# Patient Record
Sex: Male | Born: 1935 | Race: White | Hispanic: No | Smoking: Former smoker
Health system: Southern US, Community
[De-identification: ages and names within clinical notes are randomized; demographics above are authoritative.]

## PROBLEM LIST (undated history)

## (undated) DIAGNOSIS — J449 Chronic obstructive pulmonary disease, unspecified: Secondary | ICD-10-CM

## (undated) DIAGNOSIS — E785 Hyperlipidemia, unspecified: Secondary | ICD-10-CM

## (undated) DIAGNOSIS — E119 Type 2 diabetes mellitus without complications: Secondary | ICD-10-CM

## (undated) DIAGNOSIS — I10 Essential (primary) hypertension: Secondary | ICD-10-CM

## (undated) HISTORY — PX: HERNIA REPAIR: SHX51

---

## 2000-06-17 DIAGNOSIS — I2510 Atherosclerotic heart disease of native coronary artery without angina pectoris: Secondary | ICD-10-CM | POA: Insufficient documentation

## 2000-06-17 DIAGNOSIS — I10 Essential (primary) hypertension: Secondary | ICD-10-CM | POA: Insufficient documentation

## 2002-06-20 DIAGNOSIS — J309 Allergic rhinitis, unspecified: Secondary | ICD-10-CM | POA: Insufficient documentation

## 2002-06-20 DIAGNOSIS — J45909 Unspecified asthma, uncomplicated: Secondary | ICD-10-CM | POA: Insufficient documentation

## 2002-06-20 DIAGNOSIS — J4540 Moderate persistent asthma, uncomplicated: Secondary | ICD-10-CM | POA: Insufficient documentation

## 2004-02-25 ENCOUNTER — Ambulatory Visit: Payer: Medicare Other | Admitting: Family Medicine

## 2009-08-14 ENCOUNTER — Encounter: Payer: Medicare Other | Admitting: Orthopedic Surgery

## 2009-09-11 ENCOUNTER — Encounter: Payer: Medicare Other | Admitting: Orthopedic Surgery

## 2009-10-11 ENCOUNTER — Encounter: Payer: Medicare Other | Admitting: Orthopedic Surgery

## 2009-11-11 ENCOUNTER — Encounter: Payer: Medicare Other | Admitting: Orthopedic Surgery

## 2011-03-31 DIAGNOSIS — M545 Low back pain, unspecified: Secondary | ICD-10-CM | POA: Insufficient documentation

## 2011-04-10 DIAGNOSIS — E1165 Type 2 diabetes mellitus with hyperglycemia: Secondary | ICD-10-CM | POA: Insufficient documentation

## 2011-04-10 DIAGNOSIS — H52209 Unspecified astigmatism, unspecified eye: Secondary | ICD-10-CM | POA: Insufficient documentation

## 2011-04-10 DIAGNOSIS — IMO0002 Reserved for concepts with insufficient information to code with codable children: Secondary | ICD-10-CM | POA: Insufficient documentation

## 2011-04-10 DIAGNOSIS — H2510 Age-related nuclear cataract, unspecified eye: Secondary | ICD-10-CM | POA: Insufficient documentation

## 2011-05-25 DIAGNOSIS — K5730 Diverticulosis of large intestine without perforation or abscess without bleeding: Secondary | ICD-10-CM | POA: Insufficient documentation

## 2011-05-25 DIAGNOSIS — Z86010 Personal history of colonic polyps: Secondary | ICD-10-CM | POA: Insufficient documentation

## 2011-07-13 DIAGNOSIS — M5414 Radiculopathy, thoracic region: Secondary | ICD-10-CM | POA: Insufficient documentation

## 2011-07-13 DIAGNOSIS — R262 Difficulty in walking, not elsewhere classified: Secondary | ICD-10-CM | POA: Insufficient documentation

## 2011-07-17 DIAGNOSIS — M4712 Other spondylosis with myelopathy, cervical region: Secondary | ICD-10-CM | POA: Insufficient documentation

## 2011-07-27 DIAGNOSIS — M4802 Spinal stenosis, cervical region: Secondary | ICD-10-CM | POA: Insufficient documentation

## 2011-08-25 DIAGNOSIS — E7800 Pure hypercholesterolemia, unspecified: Secondary | ICD-10-CM | POA: Insufficient documentation

## 2011-10-27 DIAGNOSIS — M5412 Radiculopathy, cervical region: Secondary | ICD-10-CM | POA: Insufficient documentation

## 2011-12-10 DIAGNOSIS — M546 Pain in thoracic spine: Secondary | ICD-10-CM | POA: Insufficient documentation

## 2011-12-10 DIAGNOSIS — M47817 Spondylosis without myelopathy or radiculopathy, lumbosacral region: Secondary | ICD-10-CM | POA: Insufficient documentation

## 2012-01-12 DIAGNOSIS — M25559 Pain in unspecified hip: Secondary | ICD-10-CM | POA: Insufficient documentation

## 2012-03-17 DIAGNOSIS — Z981 Arthrodesis status: Secondary | ICD-10-CM | POA: Insufficient documentation

## 2012-03-17 DIAGNOSIS — E669 Obesity, unspecified: Secondary | ICD-10-CM | POA: Insufficient documentation

## 2012-03-17 DIAGNOSIS — M169 Osteoarthritis of hip, unspecified: Secondary | ICD-10-CM | POA: Insufficient documentation

## 2012-03-17 DIAGNOSIS — M48061 Spinal stenosis, lumbar region without neurogenic claudication: Secondary | ICD-10-CM | POA: Insufficient documentation

## 2012-05-10 DIAGNOSIS — Z789 Other specified health status: Secondary | ICD-10-CM | POA: Insufficient documentation

## 2012-05-10 DIAGNOSIS — F329 Major depressive disorder, single episode, unspecified: Secondary | ICD-10-CM | POA: Insufficient documentation

## 2012-05-10 DIAGNOSIS — F32A Depression, unspecified: Secondary | ICD-10-CM | POA: Insufficient documentation

## 2012-05-10 DIAGNOSIS — Z7289 Other problems related to lifestyle: Secondary | ICD-10-CM | POA: Insufficient documentation

## 2012-06-16 DIAGNOSIS — Z961 Presence of intraocular lens: Secondary | ICD-10-CM | POA: Insufficient documentation

## 2012-06-16 DIAGNOSIS — Z9849 Cataract extraction status, unspecified eye: Secondary | ICD-10-CM | POA: Insufficient documentation

## 2012-07-07 DIAGNOSIS — F1290 Cannabis use, unspecified, uncomplicated: Secondary | ICD-10-CM | POA: Insufficient documentation

## 2012-07-07 DIAGNOSIS — M5410 Radiculopathy, site unspecified: Secondary | ICD-10-CM | POA: Insufficient documentation

## 2012-07-07 DIAGNOSIS — F1210 Cannabis abuse, uncomplicated: Secondary | ICD-10-CM | POA: Insufficient documentation

## 2012-07-08 DIAGNOSIS — M47816 Spondylosis without myelopathy or radiculopathy, lumbar region: Secondary | ICD-10-CM | POA: Insufficient documentation

## 2012-08-15 DIAGNOSIS — B351 Tinea unguium: Secondary | ICD-10-CM | POA: Insufficient documentation

## 2012-08-15 DIAGNOSIS — E669 Obesity, unspecified: Secondary | ICD-10-CM | POA: Insufficient documentation

## 2012-08-15 DIAGNOSIS — L602 Onychogryphosis: Secondary | ICD-10-CM | POA: Insufficient documentation

## 2012-08-15 DIAGNOSIS — E1169 Type 2 diabetes mellitus with other specified complication: Secondary | ICD-10-CM | POA: Insufficient documentation

## 2012-09-20 ENCOUNTER — Ambulatory Visit: Payer: Self-pay | Admitting: Family Medicine

## 2012-09-25 DIAGNOSIS — L0390 Cellulitis, unspecified: Secondary | ICD-10-CM | POA: Insufficient documentation

## 2012-09-25 DIAGNOSIS — G4730 Sleep apnea, unspecified: Secondary | ICD-10-CM | POA: Insufficient documentation

## 2012-09-25 DIAGNOSIS — J449 Chronic obstructive pulmonary disease, unspecified: Secondary | ICD-10-CM | POA: Insufficient documentation

## 2012-11-23 DIAGNOSIS — R45851 Suicidal ideations: Secondary | ICD-10-CM | POA: Insufficient documentation

## 2012-11-23 DIAGNOSIS — M791 Myalgia, unspecified site: Secondary | ICD-10-CM | POA: Insufficient documentation

## 2013-01-03 DIAGNOSIS — Z79899 Other long term (current) drug therapy: Secondary | ICD-10-CM | POA: Insufficient documentation

## 2013-01-03 DIAGNOSIS — Z7901 Long term (current) use of anticoagulants: Secondary | ICD-10-CM | POA: Insufficient documentation

## 2013-03-03 ENCOUNTER — Ambulatory Visit: Payer: Self-pay | Admitting: Family Medicine

## 2013-04-02 ENCOUNTER — Ambulatory Visit: Payer: Self-pay | Admitting: Family Medicine

## 2013-05-24 DIAGNOSIS — M79641 Pain in right hand: Secondary | ICD-10-CM | POA: Insufficient documentation

## 2013-05-24 DIAGNOSIS — M79642 Pain in left hand: Secondary | ICD-10-CM

## 2013-07-21 DIAGNOSIS — M25519 Pain in unspecified shoulder: Secondary | ICD-10-CM | POA: Insufficient documentation

## 2013-09-18 DIAGNOSIS — F1190 Opioid use, unspecified, uncomplicated: Secondary | ICD-10-CM | POA: Insufficient documentation

## 2013-11-30 ENCOUNTER — Inpatient Hospital Stay (HOSPITAL_COMMUNITY)
Admission: EM | Admit: 2013-11-30 | Discharge: 2013-12-07 | DRG: 308 | Disposition: A | Discharge status: Home Health | Payer: Medicare Other | Attending: Internal Medicine | Admitting: Internal Medicine

## 2013-11-30 ENCOUNTER — Emergency Department (HOSPITAL_COMMUNITY): Payer: Medicare Other

## 2013-11-30 DIAGNOSIS — I10 Essential (primary) hypertension: Secondary | ICD-10-CM

## 2013-11-30 DIAGNOSIS — R4182 Altered mental status, unspecified: Secondary | ICD-10-CM

## 2013-11-30 DIAGNOSIS — N401 Enlarged prostate with lower urinary tract symptoms: Secondary | ICD-10-CM | POA: Diagnosis present

## 2013-11-30 DIAGNOSIS — G4733 Obstructive sleep apnea (adult) (pediatric): Secondary | ICD-10-CM | POA: Diagnosis present

## 2013-11-30 DIAGNOSIS — D649 Anemia, unspecified: Secondary | ICD-10-CM | POA: Diagnosis present

## 2013-11-30 DIAGNOSIS — G9341 Metabolic encephalopathy: Secondary | ICD-10-CM | POA: Diagnosis present

## 2013-11-30 DIAGNOSIS — J4489 Other specified chronic obstructive pulmonary disease: Secondary | ICD-10-CM | POA: Diagnosis present

## 2013-11-30 DIAGNOSIS — N138 Other obstructive and reflux uropathy: Secondary | ICD-10-CM | POA: Diagnosis present

## 2013-11-30 DIAGNOSIS — E119 Type 2 diabetes mellitus without complications: Secondary | ICD-10-CM | POA: Diagnosis present

## 2013-11-30 DIAGNOSIS — F0390 Unspecified dementia without behavioral disturbance: Secondary | ICD-10-CM | POA: Diagnosis present

## 2013-11-30 DIAGNOSIS — R0902 Hypoxemia: Secondary | ICD-10-CM

## 2013-11-30 DIAGNOSIS — R32 Unspecified urinary incontinence: Secondary | ICD-10-CM | POA: Diagnosis present

## 2013-11-30 DIAGNOSIS — IMO0001 Reserved for inherently not codable concepts without codable children: Secondary | ICD-10-CM | POA: Diagnosis present

## 2013-11-30 DIAGNOSIS — I509 Heart failure, unspecified: Secondary | ICD-10-CM | POA: Diagnosis not present

## 2013-11-30 DIAGNOSIS — E785 Hyperlipidemia, unspecified: Secondary | ICD-10-CM | POA: Diagnosis present

## 2013-11-30 DIAGNOSIS — R05 Cough: Secondary | ICD-10-CM

## 2013-11-30 DIAGNOSIS — I472 Ventricular tachycardia, unspecified: Principal | ICD-10-CM | POA: Diagnosis present

## 2013-11-30 DIAGNOSIS — E1139 Type 2 diabetes mellitus with other diabetic ophthalmic complication: Secondary | ICD-10-CM

## 2013-11-30 DIAGNOSIS — I5033 Acute on chronic diastolic (congestive) heart failure: Secondary | ICD-10-CM

## 2013-11-30 DIAGNOSIS — E1165 Type 2 diabetes mellitus with hyperglycemia: Secondary | ICD-10-CM

## 2013-11-30 DIAGNOSIS — I5031 Acute diastolic (congestive) heart failure: Secondary | ICD-10-CM

## 2013-11-30 DIAGNOSIS — R509 Fever, unspecified: Secondary | ICD-10-CM

## 2013-11-30 DIAGNOSIS — M6282 Rhabdomyolysis: Secondary | ICD-10-CM

## 2013-11-30 DIAGNOSIS — Z6836 Body mass index (BMI) 36.0-36.9, adult: Secondary | ICD-10-CM

## 2013-11-30 DIAGNOSIS — D696 Thrombocytopenia, unspecified: Secondary | ICD-10-CM | POA: Diagnosis present

## 2013-11-30 DIAGNOSIS — E662 Morbid (severe) obesity with alveolar hypoventilation: Secondary | ICD-10-CM | POA: Diagnosis present

## 2013-11-30 DIAGNOSIS — I4729 Other ventricular tachycardia: Secondary | ICD-10-CM | POA: Diagnosis not present

## 2013-11-30 DIAGNOSIS — J449 Chronic obstructive pulmonary disease, unspecified: Secondary | ICD-10-CM | POA: Diagnosis present

## 2013-11-30 DIAGNOSIS — E876 Hypokalemia: Secondary | ICD-10-CM

## 2013-11-30 DIAGNOSIS — R6510 Systemic inflammatory response syndrome (SIRS) of non-infectious origin without acute organ dysfunction: Secondary | ICD-10-CM

## 2013-11-30 DIAGNOSIS — Z791 Long term (current) use of non-steroidal anti-inflammatories (NSAID): Secondary | ICD-10-CM

## 2013-11-30 DIAGNOSIS — Z794 Long term (current) use of insulin: Secondary | ICD-10-CM

## 2013-11-30 DIAGNOSIS — I499 Cardiac arrhythmia, unspecified: Secondary | ICD-10-CM

## 2013-11-30 DIAGNOSIS — R059 Cough, unspecified: Secondary | ICD-10-CM

## 2013-11-30 HISTORY — DX: Hyperlipidemia, unspecified: E78.5

## 2013-11-30 HISTORY — DX: Chronic obstructive pulmonary disease, unspecified: J44.9

## 2013-11-30 HISTORY — DX: Essential (primary) hypertension: I10

## 2013-11-30 HISTORY — DX: Type 2 diabetes mellitus without complications: E11.9

## 2013-11-30 LAB — URINALYSIS, ROUTINE W REFLEX MICROSCOPIC
Bilirubin Urine: NEGATIVE
Glucose, UA: NEGATIVE mg/dL
Ketones, ur: NEGATIVE mg/dL
LEUKOCYTES UA: NEGATIVE
NITRITE: NEGATIVE
Protein, ur: 30 mg/dL — AB
SPECIFIC GRAVITY, URINE: 1.016 (ref 1.005–1.030)
UROBILINOGEN UA: 0.2 mg/dL (ref 0.0–1.0)
pH: 7 (ref 5.0–8.0)

## 2013-11-30 LAB — COMPREHENSIVE METABOLIC PANEL
ALBUMIN: 3.6 g/dL (ref 3.5–5.2)
ALK PHOS: 83 U/L (ref 39–117)
ALT: 18 U/L (ref 0–53)
AST: 42 U/L — AB (ref 0–37)
Anion gap: 12 (ref 5–15)
BUN: 14 mg/dL (ref 6–23)
CO2: 28 mEq/L (ref 19–32)
Calcium: 9 mg/dL (ref 8.4–10.5)
Chloride: 100 mEq/L (ref 96–112)
Creatinine, Ser: 1.04 mg/dL (ref 0.50–1.35)
GFR calc Af Amer: 79 mL/min — ABNORMAL LOW (ref >90)
GFR calc non Af Amer: 68 mL/min — ABNORMAL LOW (ref >90)
GLUCOSE: 149 mg/dL — AB (ref 70–99)
POTASSIUM: 2.8 meq/L — AB (ref 3.7–5.3)
Sodium: 140 mEq/L (ref 137–147)
TOTAL PROTEIN: 6.8 g/dL (ref 6.0–8.3)
Total Bilirubin: 0.6 mg/dL (ref 0.3–1.2)

## 2013-11-30 LAB — I-STAT ARTERIAL BLOOD GAS, ED
Acid-Base Excess: 3 mmol/L — ABNORMAL HIGH (ref 0.0–2.0)
Bicarbonate: 26.6 mEq/L — ABNORMAL HIGH (ref 20.0–24.0)
O2 Saturation: 89 %
PH ART: 7.468 — AB (ref 7.350–7.450)
PO2 ART: 54 mmHg — AB (ref 80.0–100.0)
TCO2: 28 mmol/L (ref 0–100)
pCO2 arterial: 36.7 mmHg (ref 35.0–45.0)

## 2013-11-30 LAB — URINE MICROSCOPIC-ADD ON

## 2013-11-30 LAB — CBC WITH DIFFERENTIAL/PLATELET
BASOS PCT: 0 % (ref 0–1)
Basophils Absolute: 0 10*3/uL (ref 0.0–0.1)
Eosinophils Absolute: 0 10*3/uL (ref 0.0–0.7)
Eosinophils Relative: 0 % (ref 0–5)
HCT: 40.1 % (ref 39.0–52.0)
Hemoglobin: 13.1 g/dL (ref 13.0–17.0)
LYMPHS ABS: 0.8 10*3/uL (ref 0.7–4.0)
Lymphocytes Relative: 6 % — ABNORMAL LOW (ref 12–46)
MCH: 30.4 pg (ref 26.0–34.0)
MCHC: 32.7 g/dL (ref 30.0–36.0)
MCV: 93 fL (ref 78.0–100.0)
Monocytes Absolute: 0.4 10*3/uL (ref 0.1–1.0)
Monocytes Relative: 3 % (ref 3–12)
NEUTROS PCT: 91 % — AB (ref 43–77)
Neutro Abs: 12.1 10*3/uL — ABNORMAL HIGH (ref 1.7–7.7)
PLATELETS: 123 10*3/uL — AB (ref 150–400)
RBC: 4.31 MIL/uL (ref 4.22–5.81)
RDW: 13.9 % (ref 11.5–15.5)
WBC: 13.3 10*3/uL — ABNORMAL HIGH (ref 4.0–10.5)

## 2013-11-30 LAB — RAPID URINE DRUG SCREEN, HOSP PERFORMED
Amphetamines: NOT DETECTED
BARBITURATES: NOT DETECTED
Benzodiazepines: NOT DETECTED
COCAINE: NOT DETECTED
Opiates: NOT DETECTED
Tetrahydrocannabinol: NOT DETECTED

## 2013-11-30 LAB — CK: Total CK: 1384 U/L — ABNORMAL HIGH (ref 7–232)

## 2013-11-30 LAB — ETHANOL: Alcohol, Ethyl (B): <11 mg/dL (ref 0–11)

## 2013-11-30 LAB — AMMONIA: Ammonia: 33 umol/L (ref 11–60)

## 2013-11-30 LAB — ACETAMINOPHEN LEVEL: Acetaminophen (Tylenol), Serum: <15 ug/mL (ref 10–30)

## 2013-11-30 LAB — I-STAT TROPONIN, ED: Troponin i, poc: 0.01 ng/mL (ref 0.00–0.08)

## 2013-11-30 LAB — I-STAT CG4 LACTIC ACID, ED: LACTIC ACID, VENOUS: 0.96 mmol/L (ref 0.5–2.2)

## 2013-11-30 MED ORDER — SODIUM CHLORIDE 0.9 % IV BOLUS (SEPSIS)
30.0000 mL/kg | Freq: Once | INTRAVENOUS | Status: AC
  Administered 2013-11-30: 500 mL via INTRAVENOUS

## 2013-11-30 MED ORDER — SODIUM CHLORIDE 0.9 % IV BOLUS (SEPSIS)
500.0000 mL | Freq: Once | INTRAVENOUS | Status: AC
  Administered 2013-11-30: 500 mL via INTRAVENOUS

## 2013-11-30 MED ORDER — ACETAMINOPHEN 650 MG RE SUPP
650.0000 mg | Freq: Once | RECTAL | Status: AC
  Administered 2013-11-30: 650 mg via RECTAL
  Filled 2013-11-30: qty 1

## 2013-11-30 MED ORDER — ALBUTEROL SULFATE (2.5 MG/3ML) 0.083% IN NEBU
5.0000 mg | INHALATION_SOLUTION | Freq: Once | RESPIRATORY_TRACT | Status: AC
  Administered 2013-12-01: 5 mg via RESPIRATORY_TRACT
  Filled 2013-11-30: qty 6

## 2013-11-30 MED ORDER — SODIUM CHLORIDE 0.9 % IV SOLN
1000.0000 mL | INTRAVENOUS | Status: DC
  Administered 2013-11-30: 1000 mL via INTRAVENOUS

## 2013-11-30 MED ORDER — POTASSIUM CHLORIDE 10 MEQ/100ML IV SOLN
10.0000 meq | INTRAVENOUS | Status: AC
  Administered 2013-11-30 – 2013-12-01 (×2): 10 meq via INTRAVENOUS
  Filled 2013-11-30 (×2): qty 100

## 2013-11-30 NOTE — ED Provider Notes (Signed)
CSN: [id number]     Arrival date & time 11/30/13  2047 History   First MD Initiated Contact with Patient 11/30/13 2057     Chief Complaint  Patient presents with  . Altered Mental Status     (Consider location/radiation/quality/duration/timing/severity/associated sxs/prior Treatment) HPI Samuel Thomas is a 78 y.o. male who presents emergency department by EMS complaining of confusion. Patient apparently lives in [place], he's not sure what he is doing in [place]. He was found on the side of the right knee or his car confused by bystanders. Patient apparently did urinate on himself. Patient was too weak to get up or walk. He was disoriented. He appeared to be warm to touch and not sweating. Patient appears to be very somnolent but is arousable to verbal stimuli. He does not have any complaints.  No past medical history on file. No past surgical history on file. No family history on file. History  Substance Use Topics  . Smoking status: Not on file  . Smokeless tobacco: Not on file  . Alcohol Use: Not on file    Review of Systems  Constitutional: Negative for fever and chills.  HENT: Negative for congestion and sore throat.   Respiratory: Positive for cough. Negative for chest tightness and shortness of breath.   Cardiovascular: Negative for chest pain, palpitations and leg swelling.  Gastrointestinal: Negative for nausea, vomiting, abdominal pain, diarrhea and abdominal distention.  Genitourinary: Negative for dysuria, urgency, frequency and hematuria.  Musculoskeletal: Negative for arthralgias, myalgias, neck pain and neck stiffness.  Skin: Negative for rash.  Allergic/Immunologic: Negative for immunocompromised state.  Neurological: Negative for dizziness, weakness, light-headedness, numbness and headaches.  All other systems reviewed and are negative.     Allergies  Review of patient's allergies indicates not on file.  Home Medications   Prior to Admission  medications   Not on File   BP 142/49  Pulse 101  Temp(Src) 101.5 F (38.6 C) (Oral)  Resp 27  SpO2 95% Physical Exam  Nursing note and vitals reviewed. Constitutional: He appears well-developed and well-nourished. No distress.  HENT:  Head: Normocephalic and atraumatic.  Right Ear: External ear normal.  Left Ear: External ear normal.  Nose: Nose normal.  Mouth/Throat: Oropharynx is clear and moist.  Eyes: Conjunctivae and EOM are normal. Pupils are equal, round, and reactive to light.  Bilateral purulent eye drainage  Neck: Normal range of motion. Neck supple.  No meningismus  Cardiovascular: Normal rate, regular rhythm and normal heart sounds.   Pulmonary/Chest: Effort normal. No respiratory distress. He has wheezes. He has no rales.  Mild expiratory wheezes noted  Abdominal: Soft. Bowel sounds are normal. He exhibits no distension. There is no tenderness. There is no rebound.  Musculoskeletal: He exhibits no edema.  Neurological: He is alert. No cranial nerve deficit. Coordination normal.  Oriented to self only. Somnolent. 5/5 and equal upper and lower extremity strength bilaterally. Equal grip strength bilaterally. Normal finger to nose and heel to shin. No pronator drift. Patellar reflexes 2+   Skin: Skin is warm and dry.    ED Course  Procedures (including critical care time) Labs Review Labs Reviewed  CBC WITH DIFFERENTIAL - Abnormal; Notable for the following:    WBC 13.3 (*)    Platelets 123 (*)    Neutrophils Relative % 91 (*)    Neutro Abs 12.1 (*)    Lymphocytes Relative 6 (*)    All other components within normal limits  COMPREHENSIVE METABOLIC PANEL - Abnormal; Notable  for the following:    Potassium 2.8 (*)    Glucose, Bld 149 (*)    AST 42 (*)    GFR calc non Af Amer 68 (*)    GFR calc Af Amer 79 (*)    All other components within normal limits  URINALYSIS, ROUTINE W REFLEX MICROSCOPIC - Abnormal; Notable for the following:    Hgb urine dipstick  SMALL (*)    Protein, ur 30 (*)    All other components within normal limits  CK - Abnormal; Notable for the following:    Total CK 1384 (*)    All other components within normal limits  I-STAT ARTERIAL BLOOD GAS, ED - Abnormal; Notable for the following:    pH, Arterial 7.468 (*)    pO2, Arterial 54.0 (*)    Bicarbonate 26.6 (*)    Acid-Base Excess 3.0 (*)    All other components within normal limits  CULTURE, BLOOD (ROUTINE X 2)  CULTURE, BLOOD (ROUTINE X 2)  AMMONIA  URINE RAPID DRUG SCREEN (HOSP PERFORMED)  ETHANOL  ACETAMINOPHEN LEVEL  URINE MICROSCOPIC-ADD ON  MAGNESIUM  I-STAT TROPOININ, ED  I-STAT CG4 LACTIC ACID, ED    Imaging Review Ct Head Wo Contrast  12/01/2013   CLINICAL DATA:  Confusion.  Altered mental status.  EXAM: CT HEAD WITHOUT CONTRAST  TECHNIQUE: Contiguous axial images were obtained from the base of the skull through the vertex without intravenous contrast.  COMPARISON:  None.  FINDINGS: Stable age related cerebral atrophy, ventriculomegaly and periventricular white matter disease. No extra-axial fluid collections are identified. No CT findings for acute hemispheric infarction or intracranial hemorrhage. No mass lesions. The brainstem and cerebellum are normal.  No acute bony findings. Scattered ethmoid sinus disease. The globes are intact.  IMPRESSION: Age related cerebral atrophy, ventriculomegaly and periventricular white matter disease.  No acute intracranial findings or mass lesions.   Electronically Signed   By: Loralie Champagne M.D.   On: 12/01/2013 00:30   Dg Chest Portable 1 View  11/30/2013   CLINICAL DATA:  Confusion and weakness.  EXAM: PORTABLE CHEST - 1 VIEW  COMPARISON:  None.  FINDINGS: Heart size is upper limits of normal. Lungs are clear but difficult to evaluate the left lung base. Atherosclerotic calcifications at the aortic arch. Surgical plate in the cervical spine.  IMPRESSION: No acute chest abnormality but limited evaluation of the left  lung base.   Electronically Signed   By: Richarda Overlie M.D.   On: 11/30/2013 22:20    Date: 12/01/2013  Rate: 100  Rhythm: sinus tachycardia  QRS Axis: normal  Intervals: normal  ST/T Wave abnormalities: nonspecific T wave changes  Conduction Disutrbances:none  Narrative Interpretation:   Old EKG Reviewed: none available    MDM   Final diagnoses:  Altered mental status, unspecified altered mental status type  Hypokalemia  Fever, unspecified fever cause  Cardiac arrhythmia, unspecified cardiac arrhythmia type    Pt presented to ED very solmnolent, hot to the touch, tachypnec. Evidence of urinary incontinence. ahs been sitting outside int he heat for unknown time. Will get labs, CXR, fluids started.   Temp rectally 103, tachycardic, sepsis work up started. BP normal. Oxygen sat normal. Tylenol ordered for fever. Pt has no meningismus on exam. No clear source of fever. Question hyperthermia from being outside? CK total ordered.    1:00 AM Pt with negative work up except for potassium of 2.8. Pt is more awake, oriented x3, continues to have some somnolence. His temperature  improved. Will need admission for further evaluation. Spoke with [facility] hospitalist will admit. Pt is wheezing, albuterol neb ordered.   Hospitalist noted frequent short runs of vtach? During stay. Will need further monitoring.       Lottie Musselatyana A Juwann Sherk, PA-C 12/02/13 0011

## 2013-11-30 NOTE — Progress Notes (Addendum)
RT obtained ABG from pt while on room air. Showed results to pt's nurse, with PO2 of 54 agreed with nurse to place on oxygen.

## 2013-11-30 NOTE — ED Notes (Signed)
Patient found outside of his car confused.. Urinated on himself.  Bystanders got the patient into his car.  EMS found patient confused and weak.  Patient from [place] but does not know why he is in [place]. Patient hot to touch and not sweating

## 2013-12-01 ENCOUNTER — Emergency Department (HOSPITAL_COMMUNITY): Payer: Medicare Other

## 2013-12-01 ENCOUNTER — Encounter (HOSPITAL_COMMUNITY): Payer: Self-pay | Admitting: Internal Medicine

## 2013-12-01 DIAGNOSIS — Z6836 Body mass index (BMI) 36.0-36.9, adult: Secondary | ICD-10-CM | POA: Diagnosis not present

## 2013-12-01 DIAGNOSIS — I10 Essential (primary) hypertension: Secondary | ICD-10-CM | POA: Diagnosis present

## 2013-12-01 DIAGNOSIS — I5033 Acute on chronic diastolic (congestive) heart failure: Secondary | ICD-10-CM | POA: Diagnosis not present

## 2013-12-01 DIAGNOSIS — I472 Ventricular tachycardia: Secondary | ICD-10-CM | POA: Diagnosis present

## 2013-12-01 DIAGNOSIS — R6510 Systemic inflammatory response syndrome (SIRS) of non-infectious origin without acute organ dysfunction: Secondary | ICD-10-CM | POA: Diagnosis present

## 2013-12-01 DIAGNOSIS — I4729 Other ventricular tachycardia: Secondary | ICD-10-CM | POA: Diagnosis present

## 2013-12-01 DIAGNOSIS — E119 Type 2 diabetes mellitus without complications: Secondary | ICD-10-CM | POA: Diagnosis present

## 2013-12-01 DIAGNOSIS — J449 Chronic obstructive pulmonary disease, unspecified: Secondary | ICD-10-CM | POA: Diagnosis present

## 2013-12-01 DIAGNOSIS — D696 Thrombocytopenia, unspecified: Secondary | ICD-10-CM | POA: Diagnosis present

## 2013-12-01 DIAGNOSIS — R0902 Hypoxemia: Secondary | ICD-10-CM

## 2013-12-01 DIAGNOSIS — N138 Other obstructive and reflux uropathy: Secondary | ICD-10-CM | POA: Diagnosis present

## 2013-12-01 DIAGNOSIS — R4182 Altered mental status, unspecified: Secondary | ICD-10-CM | POA: Diagnosis present

## 2013-12-01 DIAGNOSIS — R000 Tachycardia, unspecified: Secondary | ICD-10-CM

## 2013-12-01 DIAGNOSIS — G9341 Metabolic encephalopathy: Secondary | ICD-10-CM | POA: Diagnosis present

## 2013-12-01 DIAGNOSIS — F0390 Unspecified dementia without behavioral disturbance: Secondary | ICD-10-CM | POA: Diagnosis present

## 2013-12-01 DIAGNOSIS — E876 Hypokalemia: Secondary | ICD-10-CM | POA: Diagnosis present

## 2013-12-01 DIAGNOSIS — G4733 Obstructive sleep apnea (adult) (pediatric): Secondary | ICD-10-CM | POA: Diagnosis present

## 2013-12-01 DIAGNOSIS — D649 Anemia, unspecified: Secondary | ICD-10-CM | POA: Diagnosis present

## 2013-12-01 DIAGNOSIS — M6282 Rhabdomyolysis: Secondary | ICD-10-CM | POA: Diagnosis present

## 2013-12-01 DIAGNOSIS — Z791 Long term (current) use of non-steroidal anti-inflammatories (NSAID): Secondary | ICD-10-CM | POA: Diagnosis not present

## 2013-12-01 DIAGNOSIS — IMO0001 Reserved for inherently not codable concepts without codable children: Secondary | ICD-10-CM | POA: Diagnosis present

## 2013-12-01 DIAGNOSIS — E662 Morbid (severe) obesity with alveolar hypoventilation: Secondary | ICD-10-CM | POA: Diagnosis present

## 2013-12-01 DIAGNOSIS — Z794 Long term (current) use of insulin: Secondary | ICD-10-CM | POA: Diagnosis not present

## 2013-12-01 DIAGNOSIS — I509 Heart failure, unspecified: Secondary | ICD-10-CM | POA: Diagnosis not present

## 2013-12-01 DIAGNOSIS — E785 Hyperlipidemia, unspecified: Secondary | ICD-10-CM | POA: Diagnosis present

## 2013-12-01 DIAGNOSIS — N401 Enlarged prostate with lower urinary tract symptoms: Secondary | ICD-10-CM | POA: Diagnosis present

## 2013-12-01 DIAGNOSIS — R32 Unspecified urinary incontinence: Secondary | ICD-10-CM | POA: Diagnosis present

## 2013-12-01 LAB — URINE MICROSCOPIC-ADD ON

## 2013-12-01 LAB — TSH: TSH: 0.602 u[IU]/mL (ref 0.350–4.500)

## 2013-12-01 LAB — URINALYSIS, ROUTINE W REFLEX MICROSCOPIC
Glucose, UA: NEGATIVE mg/dL
HGB URINE DIPSTICK: NEGATIVE
Ketones, ur: 15 mg/dL — AB
Leukocytes, UA: NEGATIVE
NITRITE: NEGATIVE
Protein, ur: 30 mg/dL — AB
SPECIFIC GRAVITY, URINE: 1.029 (ref 1.005–1.030)
Urobilinogen, UA: 0.2 mg/dL (ref 0.0–1.0)
pH: 5.5 (ref 5.0–8.0)

## 2013-12-01 LAB — CBC
HEMATOCRIT: 36.6 % — AB (ref 39.0–52.0)
Hemoglobin: 12.2 g/dL — ABNORMAL LOW (ref 13.0–17.0)
MCH: 30.3 pg (ref 26.0–34.0)
MCHC: 33.3 g/dL (ref 30.0–36.0)
MCV: 91 fL (ref 78.0–100.0)
PLATELETS: 124 10*3/uL — AB (ref 150–400)
RBC: 4.02 MIL/uL — ABNORMAL LOW (ref 4.22–5.81)
RDW: 14 % (ref 11.5–15.5)
WBC: 12 10*3/uL — ABNORMAL HIGH (ref 4.0–10.5)

## 2013-12-01 LAB — BASIC METABOLIC PANEL
Anion gap: 9 (ref 5–15)
Anion gap: 14 (ref 5–15)
BUN: 12 mg/dL (ref 6–23)
BUN: 15 mg/dL (ref 6–23)
CALCIUM: 8.3 mg/dL — AB (ref 8.4–10.5)
CO2: 26 mEq/L (ref 19–32)
CO2: 24 meq/L (ref 19–32)
CREATININE: 0.96 mg/dL (ref 0.50–1.35)
CREATININE: 0.88 mg/dL (ref 0.50–1.35)
Calcium: 8.6 mg/dL (ref 8.4–10.5)
Chloride: 100 mEq/L (ref 96–112)
Chloride: 101 mEq/L (ref 96–112)
GFR calc Af Amer: >90 mL/min (ref >90)
GFR calc non Af Amer: 79 mL/min — ABNORMAL LOW (ref >90)
GFR calc non Af Amer: 82 mL/min — ABNORMAL LOW (ref >90)
GLUCOSE: 219 mg/dL — AB (ref 70–99)
Glucose, Bld: 136 mg/dL — ABNORMAL HIGH (ref 70–99)
Potassium: 5.5 mEq/L — ABNORMAL HIGH (ref 3.7–5.3)
Potassium: 3.3 mEq/L — ABNORMAL LOW (ref 3.7–5.3)
Sodium: 135 mEq/L — ABNORMAL LOW (ref 137–147)
Sodium: 139 mEq/L (ref 137–147)

## 2013-12-01 LAB — TROPONIN I
Troponin I: <0.3 ng/mL (ref <0.30)
Troponin I: <0.3 ng/mL (ref <0.30)
Troponin I: <0.3 ng/mL (ref <0.30)

## 2013-12-01 LAB — I-STAT TROPONIN, ED: Troponin i, poc: 0.01 ng/mL (ref 0.00–0.08)

## 2013-12-01 LAB — I-STAT CHEM 8, ED
BUN: 16 mg/dL (ref 6–23)
Calcium, Ion: 1.06 mmol/L — ABNORMAL LOW (ref 1.13–1.30)
Chloride: 101 mEq/L (ref 96–112)
Creatinine, Ser: 1 mg/dL (ref 0.50–1.35)
Glucose, Bld: 140 mg/dL — ABNORMAL HIGH (ref 70–99)
HCT: 38 % — ABNORMAL LOW (ref 39.0–52.0)
Hemoglobin: 12.9 g/dL — ABNORMAL LOW (ref 13.0–17.0)
Potassium: 4.1 mEq/L (ref 3.7–5.3)
Sodium: 137 mEq/L (ref 137–147)
TCO2: 27 mmol/L (ref 0–100)

## 2013-12-01 LAB — GLUCOSE, CAPILLARY
GLUCOSE-CAPILLARY: 198 mg/dL — AB (ref 70–99)
Glucose-Capillary: 144 mg/dL — ABNORMAL HIGH (ref 70–99)
Glucose-Capillary: 229 mg/dL — ABNORMAL HIGH (ref 70–99)

## 2013-12-01 LAB — CK TOTAL AND CKMB (NOT AT ARMC)
CK, MB: 3.2 ng/mL (ref 0.3–4.0)
RELATIVE INDEX: 0.3 (ref 0.0–2.5)
Total CK: 1163 U/L — ABNORMAL HIGH (ref 7–232)

## 2013-12-01 LAB — MAGNESIUM: Magnesium: 2.1 mg/dL (ref 1.5–2.5)

## 2013-12-01 LAB — MRSA PCR SCREENING: MRSA by PCR: NEGATIVE

## 2013-12-01 LAB — CBG MONITORING, ED: Glucose-Capillary: 139 mg/dL — ABNORMAL HIGH (ref 70–99)

## 2013-12-01 LAB — CK: CK TOTAL: 623 U/L — AB (ref 7–232)

## 2013-12-01 MED ORDER — FLUOXETINE HCL 20 MG PO CAPS
20.0000 mg | ORAL_CAPSULE | Freq: Every day | ORAL | Status: DC
  Administered 2013-12-01 – 2013-12-07 (×7): 20 mg via ORAL
  Filled 2013-12-01 (×7): qty 1

## 2013-12-01 MED ORDER — FLUTICASONE PROPIONATE HFA 44 MCG/ACT IN AERO
1.0000 | INHALATION_SPRAY | Freq: Two times a day (BID) | RESPIRATORY_TRACT | Status: DC
  Administered 2013-12-01 – 2013-12-07 (×12): 1 via RESPIRATORY_TRACT
  Filled 2013-12-01: qty 10.6

## 2013-12-01 MED ORDER — INSULIN ASPART 100 UNIT/ML ~~LOC~~ SOLN
0.0000 [IU] | Freq: Three times a day (TID) | SUBCUTANEOUS | Status: DC
  Administered 2013-12-01: 2 [IU] via SUBCUTANEOUS
  Administered 2013-12-01: 5 [IU] via SUBCUTANEOUS
  Administered 2013-12-02: 3 [IU] via SUBCUTANEOUS

## 2013-12-01 MED ORDER — ACETAMINOPHEN 325 MG PO TABS
650.0000 mg | ORAL_TABLET | Freq: Four times a day (QID) | ORAL | Status: DC | PRN
  Administered 2013-12-01 – 2013-12-06 (×5): 650 mg via ORAL
  Filled 2013-12-01 (×5): qty 2

## 2013-12-01 MED ORDER — HEPARIN SODIUM (PORCINE) 5000 UNIT/ML IJ SOLN
5000.0000 [IU] | Freq: Three times a day (TID) | INTRAMUSCULAR | Status: DC
  Administered 2013-12-01 – 2013-12-07 (×19): 5000 [IU] via SUBCUTANEOUS
  Filled 2013-12-01 (×23): qty 1

## 2013-12-01 MED ORDER — GABAPENTIN 600 MG PO TABS
600.0000 mg | ORAL_TABLET | Freq: Three times a day (TID) | ORAL | Status: DC
  Administered 2013-12-01 – 2013-12-07 (×19): 600 mg via ORAL
  Filled 2013-12-01 (×22): qty 1

## 2013-12-01 MED ORDER — NIFEDIPINE ER OSMOTIC RELEASE 90 MG PO TB24
90.0000 mg | ORAL_TABLET | Freq: Every day | ORAL | Status: DC
  Administered 2013-12-01 – 2013-12-07 (×7): 90 mg via ORAL
  Filled 2013-12-01 (×7): qty 1

## 2013-12-01 MED ORDER — LORATADINE 10 MG PO TABS
10.0000 mg | ORAL_TABLET | Freq: Every day | ORAL | Status: DC
  Administered 2013-12-01 – 2013-12-07 (×7): 10 mg via ORAL
  Filled 2013-12-01 (×7): qty 1

## 2013-12-01 MED ORDER — SODIUM CHLORIDE 0.9 % IV SOLN
1000.0000 mL | INTRAVENOUS | Status: DC
  Administered 2013-12-01: 1000 mL via INTRAVENOUS

## 2013-12-01 MED ORDER — FLUTICASONE PROPIONATE 50 MCG/ACT NA SUSP
1.0000 | Freq: Every day | NASAL | Status: DC
  Administered 2013-12-03: 1 via NASAL
  Administered 2013-12-04 – 2013-12-05 (×2): 2 via NASAL
  Administered 2013-12-06 – 2013-12-07 (×2): 1 via NASAL
  Filled 2013-12-01 (×2): qty 16

## 2013-12-01 MED ORDER — TAMSULOSIN HCL 0.4 MG PO CAPS
0.4000 mg | ORAL_CAPSULE | Freq: Every day | ORAL | Status: DC
  Administered 2013-12-02 – 2013-12-07 (×6): 0.4 mg via ORAL
  Filled 2013-12-01 (×7): qty 1

## 2013-12-01 MED ORDER — LOSARTAN POTASSIUM 50 MG PO TABS
100.0000 mg | ORAL_TABLET | Freq: Every day | ORAL | Status: DC
  Administered 2013-12-01 – 2013-12-07 (×7): 100 mg via ORAL
  Filled 2013-12-01 (×7): qty 2

## 2013-12-01 MED ORDER — SODIUM CHLORIDE 0.9 % IJ SOLN
3.0000 mL | Freq: Two times a day (BID) | INTRAMUSCULAR | Status: DC
  Administered 2013-12-01 – 2013-12-07 (×10): 3 mL via INTRAVENOUS

## 2013-12-01 MED ORDER — PIPERACILLIN-TAZOBACTAM 3.375 G IVPB
3.3750 g | Freq: Three times a day (TID) | INTRAVENOUS | Status: DC
  Administered 2013-12-01 – 2013-12-03 (×7): 3.375 g via INTRAVENOUS
  Filled 2013-12-01 (×8): qty 50

## 2013-12-01 MED ORDER — LEVALBUTEROL TARTRATE 45 MCG/ACT IN AERO: 1.0000 | INHALATION_SPRAY | RESPIRATORY_TRACT | Status: DC | PRN

## 2013-12-01 MED ORDER — BACLOFEN 10 MG PO TABS
10.0000 mg | ORAL_TABLET | Freq: Every day | ORAL | Status: DC
  Administered 2013-12-01 – 2013-12-07 (×7): 10 mg via ORAL
  Filled 2013-12-01 (×7): qty 1

## 2013-12-01 MED ORDER — INSULIN GLARGINE 100 UNIT/ML ~~LOC~~ SOLN
25.0000 [IU] | Freq: Two times a day (BID) | SUBCUTANEOUS | Status: DC
  Administered 2013-12-02 – 2013-12-07 (×11): 25 [IU] via SUBCUTANEOUS
  Filled 2013-12-01 (×12): qty 0.25

## 2013-12-01 MED ORDER — ALBUTEROL SULFATE (2.5 MG/3ML) 0.083% IN NEBU
5.0000 mg | INHALATION_SOLUTION | Freq: Once | RESPIRATORY_TRACT | Status: DC
  Filled 2013-12-01: qty 6

## 2013-12-01 MED ORDER — SODIUM CHLORIDE 0.9 % IV SOLN: INTRAVENOUS | Status: DC

## 2013-12-01 MED ORDER — VANCOMYCIN HCL 10 G IV SOLR
1500.0000 mg | Freq: Once | INTRAVENOUS | Status: AC
  Administered 2013-12-01: 1500 mg via INTRAVENOUS
  Filled 2013-12-01: qty 1500

## 2013-12-01 MED ORDER — VANCOMYCIN HCL IN DEXTROSE 750-5 MG/150ML-% IV SOLN
750.0000 mg | Freq: Two times a day (BID) | INTRAVENOUS | Status: DC
  Administered 2013-12-01 – 2013-12-03 (×4): 750 mg via INTRAVENOUS
  Filled 2013-12-01 (×6): qty 150

## 2013-12-01 MED ORDER — INSULIN ASPART 100 UNIT/ML ~~LOC~~ SOLN: 0.0000 [IU] | SUBCUTANEOUS | Status: DC

## 2013-12-01 MED ORDER — ATORVASTATIN CALCIUM 20 MG PO TABS
20.0000 mg | ORAL_TABLET | Freq: Every day | ORAL | Status: DC
  Filled 2013-12-01: qty 1

## 2013-12-01 MED ORDER — INSULIN DETEMIR 100 UNIT/ML ~~LOC~~ SOLN: 25.0000 [IU] | Freq: Two times a day (BID) | SUBCUTANEOUS | Status: DC

## 2013-12-01 MED ORDER — HEPARIN SODIUM (PORCINE) 5000 UNIT/ML IJ SOLN
INTRAMUSCULAR | Status: AC
  Filled 2013-12-01: qty 1

## 2013-12-01 MED ORDER — IPRATROPIUM BROMIDE 0.02 % IN SOLN
0.5000 mg | Freq: Once | RESPIRATORY_TRACT | Status: DC
  Filled 2013-12-01: qty 2.5

## 2013-12-01 MED ORDER — LEVALBUTEROL HCL 0.63 MG/3ML IN NEBU
0.6300 mg | INHALATION_SOLUTION | Freq: Four times a day (QID) | RESPIRATORY_TRACT | Status: DC | PRN
  Administered 2013-12-01 – 2013-12-05 (×8): 0.63 mg via RESPIRATORY_TRACT
  Filled 2013-12-01 (×10): qty 3

## 2013-12-01 MED ORDER — POTASSIUM CHLORIDE 10 MEQ/100ML IV SOLN
10.0000 meq | INTRAVENOUS | Status: AC
  Administered 2013-12-01 (×2): 10 meq via INTRAVENOUS
  Filled 2013-12-01 (×2): qty 100

## 2013-12-01 NOTE — Progress Notes (Signed)
Placed pt. On CPAP auto titrate (min: 4, max: 14) via nasal mask with 2L O2 bled in. Pt. Is tolerating CPAP well at this time without any complications.

## 2013-12-01 NOTE — Progress Notes (Signed)
  Echocardiogram 2D Echocardiogram has been performed.  Arvil ChacoFoster, Kammie Scioli 12/01/2013, 3:51 PM

## 2013-12-01 NOTE — Progress Notes (Signed)
Upon arrival pt was off BiPAP. Taken off by MD according to RN. Pt was on room air SAT was 94%. Vitals were stable. Pt was responding apparently, no distress noted. RT will continue to monitor.

## 2013-12-01 NOTE — ED Notes (Signed)
Pt bed on 2H still dirty at this time.

## 2013-12-01 NOTE — Progress Notes (Signed)
ANTIBIOTIC CONSULT NOTE - INITIAL  Pharmacy Consult for Vancomycin and Zosyn Indication: rule out sepsis  Not on File  Patient Measurements: Height: 5\' 7"  (170.2 cm) Weight: 240 lb (108.863 kg) IBW/kg (Calculated) : 66.1 Adjusted Body Weight: 85 kg  Vital Signs: Temp: 99.2 F (37.3 C) (08/20 2354) Temp src: Oral (08/20 2354) BP: 141/66 mmHg (08/21 0330) Pulse Rate: 94 (08/21 0330) Intake/Output from previous day: 08/20 0701 - 08/21 0700 In: -  Out: 400 [Urine:400] Intake/Output from this shift: Total I/O In: -  Out: 400 [Urine:400]  Labs:  Recent Labs  11/30/13 2107 12/01/13 0226  WBC 13.3*  --   HGB 13.1 12.9*  PLT 123*  --   CREATININE 1.04 1.00   Estimated Creatinine Clearance: 75.1 ml/min (by C-G formula based on Cr of 1). No results found for this basename: VANCOTROUGH, VANCOPEAK, VANCORANDOM, GENTTROUGH, GENTPEAK, GENTRANDOM, TOBRATROUGH, TOBRAPEAK, TOBRARND, AMIKACINPEAK, AMIKACINTROU, AMIKACIN,  in the last 72 hours   Microbiology: No results found for this or any previous visit (from the past 720 hour(s)).  Medical History: Past Medical History  Diagnosis Date  . HTN (hypertension)   . DM2 (diabetes mellitus, type 2)   . HLD (hyperlipidemia)   . COPD (chronic obstructive pulmonary disease)     Medications:  Lipitor  Baclofen  Qvar  Prozac  FLonase  Neurontin  HCTZ  Norco  Novolog  Lantus  Xopenex  Claritin  Cozaar  Metformin  Procardia  KCl  Flomax  Assessment: [age] yo male with AMS/fevers, possible sepsis, for empiric antibiotics  Goal of Therapy:  Vancomycin trough level 15-20 mcg/ml  Plan:  Vancomycin 1500 mg IV now, then 750 mg IV q12h Zosyn 3.375 g IV q8h   Eddie CandleAbbott, Juergen Hardenbrook [place] 12/01/2013,3:36 AM

## 2013-12-01 NOTE — Progress Notes (Signed)
Samuel Thomas,Samuel Thomas patients POA was notified of admission per patient request. POA is out of town at present and can be reached by cell phone if needed. POA states that AMS is a change for patient and that he functions at home and drives. She states that he does drink ETOH on occasions where as patient denies ETOH.

## 2013-12-01 NOTE — ED Notes (Signed)
RN from [facility] called and said pt's bed is not currently clean.

## 2013-12-01 NOTE — ED Notes (Signed)
Pt transported to CT ?

## 2013-12-01 NOTE — Progress Notes (Signed)
Utilization Review Completed.Samuel Thomas T8/21/2015  

## 2013-12-01 NOTE — ED Notes (Signed)
Cardiology and Internal Med at bedside

## 2013-12-01 NOTE — ED Notes (Addendum)
Pt having runs of VTach, longest run at this time has been 14 beats, pt symptomatic at this time, passing out during longer runs. Internal medicine made aware

## 2013-12-01 NOTE — H&P (Addendum)
[facility] History and Physical  VINCE AINSLEY ZOX:[id number] DOB: Sep 10, 1935 DOA: 11/30/2013  Referring physician: EDP PCP: No primary provider on file.   Chief Complaint: AMS   HPI: Samuel Thomas is a 78 y.o. male who is brought in to the ED with confusion.  He is from [place] but was found outside of his car in [place], confused, and had urinated on himself.  Bystanders summoned EMS.  Patient was confused and weak initially, and does not know why he is in [place].  It is unknown how long he was outside in the hot weather outside of his car today.  He was brought to the ED.  In the ED his mental status has slightly improved, but he continues to have some SOB.  Fever has improved without specific treatment, UA was negative, CXR shows pulmonary vascular congestion.  He was noted to be wheezing so was given albuterol, he was also started on potassium replacement for his hypokalemia.  While in the ER the patient has continually gone into progressively longer runs of NSVT, now up to 14 beats.  He continues to have V tach runs very frequently (at least a couple of times a min).  Cardiology has been consulted.  Review of Systems: Systems reviewed.  As above, otherwise negative  Past Medical History  Diagnosis Date  . HTN (hypertension)   . DM2 (diabetes mellitus, type 2)   . HLD (hyperlipidemia)   . COPD (chronic obstructive pulmonary disease)    No past surgical history on file. Social History:  has no tobacco, alcohol, and drug history on file.  Not on File  No family history on file.   Prior to Admission medications   Medication Sig Start Date End Date Taking? Authorizing Provider  atorvastatin (LIPITOR) 20 MG tablet Take 20 mg by mouth at bedtime.   Yes Historical Provider, MD  baclofen (LIORESAL) 10 MG tablet Take 10 mg by mouth as directed.   Yes Historical Provider, MD  beclomethasone (QVAR) 40 MCG/ACT inhaler Inhale 1-2 puffs into the lungs 2 (two) times daily.    Yes Historical Provider, MD  FLUoxetine (PROZAC) 20 MG capsule Take 20 mg by mouth daily.   Yes Historical Provider, MD  fluticasone (FLONASE) 50 MCG/ACT nasal spray Place 1-2 sprays into both nostrils daily.   Yes Historical Provider, MD  gabapentin (NEURONTIN) 600 MG tablet Take 600 mg by mouth 3 (three) times daily.   Yes Historical Provider, MD  hydrochlorothiazide (HYDRODIURIL) 25 MG tablet Take 25 mg by mouth daily.   Yes Historical Provider, MD  HYDROcodone-acetaminophen (NORCO/VICODIN) 5-325 MG per tablet Take 1 tablet by mouth every 6 (six) hours as needed for moderate pain.   Yes Historical Provider, MD  insulin aspart (NOVOLOG) 100 UNIT/ML injection Inject 4 Units into the skin as directed.   Yes Historical Provider, MD  insulin glargine (LANTUS) 100 UNIT/ML injection Inject 25-30 Units into the skin 2 (two) times daily.   Yes Historical Provider, MD  levalbuterol [facility] HFA) 45 MCG/ACT inhaler Inhale into the lungs every 4 (four) hours as needed for wheezing.   Yes Historical Provider, MD  loratadine (CLARITIN) 10 MG tablet Take 10 mg by mouth daily.   Yes Historical Provider, MD  losartan (COZAAR) 100 MG tablet Take 100 mg by mouth daily.   Yes Historical Provider, MD  metFORMIN (GLUCOPHAGE-XR) 500 MG 24 hr tablet Take 1,000 mg by mouth 2 (two) times daily.   Yes Historical Provider, MD  NIFEdipine (PROCARDIA XL/ADALAT-CC) 90  MG 24 hr tablet Take 90 mg by mouth daily.   Yes Historical Provider, MD  potassium chloride (KLOR-CON) 8 MEQ tablet Take 8 mEq by mouth daily.   Yes Historical Provider, MD  tamsulosin (FLOMAX) 0.4 MG CAPS capsule Take 0.4 mg by mouth daily.   Yes Historical Provider, MD   Physical Exam: Filed Vitals:   12/01/13 0115  BP: 128/62  Pulse: 101  Temp:   Resp:     BP 128/62  Pulse 101  Temp(Src) 99.2 F (37.3 C) (Oral)  Resp 20  Ht 5\' 7"  (1.702 m)  Wt 108.863 kg (240 lb)  BMI 37.58 kg/m2  SpO2 96%  General Appearance:    Alert, oriented, somnulent,  and passes out mid conversation, passing out episodes seem to correspond with v.tach runs on monitor. no distress, appears stated age  Head:    Normocephalic, atraumatic  Eyes:    PERRL, EOMI, sclera non-icteric        Nose:   Nares without drainage or epistaxis. Mucosa, turbinates normal  Throat:   Moist mucous membranes. Oropharynx without erythema or exudate.  Neck:   Supple. No carotid bruits.  No thyromegaly.  No lymphadenopathy.   Back:     No CVA tenderness, no spinal tenderness  Lungs:     Diffuse wheezes bilaterally  Chest wall:    No tenderness to palpitation  Heart:    Irregularly irregular, without murmurs, gallops, rubs  Abdomen:     Soft, non-tender, nondistended, normal bowel sounds, no organomegaly  Genitalia:    deferred  Rectal:    deferred  Extremities:   No clubbing, cyanosis or edema.  Pulses:   2+ and symmetric all extremities  Skin:   Skin color, texture, turgor normal, no rashes or lesions  Lymph nodes:   Cervical, supraclavicular, and axillary nodes normal  Neurologic:   CNII-XII intact. Normal strength, sensation and reflexes      throughout    Labs on Admission:  Basic Metabolic Panel:  Recent Labs Lab 11/30/13 2107 12/01/13 0051  NA 140  --   K 2.8*  --   CL 100  --   CO2 28  --   GLUCOSE 149*  --   BUN 14  --   CREATININE 1.04  --   CALCIUM 9.0  --   MG  --  2.1   Liver Function Tests:  Recent Labs Lab 11/30/13 2107  AST 42*  ALT 18  ALKPHOS 83  BILITOT 0.6  PROT 6.8  ALBUMIN 3.6   No results found for this basename: LIPASE, AMYLASE,  in the last 168 hours  Recent Labs Lab 11/30/13 2206  AMMONIA 33   CBC:  Recent Labs Lab 11/30/13 2107  WBC 13.3*  NEUTROABS 12.1*  HGB 13.1  HCT 40.1  MCV 93.0  PLT 123*   Cardiac Enzymes:  Recent Labs Lab 11/30/13 2206  CKTOTAL 1384*    BNP (last 3 results) No results found for this basename: PROBNP,  in the last 8760 hours CBG: No results found for this basename: GLUCAP,  in  the last 168 hours  Radiological Exams on Admission: Dg Chest 2 View  12/01/2013   CLINICAL DATA:  Altered mental status, arrhythmia.  EXAM: CHEST  2 VIEW  COMPARISON:  Chest radiograph November 30, 2013 at 2141 hr  FINDINGS: Cardiac silhouette remains mild to moderately enlarged mildly calcified aortic knob. Mild central pulmonary vascular congestion in this decreased inspiratory examination. Strandy densities left lung base. No pleural  effusions or focal consolidations. No pneumothorax.  Osteopenia.  ACDF.  Mild degenerative change of thoracic spine.  IMPRESSION: Stable cardiomegaly, mild central pulmonary vascular congestion with left lung base atelectasis versus scarring.   Electronically Signed   By: Awilda Metroourtnay  Bloomer   On: 12/01/2013 01:40   Ct Head Wo Contrast  12/01/2013   CLINICAL DATA:  Confusion.  Altered mental status.  EXAM: CT HEAD WITHOUT CONTRAST  TECHNIQUE: Contiguous axial images were obtained from the base of the skull through the vertex without intravenous contrast.  COMPARISON:  None.  FINDINGS: Stable age related cerebral atrophy, ventriculomegaly and periventricular white matter disease. No extra-axial fluid collections are identified. No CT findings for acute hemispheric infarction or intracranial hemorrhage. No mass lesions. The brainstem and cerebellum are normal.  No acute bony findings. Scattered ethmoid sinus disease. The globes are intact.  IMPRESSION: Age related cerebral atrophy, ventriculomegaly and periventricular white matter disease.  No acute intracranial findings or mass lesions.   Electronically Signed   By: Loralie ChampagneMark  Gallerani M.D.   On: 12/01/2013 00:30   Dg Chest Portable 1 View  11/30/2013   CLINICAL DATA:  Confusion and weakness.  EXAM: PORTABLE CHEST - 1 VIEW  COMPARISON:  None.  FINDINGS: Heart size is upper limits of normal. Lungs are clear but difficult to evaluate the left lung base. Atherosclerotic calcifications at the aortic arch. Surgical plate in the cervical  spine.  IMPRESSION: No acute chest abnormality but limited evaluation of the left lung base.   Electronically Signed   By: Richarda OverlieAdam  Henn M.D.   On: 11/30/2013 22:20    EKG: Independently reviewed.  NSVT runs, see below.  Assessment/Plan Active Problems:   NSVT (nonsustained ventricular tachycardia)   SIRS (systemic inflammatory response syndrome)   Altered mental status   Hypokalemia   DM2 (diabetes mellitus, type 2)   HTN (hypertension)   Hypoxia   1. NSVT - cardiology has been consulted and Dr. Mayford Knifeurner is coming to see patient, also performing recs as below. 1. Hypokalemia - replacing potassium IV, I-stat potassium ordered and pending for repeat level, do not trust that patient can take PO at this time as he is passing out mid conversation with me (passes out when he has a run of V.Tach. 2. First troponin negative, repeat I-stat troponin as well as stat CK CKMB ordered 2. AMS - suspect that AMS may be related to recurrent NSVT episodes 3. SIRS - unclear source of sepsis, patient denies new neck stiffness (has chronic unchanged stiffness from ACDF), CXR shows no obvious PNA, UA negative, mild leukocytosis of 13k and his 103 fever was only on arrival so there could be a component of heat exposure as opposed to actual infection causing this.  Will go ahead and order broad spectrum antibiotics empirically, zosyn and vanc given the SIRS.  BCx already in progress. 4. DM2 - continue lantus, holding metformin, holding novolog, putting patient on q4h SSI while NPO. 5. Hypoxia - appears to be due to combination of Asthma / COPD as well as sleep apnea.  Will use xopenex instead of albuterol at cardiology request given that there is suspicion that albuterol in combination with hypokalemia caused V.Tach. 6. HTN - continue home meds but holding HCTZ for now    Code Status: Full Code  Family Communication: No family in room Disposition Plan: Admit to SDU  Time spent: 70 mins with patient and additional  60 mins critical care time spent with this very ill patient, critical care time  is required to prevent deterioration.  Ferd Horrigan M. [facility] Pager [phone]  If 7AM-7PM, please contact the day team taking care of the patient [email or web address] Password [email or web address] 12/01/2013, 1:58 AM

## 2013-12-01 NOTE — Consult Note (Signed)
Admit date: 11/30/2013 Referring Physician  Dr. Julian Reil Primary Physician  NONE Primary Cardiologist  NONE Reason for Consultation  Ventricular tachycardia  HPI: This is a 77yo WM with a history of HTN, type 2 DM, dyslipidemia and COPD with an asthma component who takes albuterol for wheezing.  He was brought into the ED with confusion. He is from [place] but was found outside of his car in [place], confused and had urinated on himself. Bystanders summoned EMS. Patient was confused and weak initially, and does not know why he is in [employer]. It is unknown how long he was outside in the hot weather outside of his car today. He was brought to the ED. In the ED his mental status has slightly improved, but he continued to have some SOB. Fever has improved without specific treatment, UA was negative, CXR shows pulmonary vascular congestion.  He was noted to be wheezing so albuterol was given.   He was also started on potassium replacement for his hypokalemia at 2.8. While in the ER the patient started developing short bursts of NSVT up to 14 beats. This seemed to occur after he had an Albuterol treatment.  He denies any chest pain or pressure and no history of cardiac disease in the past.  He has chronic DOE and SOB with his COPD.  He is a very poor historian and kept falling asleep during the interview.  He denies any LE edema, palpitations or syncope.    PMH:   Past Medical History  Diagnosis Date  . HTN (hypertension)   . DM2 (diabetes mellitus, type 2)   . HLD (hyperlipidemia)   . COPD (chronic obstructive pulmonary disease)      PSH:  No past surgical history on file.  Allergies:  Review of patient's allergies indicates not on file. Prior to Admit Meds:   (Not in a hospital admission) Fam HX:   No family history on file. Social HX:    History   Social History  . Marital Status: Divorced    Spouse Name: N/A    Number of Children: N/A  . Years of Education: N/A   Occupational  History  . Not on file.   Social History Main Topics  . Smoking status: Not on file  . Smokeless tobacco: Not on file  . Alcohol Use: Not on file  . Drug Use: Not on file  . Sexual Activity: Not on file   Other Topics Concern  . Not on file   Social History Narrative  . No narrative on file     ROS:  All 11 ROS were addressed and are negative except what is stated in the HPI  Physical Exam: Blood pressure 131/66, pulse 94, temperature 99.2 F (37.3 C), temperature source Oral, resp. rate 20, height 5\' 7"  (1.702 m), weight 240 lb (108.863 kg), SpO2 97.00%.    General: Well developed, well nourished, in no acute distress Head: Eyes PERRLA, No xanthomas.   Normal cephalic and atramatic  Lungs:   Clear bilaterally to auscultation and percussion. Heart:   HRRR S1 S2 Pulses are 2+ & equal.            No carotid bruit. No JVD.  No abdominal bruits. No femoral bruits. Abdomen: Bowel sounds are positive, abdomen soft and non-tender without masses  Extremities:   No clubbing, cyanosis or edema.  DP +1 Neuro: Alert and oriented X 3. Psych:  Good affect, responds appropriately    Labs:   Lab Results  Component  Value Date   WBC 13.3* 11/30/2013   HGB 12.9* 12/01/2013   HCT 38.0* 12/01/2013   MCV 93.0 11/30/2013   PLT 123* 11/30/2013    Recent Labs Lab 11/30/13 2107 12/01/13 0226  NA 140 137  K 2.8* 4.1  CL 100 101  CO2 28  --   BUN 14 16  CREATININE 1.04 1.00  CALCIUM 9.0  --   PROT 6.8  --   BILITOT 0.6  --   ALKPHOS 83  --   ALT 18  --   AST 42*  --   GLUCOSE 149* 140*   No results found for this basename: PTT   No results found for this basename: INR, PROTIME   Lab Results  Component Value Date   CKTOTAL 1384* 11/30/2013   TROPONINI <0.30 12/01/2013     No results found for this basename: CHOL   No results found for this basename: HDL   No results found for this basename: LDLCALC   No results found for this basename: TRIG   No results found for this  basename: CHOLHDL   No results found for this basename: LDLDIRECT      Radiology:  Dg Chest 2 View  12/01/2013   CLINICAL DATA:  Altered mental status, arrhythmia.  EXAM: CHEST  2 VIEW  COMPARISON:  Chest radiograph November 30, 2013 at 2141 hr  FINDINGS: Cardiac silhouette remains mild to moderately enlarged mildly calcified aortic knob. Mild central pulmonary vascular congestion in this decreased inspiratory examination. Strandy densities left lung base. No pleural effusions or focal consolidations. No pneumothorax.  Osteopenia.  ACDF.  Mild degenerative change of thoracic spine.  IMPRESSION: Stable cardiomegaly, mild central pulmonary vascular congestion with left lung base atelectasis versus scarring.   Electronically Signed   By: Awilda Metro   On: 12/01/2013 01:40   Ct Head Wo Contrast  12/01/2013   CLINICAL DATA:  Confusion.  Altered mental status.  EXAM: CT HEAD WITHOUT CONTRAST  TECHNIQUE: Contiguous axial images were obtained from the base of the skull through the vertex without intravenous contrast.  COMPARISON:  None.  FINDINGS: Stable age related cerebral atrophy, ventriculomegaly and periventricular white matter disease. No extra-axial fluid collections are identified. No CT findings for acute hemispheric infarction or intracranial hemorrhage. No mass lesions. The brainstem and cerebellum are normal.  No acute bony findings. Scattered ethmoid sinus disease. The globes are intact.  IMPRESSION: Age related cerebral atrophy, ventriculomegaly and periventricular white matter disease.  No acute intracranial findings or mass lesions.   Electronically Signed   By: Loralie Champagne M.D.   On: 12/01/2013 00:30   Dg Chest Portable 1 View  11/30/2013   CLINICAL DATA:  Confusion and weakness.  EXAM: PORTABLE CHEST - 1 VIEW  COMPARISON:  None.  FINDINGS: Heart size is upper limits of normal. Lungs are clear but difficult to evaluate the left lung base. Atherosclerotic calcifications at the aortic  arch. Surgical plate in the cervical spine.  IMPRESSION: No acute chest abnormality but limited evaluation of the left lung base.   Electronically Signed   By: Richarda Overlie M.D.   On: 11/30/2013 22:20    EKG:  Sinus tachycardia at 100bpm with nonspecific T wave abnormality and poor R wave progression in V1 and V2  ASSESSMENT:  1.  Nonsustained ventricular tachycardia most likely secondary to albuterol treatments in the setting of severe hypokalemia. Bedside portable echo showed poor acoustical windows but LVF appears to be normal in the apical 4 chamber  view.  He denies any chest pain. Troponin is normal x 2.  CPK is 1300 ? Rhabdo.  Magnesium is normal. 2.  Elevated CPK with normal troponin raises question of Rhabdo.  We do not know how long he was in his car today. Renal function is normal. ? Whether this could be heat stroke - MB is normal 3.  Fever of unknown origin with elevated WBC 4.  COPD with asthma 5.  HTN 6.  DM 7.  Hypokalemia - repleted  PLAN:   1.  Continue to cycle cardiac enzymes 2.  Fever workup per Hospitalist 3.  Formal 2D echo in am 4.  Avoid any further Albuterol 5.  If he continues to have VT after albuterol worn off and potassium repleted, may need to consider IV Amio. Would avoid BB with active wheezing and COPD with asthma 6.  Check TSH  Quintella ReichertURNER,TRACI R, MD  12/01/2013  3:01 AM

## 2013-12-01 NOTE — Progress Notes (Signed)
MD initiated adult wheeze protocol for pt. RT instructed pt on using a peak flow, and was told not to give TX due to hypokalemia and tachycardia followed by falling asleep unexpectedly while having sporadic runs of PVC'S.

## 2013-12-01 NOTE — ED Notes (Signed)
Admitting doctor at bedside 

## 2013-12-01 NOTE — ED Notes (Addendum)
Pt continuing to have long runs of VTach, coninting to pass out mid sentence when runs occur. Cardiology has been called

## 2013-12-01 NOTE — Progress Notes (Signed)
Placed pt on BiPAP at this time with settings of 10/5 and O2 of 40% pt is tolerating well at this time.

## 2013-12-01 NOTE — Progress Notes (Addendum)
PROGRESS NOTE    Samuel Thomas ZOX:[id number] DOB: 03/20/1936 DOA: 11/30/2013 PCP: No primary provider on file.  HPI/Brief narrative 78 year old male with history of HTN, DM 2, HLD, COPD, not on home oxygen, OSA on nightly CPAP, morbid obesity, was brought to the emergency department on 12/01/13 secondary to confusion. [place], [place] and had urinated on himself. Bystanders summoned EMS. Mental status in the ED had improved slightly. He was noted to have fever but every morning chest x-ray were negative. He was noted to have several runs of NSVT in the context of hypokalemia and nebulizations. Cardiology was consulted. Patient states that he fell off his bed on 8/19 and hurt his back and laid on the floor for a few hours. Next morning he was able to crawl to the bathroom and take a shower with improvement in his back pain. Yesterday he went out shopping for close in preparation for his daughter's wedding and a month and states that he might have been out in the hot sun. He complains of ongoing dysuria for months but denies fever, chills, sore throat, headache, neck pain, earache, cough, dyspnea, nausea, vomiting or diarrhea.   Assessment/Plan:  1. Acute encephalopathy: Unclear etiology. Patient denies using any pain medications.? Related to febrile illness, dehydration and heat exposure, hypoxia. No focal deficits. CT head negative. Resolved. Monitor closely. 2. NSVT: Cardiology consulted. Likely secondary to bronchodilator nebulizations in the context of hypokalemia. Monitor on telemetry. Avoid bronchodilators as much as possible. Potassium over- replaced. Magnesium normal.Check 2-D echo. TSH: Normal 3. Febrile illness/SIRS: Unclear etiology. Except dysuria no symptoms to suggest source. Initial UA and chest x-ray unremarkable. We'll repeat urine microscopy and culture.? Secondary to heat exposure. Started on empiric broad-spectrum IV vancomycin  and Zosyn. Continue for another 24 hours and DC if cultures negative. 4. Mild rhabdomyolysis/s/p mechanical fall: Mild back pain. Brief IV fluids and follow CK. Hold statins. PT evaluation 5. OSA on nightly CPAP/? OHS: Not on home oxygen. Stable. Continue CPAP. 6.  Hypoxia: Seen on initial ABG. May be related to mental status change in the context of OSA. Resolved. Saturating in the mid 90s on room air. Monitor 7. Hypertension: Controlled continue nifedipine. 8. Uncontrolled type II DM: Continue Lantus and SSI. Hold metformin. 9. Hypokalemia/hyperkalemia: Initial potassium was 2.8. Over replaced. Follow BMP this afternoon.  10. BPH: His urinary symptoms may be related to this. Repeat urine microscopy. Continue Flomax.  11. Mild anemia and thrombocytopenia:? Chronic thrombocytopenia. Follow CBC in a.m.   Code Status: Full Family Communication: None at bedside. Disposition Plan: Home when medically stable   Consultants:  Cardiology   Procedures:  None  Antibiotics:  IV Zosyn 8/21 >  IV vancomycin 8/21 >   Subjective:  chronic intermittent dysuria. Low back pain. Denies chest pain, dyspnea or palpitations.   Objective: Filed Vitals:   12/01/13 0715 12/01/13 0800 12/01/13 0852 12/01/13 0900  BP: 154/66 142/71  141/59  Pulse: 91 95 91 94  Temp:      TempSrc:      Resp: 17 20 21 25   Height:      Weight:      SpO2: 100% 95% 94% 94%    Intake/Output Summary (Last 24 hours) at 12/01/13 1119 Last data filed at 12/01/13 0821  Gross per 24 hour  Intake     10 ml  Output    950 ml  Net   -940 ml   Filed  Weights   11/30/13 2219  Weight: 108.863 kg (240 lb)     Exam:  General exam:  moderately built and morbidly obese elderly male lying comfortably supine in bed. Respiratory system: distant breath sounds and occasional rhonchi bilateral . No increased work of breathing. Cardiovascular system: S1 & S2 heard, RRR. No JVD, murmurs, gallops, clicks. Trace bilateral ankle  edema . Gastrointestinal system: Abdomen is nondistended, soft and nontender. Normal bowel sounds heard. Central nervous system: Alert and oriented x4 . No focal neurological deficits. Extremities: Symmetric 5 x 5 power.  Musculoskeletal: No spinal tenderness or deformity    Data Reviewed: Basic Metabolic Panel:  Recent Labs Lab 11/30/13 2107 12/01/13 0051 12/01/13 0226 12/01/13 0415  NA 140  --  137 135*  K 2.8*  --  4.1 5.5*  CL 100  --  101 100  CO2 28  --   --  26  GLUCOSE 149*  --  140* 136*  BUN 14  --  16 12  CREATININE 1.04  --  1.00 0.96  CALCIUM 9.0  --   --  8.3*  MG  --  2.1  --   --    Liver Function Tests:  Recent Labs Lab 11/30/13 2107  AST 42*  ALT 18  ALKPHOS 83  BILITOT 0.6  PROT 6.8  ALBUMIN 3.6   No results found for this basename: LIPASE, AMYLASE,  in the last 168 hours  Recent Labs Lab 11/30/13 2206  AMMONIA 33   CBC:  Recent Labs Lab 11/30/13 2107 12/01/13 0226 12/01/13 0415  WBC 13.3*  --  12.0*  NEUTROABS 12.1*  --   --   HGB 13.1 12.9* 12.2*  HCT 40.1 38.0* 36.6*  MCV 93.0  --  91.0  PLT 123*  --  124*   Cardiac Enzymes:  Recent Labs Lab 11/30/13 2206 12/01/13 0132 12/01/13 0222 12/01/13 0724  CKTOTAL 1384*  --  1163*  --   CKMB  --   --  3.2  --   TROPONINI  --  <0.30  --  <0.30   BNP (last 3 results) No results found for this basename: PROBNP,  in the last 8760 hours CBG:  Recent Labs Lab 12/01/13 0818  GLUCAP 139*    No results found for this or any previous visit (from the past 240 hour(s)).       Studies: Dg Chest 2 View  12/01/2013   CLINICAL DATA:  Altered mental status, arrhythmia.  EXAM: CHEST  2 VIEW  COMPARISON:  Chest radiograph November 30, 2013 at 2141 hr  FINDINGS: Cardiac silhouette remains mild to moderately enlarged mildly calcified aortic knob. Mild central pulmonary vascular congestion in this decreased inspiratory examination. Strandy densities left lung base. No pleural effusions or  focal consolidations. No pneumothorax.  Osteopenia.  ACDF.  Mild degenerative change of thoracic spine.  IMPRESSION: Stable cardiomegaly, mild central pulmonary vascular congestion with left lung base atelectasis versus scarring.   Electronically Signed   By: Awilda Metro   On: 12/01/2013 01:40   Ct Head Wo Contrast  12/01/2013   CLINICAL DATA:  Confusion.  Altered mental status.  EXAM: CT HEAD WITHOUT CONTRAST  TECHNIQUE: Contiguous axial images were obtained from the base of the skull through the vertex without intravenous contrast.  COMPARISON:  None.  FINDINGS: Stable age related cerebral atrophy, ventriculomegaly and periventricular white matter disease. No extra-axial fluid collections are identified. No CT findings for acute hemispheric infarction or intracranial hemorrhage. No mass lesions.  The brainstem and cerebellum are normal.  No acute bony findings. Scattered ethmoid sinus disease. The globes are intact.  IMPRESSION: Age related cerebral atrophy, ventriculomegaly and periventricular white matter disease.  No acute intracranial findings or mass lesions.   Electronically Signed   By: Loralie Champagne M.D.   On: 12/01/2013 00:30   Dg Chest Portable 1 View  11/30/2013   CLINICAL DATA:  Confusion and weakness.  EXAM: PORTABLE CHEST - 1 VIEW  COMPARISON:  None.  FINDINGS: Heart size is upper limits of normal. Lungs are clear but difficult to evaluate the left lung base. Atherosclerotic calcifications at the aortic arch. Surgical plate in the cervical spine.  IMPRESSION: No acute chest abnormality but limited evaluation of the left lung base.   Electronically Signed   By: Richarda Overlie M.D.   On: 11/30/2013 22:20        Scheduled Meds: . albuterol  5 mg Nebulization Once  . atorvastatin  20 mg Oral QHS  . baclofen  10 mg Oral Daily  . FLUoxetine  20 mg Oral Daily  . fluticasone  1-2 spray Each Nare Daily  . fluticasone  1 puff Inhalation BID  . gabapentin  600 mg Oral TID  . heparin   5,000 Units Subcutaneous 3 times per day  . insulin aspart  0-15 Units Subcutaneous TID WC  . [START ON 12/02/2013] insulin glargine  25 Units Subcutaneous BID  . ipratropium  0.5 mg Nebulization Once  . loratadine  10 mg Oral Daily  . losartan  100 mg Oral Daily  . NIFEdipine  90 mg Oral Daily  . piperacillin-tazobactam (ZOSYN)  IV  3.375 g Intravenous 3 times per day  . sodium chloride  3 mL Intravenous Q12H  . tamsulosin  0.4 mg Oral Daily  . vancomycin  750 mg Intravenous Q12H   Continuous Infusions: . sodium chloride 1,000 mL (11/30/13 2225)    Active Problems:   NSVT (nonsustained ventricular tachycardia)   SIRS (systemic inflammatory response syndrome)   Altered mental status   Hypokalemia   DM2 (diabetes mellitus, type 2)   HTN (hypertension)   Hypoxia    Time spent: 40 minutes.    Marcellus Scott, MD, FACP, FHM. [facility] Pager [phone]  If 7PM-7AM, please contact night-coverage [email or web address] Password [email or web address] 12/01/2013, 11:19 AM    LOS: 1 day

## 2013-12-02 ENCOUNTER — Inpatient Hospital Stay (HOSPITAL_COMMUNITY): Payer: Medicare Other

## 2013-12-02 DIAGNOSIS — I5031 Acute diastolic (congestive) heart failure: Secondary | ICD-10-CM | POA: Diagnosis not present

## 2013-12-02 DIAGNOSIS — I4729 Other ventricular tachycardia: Principal | ICD-10-CM

## 2013-12-02 DIAGNOSIS — I472 Ventricular tachycardia: Principal | ICD-10-CM

## 2013-12-02 DIAGNOSIS — E876 Hypokalemia: Secondary | ICD-10-CM

## 2013-12-02 DIAGNOSIS — M6282 Rhabdomyolysis: Secondary | ICD-10-CM

## 2013-12-02 LAB — BASIC METABOLIC PANEL
Anion gap: 14 (ref 5–15)
BUN: 14 mg/dL (ref 6–23)
CALCIUM: 8.5 mg/dL (ref 8.4–10.5)
CO2: 24 meq/L (ref 19–32)
CREATININE: 0.85 mg/dL (ref 0.50–1.35)
Chloride: 103 mEq/L (ref 96–112)
GFR calc Af Amer: >90 mL/min (ref >90)
GFR, EST NON AFRICAN AMERICAN: 83 mL/min — AB (ref >90)
GLUCOSE: 175 mg/dL — AB (ref 70–99)
Potassium: 3.8 mEq/L (ref 3.7–5.3)
SODIUM: 141 meq/L (ref 137–147)

## 2013-12-02 LAB — CBC
HEMATOCRIT: 35.7 % — AB (ref 39.0–52.0)
Hemoglobin: 11.9 g/dL — ABNORMAL LOW (ref 13.0–17.0)
MCH: 30.3 pg (ref 26.0–34.0)
MCHC: 33.3 g/dL (ref 30.0–36.0)
MCV: 90.8 fL (ref 78.0–100.0)
PLATELETS: ADEQUATE 10*3/uL (ref 150–400)
RBC: 3.93 MIL/uL — ABNORMAL LOW (ref 4.22–5.81)
RDW: 13.8 % (ref 11.5–15.5)
WBC: 7.9 10*3/uL (ref 4.0–10.5)

## 2013-12-02 LAB — GLUCOSE, CAPILLARY
GLUCOSE-CAPILLARY: 277 mg/dL — AB (ref 70–99)
GLUCOSE-CAPILLARY: 268 mg/dL — AB (ref 70–99)
Glucose-Capillary: 182 mg/dL — ABNORMAL HIGH (ref 70–99)
Glucose-Capillary: 259 mg/dL — ABNORMAL HIGH (ref 70–99)

## 2013-12-02 LAB — CK: CK TOTAL: 327 U/L — AB (ref 7–232)

## 2013-12-02 LAB — MAGNESIUM: Magnesium: 2.4 mg/dL (ref 1.5–2.5)

## 2013-12-02 MED ORDER — INSULIN ASPART 100 UNIT/ML ~~LOC~~ SOLN
3.0000 [IU] | Freq: Three times a day (TID) | SUBCUTANEOUS | Status: DC
  Administered 2013-12-02 – 2013-12-04 (×6): 3 [IU] via SUBCUTANEOUS

## 2013-12-02 MED ORDER — POTASSIUM CHLORIDE ER 10 MEQ PO TBCR
20.0000 meq | EXTENDED_RELEASE_TABLET | Freq: Two times a day (BID) | ORAL | Status: DC
  Administered 2013-12-02 (×2): 20 meq via ORAL
  Filled 2013-12-02 (×5): qty 2

## 2013-12-02 MED ORDER — INSULIN ASPART 100 UNIT/ML ~~LOC~~ SOLN
0.0000 [IU] | Freq: Three times a day (TID) | SUBCUTANEOUS | Status: DC
  Administered 2013-12-02 (×2): 8 [IU] via SUBCUTANEOUS
  Administered 2013-12-03: 3 [IU] via SUBCUTANEOUS
  Administered 2013-12-03: 11 [IU] via SUBCUTANEOUS
  Administered 2013-12-03 – 2013-12-04 (×2): 5 [IU] via SUBCUTANEOUS
  Administered 2013-12-04 – 2013-12-05 (×4): 3 [IU] via SUBCUTANEOUS
  Administered 2013-12-06: 2 [IU] via SUBCUTANEOUS
  Administered 2013-12-06 (×2): 5 [IU] via SUBCUTANEOUS
  Administered 2013-12-07 (×2): 8 [IU] via SUBCUTANEOUS

## 2013-12-02 MED ORDER — INSULIN ASPART 100 UNIT/ML ~~LOC~~ SOLN
0.0000 [IU] | Freq: Every day | SUBCUTANEOUS | Status: DC
  Administered 2013-12-02 – 2013-12-04 (×2): 3 [IU] via SUBCUTANEOUS

## 2013-12-02 MED ORDER — POTASSIUM CHLORIDE 10 MEQ/100ML IV SOLN
10.0000 meq | INTRAVENOUS | Status: AC
  Administered 2013-12-02 (×3): 10 meq via INTRAVENOUS
  Filled 2013-12-02 (×3): qty 100

## 2013-12-02 MED ORDER — FUROSEMIDE 10 MG/ML IJ SOLN
INTRAMUSCULAR | Status: AC
  Filled 2013-12-02: qty 4

## 2013-12-02 MED ORDER — FUROSEMIDE 10 MG/ML IJ SOLN
40.0000 mg | Freq: Two times a day (BID) | INTRAMUSCULAR | Status: AC
  Administered 2013-12-02 (×2): 40 mg via INTRAVENOUS
  Filled 2013-12-02: qty 4

## 2013-12-02 MED ORDER — SPIRONOLACTONE 25 MG PO TABS
25.0000 mg | ORAL_TABLET | Freq: Every day | ORAL | Status: DC
  Administered 2013-12-02 – 2013-12-07 (×6): 25 mg via ORAL
  Filled 2013-12-02 (×6): qty 1

## 2013-12-02 NOTE — Progress Notes (Addendum)
PROGRESS NOTE    Samuel Thomas ZOX:[id number] DOB: 11/24/1935 DOA: 11/30/2013 PCP: No primary provider on file.  HPI/Brief narrative 78 year old male with history of HTN, DM 2, HLD, COPD, not on home oxygen, OSA on nightly CPAP, morbid obesity, was brought to the emergency department on 12/01/13 secondary to confusion. [facility] and was found outside his car in [place], confused and had urinated on himself. Bystanders summoned EMS. Mental status in the ED had improved slightly. He was noted to have fever but every morning chest x-ray were negative. He was noted to have several runs of NSVT in the context of hypokalemia and nebulizations. Cardiology was consulted. Patient states that he fell off his bed on 8/19 and hurt his back and laid on the floor for a few hours. Next morning he was able to crawl to the bathroom and take a shower with improvement in his back pain. Yesterday he went out shopping for close in preparation for his daughter's wedding and a month and states that he might have been out in the hot sun. He complains of ongoing dysuria for months but denies fever, chills, sore throat, headache, neck pain, earache, cough, dyspnea, nausea, vomiting or diarrhea.   Assessment/Plan:  1. Acute encephalopathy: Unclear etiology. Patient denies using any pain medications.? Related to febrile illness, dehydration and heat exposure, hypoxia. No focal deficits. CT head negative. Resolved. Monitor closely. Patient denies significant alcohol use. Monitor CIWA. ? History of dementia per family/friends report. 2. NSVT: Cardiology following. Likely secondary to bronchodilator nebulizations in the context of hypokalemia. Monitor on telemetry. Avoid bronchodilators as much as possible. Keep potassium >4 and magnesium >2. 2-D echo-poor study but appears to have normal EF. TSH: Normal. Avoid beta blockers secondary to ?asthma/COPD. 3. Acute diastolic CHF: Cardiology starting IV Lasix and Aldactone.  Monitor clinically. 4. Febrile illness/SIRS: Unclear etiology. Except dysuria no symptoms to suggest source. Initial UA and chest x-ray unremarkable. ? Secondary to heat exposure. Started on empiric broad-spectrum IV vancomycin and Zosyn. Continue for another 24 hours and DC if cultures negative. Awaiting urine and blood culture reports. 5. Mild rhabdomyolysis/s/p mechanical fall: Mild back pain. Brief IV fluids and follow CK-improved. Held statins. PT evaluation. 6. OSA on nightly CPAP/? OHS: Not on home oxygen. Stable. Continue CPAP. 7.  Hypoxia: Seen on initial ABG. May be related to mental status change in the context of OSA. Resolved. Saturating in the mid 90s on room air. Monitor 8. Hypertension: Controlled continue nifedipine and ARB. 9. Uncontrolled type II DM: Continue Lantus and SSI. Hold metformin. Adjust insulins. Add mealtime NovoLog. Check A1c. 10. Hypokalemia/hyperkalemia: Initial potassium was 2.8. Improved to 3.8. 11. BPH: His urinary symptoms may be related to this. Repeat urine microscopy-not consistent with UTI. Continue Flomax.  12. Mild anemia and thrombocytopenia:? Chronic thrombocytopenia. Hemoglobin stable. Platelets clumped but counts appear okay.   Code Status: Full Family Communication: None at bedside. Disposition Plan: Transfer to telemetry. Home when medically stable.   Consultants:  Cardiology   Procedures:  None  Antibiotics:  IV Zosyn 8/21 >  IV vancomycin 8/21 >   Subjective: Complains of some dyspnea. No chest pain.  Objective: Filed Vitals:   12/02/13 0900 12/02/13 0906 12/02/13 1000 12/02/13 1125  BP: 117/73  137/56 136/60  Pulse: 85  90 86  Temp:    98 F (36.7 C)  TempSrc:    Oral  Resp: 18  22 20   Height:      Weight:  SpO2: 96% 95% 94% 94%    Intake/Output Summary (Last 24 hours) at 12/02/13 1137 Last data filed at 12/02/13 0900  Gross per 24 hour  Intake   1860 ml  Output    950 ml  Net    910 ml   Filed Weights    11/30/13 2219 12/02/13 0500  Weight: 108.863 kg (240 lb) 112.265 kg (247 lb 8 oz)     Exam:  General exam:  moderately built and morbidly obese elderly male lying comfortably supine in bed. Respiratory system: Few basal crackles. Rest of lung fields clear to auscultation. No increased work of breathing. Cardiovascular system: S1 & S2 heard, RRR. No JVD, murmurs, gallops, clicks. Trace bilateral ankle edema . Telemetry: Sinus rhythm with frequent PVCs and occasional 3 beat NSVT's which have improved since yesterday. Gastrointestinal system: Abdomen is nondistended, soft and nontender. Normal bowel sounds heard. Central nervous system: Alert and oriented x4 . No focal neurological deficits. Extremities: Symmetric 5 x 5 power.  Musculoskeletal: No spinal tenderness or deformity    Data Reviewed: Basic Metabolic Panel:  Recent Labs Lab 11/30/13 2107 12/01/13 0051 12/01/13 0226 12/01/13 0415 12/01/13 1836 12/02/13 0614  NA 140  --  137 135* 139 141  K 2.8*  --  4.1 5.5* 3.3* 3.8  CL 100  --  101 100 101 103  CO2 28  --   --  26 24 24   GLUCOSE 149*  --  140* 136* 219* 175*  BUN 14  --  16 12 15 14   CREATININE 1.04  --  1.00 0.96 0.88 0.85  CALCIUM 9.0  --   --  8.3* 8.6 8.5  MG  --  2.1  --   --   --  2.4   Liver Function Tests:  Recent Labs Lab 11/30/13 2107  AST 42*  ALT 18  ALKPHOS 83  BILITOT 0.6  PROT 6.8  ALBUMIN 3.6   No results found for this basename: LIPASE, AMYLASE,  in the last 168 hours  Recent Labs Lab 11/30/13 2206  AMMONIA 33   CBC:  Recent Labs Lab 11/30/13 2107 12/01/13 0226 12/01/13 0415 12/02/13 0614  WBC 13.3*  --  12.0* 7.9  NEUTROABS 12.1*  --   --   --   HGB 13.1 12.9* 12.2* 11.9*  HCT 40.1 38.0* 36.6* 35.7*  MCV 93.0  --  91.0 90.8  PLT 123*  --  124* PLATELET CLUMPS NOTED ON SMEAR, COUNT APPEARS ADEQUATE   Cardiac Enzymes:  Recent Labs Lab 11/30/13 2206 12/01/13 0132 12/01/13 0222 12/01/13 0724 12/01/13 1314  12/01/13 1836 12/01/13 2250 12/02/13 0614  CKTOTAL 1384*  --  1163*  --   --  623*  --  327*  CKMB  --   --  3.2  --   --   --   --   --   TROPONINI  --  <0.30  --  <0.30 <0.30  --  <0.30  --    BNP (last 3 results) No results found for this basename: PROBNP,  in the last 8760 hours CBG:  Recent Labs Lab 12/01/13 0818 12/01/13 1132 12/01/13 1708 12/01/13 2136 12/02/13 0753  GLUCAP 139* 144* 229* 198* 182*    Recent Results (from the past 240 hour(s))  MRSA PCR SCREENING     Status: None   Collection Time    12/01/13 10:39 AM      Result Value Ref Range Status   MRSA by PCR NEGATIVE  NEGATIVE  Final   Comment:            The GeneXpert MRSA Assay (FDA     approved for NASAL specimens     only), is one component of a     comprehensive MRSA colonization     surveillance program. It is not     intended to diagnose MRSA     infection nor to guide or     monitor treatment for     MRSA infections.         Studies: Dg Chest 2 View  12/01/2013   CLINICAL DATA:  Altered mental status, arrhythmia.  EXAM: CHEST  2 VIEW  COMPARISON:  Chest radiograph November 30, 2013 at 2141 hr  FINDINGS: Cardiac silhouette remains mild to moderately enlarged mildly calcified aortic knob. Mild central pulmonary vascular congestion in this decreased inspiratory examination. Strandy densities left lung base. No pleural effusions or focal consolidations. No pneumothorax.  Osteopenia.  ACDF.  Mild degenerative change of thoracic spine.  IMPRESSION: Stable cardiomegaly, mild central pulmonary vascular congestion with left lung base atelectasis versus scarring.   Electronically Signed   By: Awilda Metro   On: 12/01/2013 01:40   Ct Head Wo Contrast  12/01/2013   CLINICAL DATA:  Confusion.  Altered mental status.  EXAM: CT HEAD WITHOUT CONTRAST  TECHNIQUE: Contiguous axial images were obtained from the base of the skull through the vertex without intravenous contrast.  COMPARISON:  None.  FINDINGS:  Stable age related cerebral atrophy, ventriculomegaly and periventricular white matter disease. No extra-axial fluid collections are identified. No CT findings for acute hemispheric infarction or intracranial hemorrhage. No mass lesions. The brainstem and cerebellum are normal.  No acute bony findings. Scattered ethmoid sinus disease. The globes are intact.  IMPRESSION: Age related cerebral atrophy, ventriculomegaly and periventricular white matter disease.  No acute intracranial findings or mass lesions.   Electronically Signed   By: Loralie Champagne M.D.   On: 12/01/2013 00:30   Dg Chest Port 1 View  12/02/2013   CLINICAL DATA:  Shortness of breath  EXAM: PORTABLE CHEST - 1 VIEW  COMPARISON:  12/01/2013  FINDINGS: Stable mild cardiomegaly with central vascular congestion. Minor basilar atelectasis/ scarring. No new consolidation, collapse or focal pneumonia. No effusion or pneumothorax. Trachea midline. Prior cervical fusion hardware noted.  IMPRESSION: Stable cardiomegaly with vascular congestion and basilar atelectasis.   Electronically Signed   By: Ruel Favors M.D.   On: 12/02/2013 09:05   Dg Chest Portable 1 View  11/30/2013   CLINICAL DATA:  Confusion and weakness.  EXAM: PORTABLE CHEST - 1 VIEW  COMPARISON:  None.  FINDINGS: Heart size is upper limits of normal. Lungs are clear but difficult to evaluate the left lung base. Atherosclerotic calcifications at the aortic arch. Surgical plate in the cervical spine.  IMPRESSION: No acute chest abnormality but limited evaluation of the left lung base.   Electronically Signed   By: Richarda Overlie M.D.   On: 11/30/2013 22:20        Scheduled Meds: . baclofen  10 mg Oral Daily  . FLUoxetine  20 mg Oral Daily  . fluticasone  1-2 spray Each Nare Daily  . fluticasone  1 puff Inhalation BID  . furosemide  40 mg Intravenous BID  . gabapentin  600 mg Oral TID  . heparin  5,000 Units Subcutaneous 3 times per day  . insulin aspart  0-15 Units Subcutaneous  TID WC  . insulin glargine  25 Units Subcutaneous  BID  . loratadine  10 mg Oral Daily  . losartan  100 mg Oral Daily  . NIFEdipine  90 mg Oral Daily  . piperacillin-tazobactam (ZOSYN)  IV  3.375 g Intravenous 3 times per day  . potassium chloride  20 mEq Oral BID  . sodium chloride  3 mL Intravenous Q12H  . spironolactone  25 mg Oral Daily  . tamsulosin  0.4 mg Oral Daily  . vancomycin  750 mg Intravenous Q12H   Continuous Infusions:    Active Problems:   NSVT (nonsustained ventricular tachycardia)   SIRS (systemic inflammatory response syndrome)   Altered mental status   Hypokalemia   DM2 (diabetes mellitus, type 2)   HTN (hypertension)   Hypoxia   Acute diastolic heart failure    Time spent: 40 minutes.    Marcellus Scott, MD, FACP, FHM. [facility] Pager [phone]  If 7PM-7AM, please contact night-coverage [email or web address] Password [facility] 12/02/2013, 11:37 AM    LOS: 2 days

## 2013-12-02 NOTE — Progress Notes (Addendum)
Subjective:    78yo WM with a history of HTN, type 2 DM, dyslipidemia and COPD with an asthma component who was admitted with AMS. Developed NSVT with albuterol in setting of K 2.8.   ECHO: LVEF normal.  K now 3.8. Trop negative.   Alert and oriented. Feels SOB. + non-productive cough. No CP. Weight up 7 pounds.   CXR + CHF  Tele: SR with frequent PVCs and brief runs NSVT (3-5 beats)   Intake/Output Summary (Last 24 hours) at 12/02/13 0852 Last data filed at 12/02/13 0700  Gross per 24 hour  Intake   1860 ml  Output    650 ml  Net   1210 ml    Current meds: . baclofen  10 mg Oral Daily  . FLUoxetine  20 mg Oral Daily  . fluticasone  1-2 spray Each Nare Daily  . fluticasone  1 puff Inhalation BID  . gabapentin  600 mg Oral TID  . heparin  5,000 Units Subcutaneous 3 times per day  . insulin aspart  0-15 Units Subcutaneous TID WC  . insulin glargine  25 Units Subcutaneous BID  . loratadine  10 mg Oral Daily  . losartan  100 mg Oral Daily  . NIFEdipine  90 mg Oral Daily  . piperacillin-tazobactam (ZOSYN)  IV  3.375 g Intravenous 3 times per day  . sodium chloride  3 mL Intravenous Q12H  . tamsulosin  0.4 mg Oral Daily  . vancomycin  750 mg Intravenous Q12H   Infusions:     Objective:  Blood pressure 156/61, pulse 85, temperature 98.4 F (36.9 C), temperature source Oral, resp. rate 22, height  (1.702 m), weight 112.265 kg (247 lb 8 oz), SpO2 99.00%. Weight change: 3.402 kg (7 lb 8 oz)  Physical Exam: General exam: morbidly obese elderly male lying comfortably in bed.  Respiratory system: distant breath sounds. No wheezing Cardiovascular system:, RRR with ectopy. JVP hard to see but looks up.Trace bilateral ankle edema .  Gastrointestinal system: Abdomen is obese nondistended, soft and nontender. Normal bowel sounds heard.  Central nervous system: Alert and oriented x4 . No focal neurological deficits.  Extremities: Symmetric 5 x 5 power.    Telemetry: per  HPI  Lab Results: Basic Metabolic Panel:  Recent Labs Lab 11/30/13 2107 12/01/13 0051 12/01/13 0226 12/01/13 0415 12/01/13 1836 12/02/13 0614  NA 140  --  137 135* 139 141  K 2.8*  --  4.1 5.5* 3.3* 3.8  CL 100  --  101 100 101 103  CO2 28  --   --  GLUCOSE 149*  --  140* 136* 219* 175*  BUN 14  --  CREATININE 1.04  --  1.00 0.96 0.88 0.85  CALCIUM 9.0  --   --  8.3* 8.6 8.5  MG  --  2.1  --   --   --  2.4   Liver Function Tests:  Recent Labs Lab 11/30/13 2107  AST 42*  ALT 18  ALKPHOS 83  BILITOT 0.6  PROT 6.8  ALBUMIN 3.6   No results found for this basename: LIPASE, AMYLASE,  in the last 168 hours  Recent Labs Lab 11/30/13 2206  AMMONIA 33   CBC:  Recent Labs Lab 11/30/13 2107 12/01/13 0226 12/01/13 0415 12/02/13 0614  WBC 13.3*  --  12.0* 7.9  NEUTROABS 12.1*  --   --   --   HGB 13.1 12.9* 12.2* 11.9*  HCT 40.1 38.0* 36.6* 35.7*  MCV 93.0  --  91.0 90.8  PLT 123*  --  124* PLATELET CLUMPS NOTED ON SMEAR, COUNT APPEARS ADEQUATE   Cardiac Enzymes:  Recent Labs Lab 11/30/13 2206 12/01/13 0132 12/01/13 0222 12/01/13 0724 12/01/13 1314 12/01/13 1836 12/01/13 2250 12/02/13 0614  CKTOTAL 1384*  --  1163*  --   --  623*  --  327*  CKMB  --   --  3.2  --   --   --   --   --   TROPONINI  --  <0.30  --  <0.30 <0.30  --  <0.30  --    BNP: No components found with this basename: POCBNP,  CBG:  Recent Labs Lab 12/01/13 0818 12/01/13 1132 12/01/13 1708 12/01/13 2136 12/02/13 0753  GLUCAP 139* 144* 229* 198* 182*   Microbiology: No results found for this basename: cult   No results found for this basename: CULT, SDES,  in the last 168 hours  Imaging: Dg Chest 2 View  12/01/2013   CLINICAL DATA:  Altered mental status, arrhythmia.  EXAM: CHEST  2 VIEW  COMPARISON:  Chest radiograph November 30, 2013 at 2141 hr  FINDINGS: Cardiac silhouette remains mild to moderately enlarged mildly calcified aortic knob. Mild central  pulmonary vascular congestion in this decreased inspiratory examination. Strandy densities left lung base. No pleural effusions or focal consolidations. No pneumothorax.  Osteopenia.  ACDF.  Mild degenerative change of thoracic spine.  IMPRESSION: Stable cardiomegaly, mild central pulmonary vascular congestion with left lung base atelectasis versus scarring.   Electronically Signed   By: Awilda Metro   On: 12/01/2013 01:40   Ct Head Wo Contrast  12/01/2013   CLINICAL DATA:  Confusion.  Altered mental status.  EXAM: CT HEAD WITHOUT CONTRAST  TECHNIQUE: Contiguous axial images were obtained from the base of the skull through the vertex without intravenous contrast.  COMPARISON:  None.  FINDINGS: Stable age related cerebral atrophy, ventriculomegaly and periventricular white matter disease. No extra-axial fluid collections are identified. No CT findings for acute hemispheric infarction or intracranial hemorrhage. No mass lesions. The brainstem and cerebellum are normal.  No acute bony findings. Scattered ethmoid sinus disease. The globes are intact.  IMPRESSION: Age related cerebral atrophy, ventriculomegaly and periventricular white matter disease.  No acute intracranial findings or mass lesions.   Electronically Signed   By: Loralie Champagne M.D.   On: 12/01/2013 00:30   Dg Chest Portable 1 View  11/30/2013   CLINICAL DATA:  Confusion and weakness.  EXAM: PORTABLE CHEST - 1 VIEW  COMPARISON:  None.  FINDINGS: Heart size is upper limits of normal. Lungs are clear but difficult to evaluate the left lung base. Atherosclerotic calcifications at the aortic arch. Surgical plate in the cervical spine.  IMPRESSION: No acute chest abnormality but limited evaluation of the left lung base.   Electronically Signed   By: Richarda Overlie M.D.   On: 11/30/2013 22:20     ASSESSMENT:  1. NSVT 2. Hypokalemia 3. COPD 4. Obesity 5. Acute on chronic diastolic HF 6. Probable OSA   PLAN/DISCUSSION:  Continues with mild  ventricular ectopy. Troponin and echo are normal. I suspect this is related to his respiratory status and OSA. No b-blocker due to asthma. Keep K >= 4.0 Mg >= 2.0  Currently appears volume overloaded. Suspect diastolic HF on top of COPD. Would stop IVF. Start lasix and spironolactone. Watch potassium. Check BNP.  Will need outpatient sleep study.  LOS: 2 days    Samuel Meres, MD 12/02/2013, 8:52 AM

## 2013-12-02 NOTE — Progress Notes (Signed)
Report called to receiving rn [id number]. Will transfer via WC.

## 2013-12-02 NOTE — Evaluation (Signed)
Physical Therapy Evaluation Patient Details Name: Samuel Thomas MRN: [id number] DOB: 12/29/1935 Today's Date: 12/02/2013   History of Present Illness  78 year old male with history of HTN, DM 2, HLD, COPD not on home oxygen, OSA on nightly CPAP, morbid obesity, was brought to the ED on 12/01/13 secondary to confusion. [place] and [place] found [place] in [place], confused and had urinated on himself. Bystanders summoned EMS. Mental status in the ED had improved slightly. He was noted to have fever but chest x-ray was negative. He was noted to have several runs of NSVT in the context of hypokalemia and nebulizations. Cardiology was consulted. Patient states that he fell off his bed on 8/19 and hurt his back and laid on the floor for a few hours. Next morning he was able to crawl to the bathroom and take a shower with improvement in his back pain. Yesterday he went out shopping for clothes in preparation for his daughter's wedding in a month and states that he might have been out in the hot sun.  Clinical Impression  Pt admitted with the above. Pt currently with functional limitations due to the deficits listed below (see PT Problem List). At the time of PT eval pt was able to perform transfers and ambulation with min guard assist or supervision for safety. Pt will benefit from skilled PT to increase their independence and safety with mobility to allow discharge to the venue listed below. Pt states he is comfortable returning home at this time as he has 2 friends that live with him and assist him as needed. Would prefer pt to have 24 hour supervision at least the first day or two if discharging home as he has a history of falls.     Follow Up Recommendations Home health PT; 24 hour Supervision    Equipment Recommendations  None recommended by PT    Recommendations for Other Services       Precautions / Restrictions Precautions Precautions: Fall Precaution Comments: Pt states he has  had many falls Restrictions Weight Bearing Restrictions: No      Mobility  Bed Mobility Overal bed mobility: Needs Assistance Bed Mobility: Supine to Sit     Supine to sit: Min guard     General bed mobility comments: VC's for sequencing and technique. Pt requires increased time to complete transfer to EOB. Guarding only, but no real physical assist.   Transfers Overall transfer level: Needs assistance Equipment used: Rolling walker (2 wheeled) Transfers: Sit to/from Stand Sit to Stand: Supervision         General transfer comment: VC's for hand placement on seated surface for safety. Pt was able to power-up to full standing position without physical assist. No unsteadiness noted.   Ambulation/Gait Ambulation/Gait assistance: Min guard Ambulation Distance (Feet): 100 Feet Assistive device: Rolling walker (2 wheeled) Gait Pattern/deviations: Step-through pattern;Decreased stride length;Trunk flexed;Trendelenburg Gait velocity: Decreased Gait velocity interpretation: Below normal speed for age/gender General Gait Details: Pt static standing for ~5 minutes prior to initiating ambulation with good balance. Pt with one short standing rest break after 50 feet. Pt does not report SOB at end of gait training, however states he is fatigued.   Stairs            Wheelchair Mobility    Modified Rankin (Stroke Patients Only)       Balance Overall balance assessment: Needs assistance Sitting-balance support: Feet supported;No upper extremity supported Sitting balance-Leahy Scale: Good     Standing  balance support: Single extremity supported;During functional activity Standing balance-Leahy Scale: Fair Standing balance comment: Pt able to hold urinal while standing to void without assist.                              Pertinent Vitals/Pain Pain Assessment: No/denies pain    Home Living Family/patient expects to be discharged to:: Private residence Living  Arrangements: Spouse/significant other Available Help at Discharge: Friend(s);Available 24 hours/day Type of Home: House Home Access: Stairs to enter   [employer] of Steps: 10 Home Layout: One level Home Equipment: Walker - 2 wheels Additional Comments: Pt states his girlfriend and another male friend live with him and help take care of him.    Prior Function Level of Independence: Independent with assistive device(s)         Comments: Pt states he likes to cook and does his own grocery shopping (girlfriend drives him). Pt states he does his own bathing/dressing every day.      Hand Dominance   Dominant Hand: Right    Extremity/Trunk Assessment   Upper Extremity Assessment: Defer to OT evaluation           Lower Extremity Assessment: Generalized weakness      Cervical / Trunk Assessment: Kyphotic  Communication   Communication: No difficulties  Cognition Arousal/Alertness: Lethargic (at beginning of session, awake once mobility started) Behavior During Therapy: WFL for tasks assessed/performed Overall Cognitive Status: Within Functional Limits for tasks assessed                      General Comments      Exercises        Assessment/Plan    PT Assessment Patient needs continued PT services  PT Diagnosis Difficulty walking;Generalized weakness   PT Problem List Decreased strength;Decreased range of motion;Decreased activity tolerance;Decreased balance;Decreased mobility;Decreased knowledge of use of DME;Decreased safety awareness;Decreased knowledge of precautions  PT Treatment Interventions DME instruction;Gait training;Stair training;Functional mobility training;Therapeutic activities;Therapeutic exercise;Neuromuscular re-education;Patient/family education   PT Goals (Current goals can be found in the Care Plan section) Acute Rehab PT Goals Patient Stated Goal: To return home with girlfriend and friend PT Goal Formulation: With  patient Time For Goal Achievement: 12/09/13 Potential to Achieve Goals: Good    Frequency Min 3X/week   Barriers to discharge        Co-evaluation               End of Session Equipment Utilized During Treatment: Gait belt Activity Tolerance: Patient tolerated treatment well Patient left: in chair;with call bell/phone within reach Nurse Communication: Mobility status         Time: 2130-8657 PT Time Calculation (min): 35 min   Charges:   PT Evaluation $Initial PT Evaluation Tier I: 1 Procedure PT Treatments $Gait Training: 8-22 mins $Therapeutic Activity: 8-22 mins   PT G Codes:          Ruthann Cancer 12/02/2013, 2:25 PM  Ruthann Cancer, PT, DPT Acute Rehabilitation Services Pager: [phone]

## 2013-12-03 LAB — BASIC METABOLIC PANEL
ANION GAP: 13 (ref 5–15)
BUN: 16 mg/dL (ref 6–23)
CO2: 27 mEq/L (ref 19–32)
CREATININE: 0.92 mg/dL (ref 0.50–1.35)
Calcium: 8.6 mg/dL (ref 8.4–10.5)
Chloride: 106 mEq/L (ref 96–112)
GFR calc non Af Amer: 80 mL/min — ABNORMAL LOW (ref >90)
Glucose, Bld: 163 mg/dL — ABNORMAL HIGH (ref 70–99)
POTASSIUM: 3.4 meq/L — AB (ref 3.7–5.3)
Sodium: 146 mEq/L (ref 137–147)

## 2013-12-03 LAB — GLUCOSE, CAPILLARY
GLUCOSE-CAPILLARY: 215 mg/dL — AB (ref 70–99)
Glucose-Capillary: 164 mg/dL — ABNORMAL HIGH (ref 70–99)
Glucose-Capillary: 325 mg/dL — ABNORMAL HIGH (ref 70–99)

## 2013-12-03 LAB — URINE CULTURE
COLONY COUNT: NO GROWTH
Culture: NO GROWTH

## 2013-12-03 LAB — HEMOGLOBIN A1C
Hgb A1c MFr Bld: 6.8 % — ABNORMAL HIGH (ref <5.7)
Mean Plasma Glucose: 148 mg/dL — ABNORMAL HIGH (ref <117)

## 2013-12-03 LAB — PRO B NATRIURETIC PEPTIDE: Pro B Natriuretic peptide (BNP): 2581 pg/mL — ABNORMAL HIGH (ref 0–450)

## 2013-12-03 MED ORDER — POTASSIUM CHLORIDE CRYS ER 20 MEQ PO TBCR
40.0000 meq | EXTENDED_RELEASE_TABLET | Freq: Every day | ORAL | Status: DC
  Administered 2013-12-03 – 2013-12-04 (×2): 40 meq via ORAL
  Filled 2013-12-03 (×3): qty 2

## 2013-12-03 MED ORDER — FUROSEMIDE 10 MG/ML IJ SOLN
40.0000 mg | Freq: Every day | INTRAMUSCULAR | Status: DC
  Administered 2013-12-03: 40 mg via INTRAVENOUS
  Filled 2013-12-03 (×2): qty 4

## 2013-12-03 MED ORDER — POTASSIUM CHLORIDE CRYS ER 20 MEQ PO TBCR
20.0000 meq | EXTENDED_RELEASE_TABLET | Freq: Once | ORAL | Status: AC
  Administered 2013-12-03: 20 meq via ORAL

## 2013-12-03 MED ORDER — GUAIFENESIN-DM 100-10 MG/5ML PO SYRP
5.0000 mL | ORAL_SOLUTION | ORAL | Status: DC | PRN
  Administered 2013-12-03 – 2013-12-04 (×2): 5 mL via ORAL
  Filled 2013-12-03 (×2): qty 5

## 2013-12-03 NOTE — Progress Notes (Signed)
PROGRESS NOTE    Samuel Thomas ZOX:[id number] DOB: 1935/07/08 DOA: 11/30/2013 PCP: No primary provider on file.  HPI/Brief narrative 78 year old male with history of HTN, DM 2, HLD, COPD, not on home oxygen, OSA on nightly CPAP, morbid obesity, was brought to the emergency department on 12/01/13 secondary to confusion. He is from [facility] and was found outside his car in [place], confused and had urinated on himself. Bystanders summoned EMS. Mental status in the ED had improved slightly. He was noted to have fever but every morning chest x-ray were negative. He was noted to have several runs of NSVT in the context of hypokalemia and nebulizations. Cardiology was consulted. Patient states that he fell off his bed on 8/19 and hurt his back and laid on the floor for a few hours. Next morning he was able to crawl to the bathroom and take a shower with improvement in his back pain. Yesterday he went out shopping for close in preparation for his daughter's wedding and a month and states that he might have been out in the hot sun. He complains of ongoing dysuria for months but denies fever, chills, sore throat, headache, neck pain, earache, cough, dyspnea, nausea, vomiting or diarrhea.   Assessment/Plan:  1. Acute encephalopathy: Unclear etiology. Patient denies using any pain medications.? Related to febrile illness, dehydration and heat exposure, hypoxia. No focal deficits. CT head negative. Resolved. Monitor closely. Patient denies significant alcohol use. Monitor CIWA. ? History of dementia per family/friends report. 2. NSVT: Cardiology signed off. Likely secondary to bronchodilator nebulizations in the context of hypokalemia. Monitor on telemetry. Avoid bronchodilators as much as possible. Keep potassium >4 and magnesium >2. 2-D echo-poor study but appears to have normal EF. TSH: Normal. Avoid beta blockers secondary to ?asthma/COPD. 3. Acute on chronic diastolic CHF: Cardiology starting IV Lasix and  Aldactone. Continue additional 24 hours of IV Lasix. Monitor clinically. 4. Febrile illness/SIRS: Unclear etiology. Except dysuria no symptoms to suggest source. Initial UA and chest x-ray unremarkable. ? Secondary to heat exposure. Started on empiric broad-spectrum IV vancomycin and Zosyn. Blood cultures x2-negative to date. Urine culture negative. DC antibiotics and monitor. 5. Mild rhabdomyolysis/s/p mechanical fall: Mild back pain. Brief IV fluids and follow CK-improved. Held statins. PT evaluation. 6. OSA on nightly CPAP/? OHS: Not on home oxygen. Stable. Continue CPAP. 7. Hypoxia: Seen on initial ABG. May be related to mental status change in the context of OSA. Resolved. Saturating in the mid 90s on room air. Monitor 8. Hypertension: Controlled continue nifedipine and ARB. 9. Uncontrolled type II DM: Continue Lantus and SSI. Hold metformin. Adjust insulins. Add mealtime NovoLog. A1c: 6.8. Better controlled. 10. Hypokalemia/hyperkalemia: Replace as needed and keep >4 11. BPH: His urinary symptoms may be related to this. Repeat urine microscopy-not consistent with UTI. Continue Flomax.  12. Mild anemia and thrombocytopenia:? Chronic thrombocytopenia. Hemoglobin stable. Platelets clumped but counts appear okay.   Code Status: Full Family Communication: Discussed with Ms. Margot Janajar/HCPOA. Disposition Plan: Home when medically stable.   Consultants:  Cardiology -signs of 8/23  Procedures:  None  Antibiotics:  IV Zosyn 8/21 > 8/23  IV vancomycin 8/21 > 8/22  Subjective: Complains of chronic DOE on mild exertion.  Objective: Filed Vitals:   12/03/13 0212 12/03/13 0500 12/03/13 0928 12/03/13 1055  BP:  138/95  115/71  Pulse:  98  95  Temp: 99.1 F (37.3 C) 98.7 F (37.1 C)  98.1 F (36.7 C)  TempSrc: Oral Oral  Oral  Resp:  18  18  Height:      Weight:  110.904 kg (244 lb 8 oz)    SpO2:  94% 96% 95%    Intake/Output Summary (Last 24 hours) at 12/03/13 1328 Last  data filed at 12/03/13 0840  Gross per 24 hour  Intake  992.5 ml  Output   1600 ml  Net -607.5 ml   Filed Weights   12/02/13 0500 12/02/13 1500 12/03/13 0500  Weight: 112.265 kg (247 lb 8 oz) 112.447 kg (247 lb 14.4 oz) 110.904 kg (244 lb 8 oz)     Exam:  General exam:  moderately built and morbidly obese elderly sitting up comfortably in chair. Respiratory system: Few basal crackles. Rest of lung fields clear to auscultation. No increased work of breathing. Cardiovascular system: S1 & S2 heard, RRR. No JVD, murmurs, gallops, clicks. Trace bilateral ankle edema . Telemetry: Sinus rhythm with frequent PVCs and occasional 3 beat NSVT's which have improved. Gastrointestinal system: Abdomen is nondistended, soft and nontender. Normal bowel sounds heard. Central nervous system: Alert and oriented x4 . No focal neurological deficits. Extremities: Symmetric 5 x 5 power.  Musculoskeletal: No spinal tenderness or deformity    Data Reviewed: Basic Metabolic Panel:  Recent Labs Lab 11/30/13 2107 12/01/13 0051 12/01/13 0226 12/01/13 0415 12/01/13 1836 12/02/13 0614 12/03/13 0357  NA 140  --  137 135* 139 141 146  K 2.8*  --  4.1 5.5* 3.3* 3.8 3.4*  CL 100  --  101 100 101 103 106  CO2 28  --   --  GLUCOSE 149*  --  140* 136* 219* 175* 163*  BUN 14  --  CREATININE 1.04  --  1.00 0.96 0.88 0.85 0.92  CALCIUM 9.0  --   --  8.3* 8.6 8.5 8.6  MG  --  2.1  --   --   --  2.4  --    Liver Function Tests:  Recent Labs Lab 11/30/13 2107  AST 42*  ALT 18  ALKPHOS 83  BILITOT 0.6  PROT 6.8  ALBUMIN 3.6   No results found for this basename: LIPASE, AMYLASE,  in the last 168 hours  Recent Labs Lab 11/30/13 2206  AMMONIA 33   CBC:  Recent Labs Lab 11/30/13 2107 12/01/13 0226 12/01/13 0415 12/02/13 0614  WBC 13.3*  --  12.0* 7.9  NEUTROABS 12.1*  --   --   --   HGB 13.1 12.9* 12.2* 11.9*  HCT 40.1 38.0* 36.6* 35.7*  MCV 93.0  --  91.0 90.8    PLT 123*  --  124* PLATELET CLUMPS NOTED ON SMEAR, COUNT APPEARS ADEQUATE   Cardiac Enzymes:  Recent Labs Lab 11/30/13 2206 12/01/13 0132 12/01/13 0222 12/01/13 0724 12/01/13 1314 12/01/13 1836 12/01/13 2250 12/02/13 0614  CKTOTAL 1384*  --  1163*  --   --  623*  --  327*  CKMB  --   --  3.2  --   --   --   --   --   TROPONINI  --  <0.30  --  <0.30 <0.30  --  <0.30  --    BNP (last 3 results)  Recent Labs  12/03/13 0357  PROBNP 2581.0*   CBG:  Recent Labs Lab 12/02/13 0753 12/02/13 1131 12/02/13 1648 12/02/13 2130 12/03/13 0621  GLUCAP 182* 277* 259* 268* 164*    Recent Results (from the past 240 hour(s))  CULTURE, BLOOD (ROUTINE X  2)     Status: None   Collection Time    11/30/13 10:06 PM      Result Value Ref Range Status   Specimen Description BLOOD RIGHT ARM   Final   Special Requests BOTTLES DRAWN AEROBIC AND ANAEROBIC 4CC   Final   Culture  Setup Time     Final   Value: 12/01/2013 05:04     Performed at [facility]   Culture     Final   Value:        BLOOD CULTURE RECEIVED NO GROWTH TO DATE CULTURE WILL BE HELD FOR 5 DAYS BEFORE ISSUING A FINAL NEGATIVE REPORT     Performed at [facility]   Report Status PENDING   Incomplete  CULTURE, BLOOD (ROUTINE X 2)     Status: None   Collection Time    11/30/13 10:15 PM      Result Value Ref Range Status   Specimen Description BLOOD RIGHT HAND   Final   Special Requests BOTTLES DRAWN AEROBIC AND ANAEROBIC 6CC   Final   Culture  Setup Time     Final   Value: 12/01/2013 05:04     Performed at [facility]   Culture     Final   Value:        BLOOD CULTURE RECEIVED NO GROWTH TO DATE CULTURE WILL BE HELD FOR 5 DAYS BEFORE ISSUING A FINAL NEGATIVE REPORT     Performed at [facility]   Report Status PENDING   Incomplete  MRSA PCR SCREENING     Status: None   Collection Time    12/01/13 10:39 AM      Result Value Ref Range Status   MRSA by PCR NEGATIVE  NEGATIVE Final    Comment:            The GeneXpert MRSA Assay (FDA     approved for NASAL specimens     only), is one component of a     comprehensive MRSA colonization     surveillance program. It is not     intended to diagnose MRSA     infection nor to guide or     monitor treatment for     MRSA infections.  URINE CULTURE     Status: None   Collection Time    12/01/13  8:20 PM      Result Value Ref Range Status   Specimen Description URINE, CLEAN CATCH   Final   Special Requests NONE   Final   Culture  Setup Time     Final   Value: 12/01/2013 21:37     Performed at [facility] Count     Final   Value: NO GROWTH     Performed at [facility]   Culture     Final   Value: NO GROWTH     Performed at [facility]   Report Status 12/03/2013 FINAL   Final         Studies: Dg Chest Port 1 View  12/02/2013   CLINICAL DATA:  Shortness of breath  EXAM: PORTABLE CHEST - 1 VIEW  COMPARISON:  12/01/2013  FINDINGS: Stable mild cardiomegaly with central vascular congestion. Minor basilar atelectasis/ scarring. No new consolidation, collapse or focal pneumonia. No effusion or pneumothorax. Trachea midline. Prior cervical fusion hardware noted.  IMPRESSION: Stable cardiomegaly with vascular congestion and basilar atelectasis.   Electronically Signed   By: Beryle Beams  Shick M.D.   On: 12/02/2013 09:05        Scheduled Meds: . baclofen  10 mg Oral Daily  . FLUoxetine  20 mg Oral Daily  . fluticasone  1-2 spray Each Nare Daily  . fluticasone  1 puff Inhalation BID  . furosemide  40 mg Intravenous Daily  . gabapentin  600 mg Oral TID  . heparin  5,000 Units Subcutaneous 3 times per day  . insulin aspart  0-15 Units Subcutaneous TID WC  . insulin aspart  0-5 Units Subcutaneous QHS  . insulin aspart  3 Units Subcutaneous TID WC  . insulin glargine  25 Units Subcutaneous BID  . loratadine  10 mg Oral Daily  . losartan  100 mg Oral Daily  . NIFEdipine  90 mg Oral Daily    . potassium chloride  40 mEq Oral Daily  . sodium chloride  3 mL Intravenous Q12H  . spironolactone  25 mg Oral Daily  . tamsulosin  0.4 mg Oral Daily   Continuous Infusions:    Active Problems:   NSVT (nonsustained ventricular tachycardia)   SIRS (systemic inflammatory response syndrome)   Altered mental status   Hypokalemia   DM2 (diabetes mellitus, type 2)   HTN (hypertension)   Hypoxia   Acute diastolic heart failure    Time spent: 30 minutes.    Marcellus Scott, MD, FACP, FHM. [facility] Pager [phone]  If 7PM-7AM, please contact night-coverage [email or web address] Password [email or web address] 12/03/2013, 1:28 PM    LOS: 3 days

## 2013-12-03 NOTE — Progress Notes (Signed)
Subjective:    77yo WM with a history of HTN, type 2 DM, dyslipidemia and COPD with an asthma component who was admitted with AMS. Developed NSVT with albuterol in setting of K 2.8.   ECHO: LVEF normal.  K now 3.8. Trop negative.   Slowly improving clinically.  Diuresing well.  SOB is "ok".  Denies CP.  CXR + CHF  Tele: SR with frequent PVCs, no further NSVT   Intake/Output Summary (Last 24 hours) at 12/03/13 0933 Last data filed at 12/03/13 0645  Gross per 24 hour  Intake  992.5 ml  Output   2100 ml  Net -1107.5 ml     Current meds: . baclofen  10 mg Oral Daily  . FLUoxetine  20 mg Oral Daily  . fluticasone  1-2 spray Each Nare Daily  . fluticasone  1 puff Inhalation BID  . furosemide  40 mg Intravenous Daily  . gabapentin  600 mg Oral TID  . heparin  5,000 Units Subcutaneous 3 times per day  . insulin aspart  0-15 Units Subcutaneous TID WC  . insulin aspart  0-5 Units Subcutaneous QHS  . insulin aspart  3 Units Subcutaneous TID WC  . insulin glargine  25 Units Subcutaneous BID  . loratadine  10 mg Oral Daily  . losartan  100 mg Oral Daily  . NIFEdipine  90 mg Oral Daily  . potassium chloride  40 mEq Oral Daily  . sodium chloride  3 mL Intravenous Q12H  . spironolactone  25 mg Oral Daily  . tamsulosin  0.4 mg Oral Daily   Infusions:     Objective:  Blood pressure 138/95, pulse 98, temperature 98.7 F (37.1 C), temperature source Oral, resp. rate 18, height  (1.702 m), weight 244 lb 8 oz (110.904 kg), SpO2 96.00%. Weight change: 6.4 oz (0.181 kg)  Physical Exam: General exam: morbidly obese elderly male lying comfortably in bed.  Respiratory system: diffuse expiratory wheezes with prolonged expiratory phase Cardiovascular system:, RRR with ectopy. JVP hard to see but looks up.+2 edema Gastrointestinal system: Abdomen is obese nondistended, soft and nontender. Normal bowel sounds heard.  Central nervous system: Alert and oriented x4 . No focal  neurological deficits.  Extremities: Symmetric 5 x 5 power. + edema  Lab Results: Basic Metabolic Panel:  Recent Labs Lab 11/30/13 2107 12/01/13 0051 12/01/13 0226 12/01/13 0415 12/01/13 1836 12/02/13 0614 12/03/13 0357  NA 140  --  137 135* 139 141 146  K 2.8*  --  4.1 5.5* 3.3* 3.8 3.4*  CL 100  --  101 100 101 103 106  CO2 28  --   --  GLUCOSE 149*  --  140* 136* 219* 175* 163*  BUN 14  --  CREATININE 1.04  --  1.00 0.96 0.88 0.85 0.92  CALCIUM 9.0  --   --  8.3* 8.6 8.5 8.6  MG  --  2.1  --   --   --  2.4  --    Liver Function Tests:  Recent Labs Lab 11/30/13 2107  AST 42*  ALT 18  ALKPHOS 83  BILITOT 0.6  PROT 6.8  ALBUMIN 3.6   No results found for this basename: LIPASE, AMYLASE,  in the last 168 hours  Recent Labs Lab 11/30/13 2206  AMMONIA 33   CBC:  Recent Labs Lab 11/30/13 2107 12/01/13 0226 12/01/13 0415 12/02/13 0614  WBC 13.3*  --  12.0*  7.9  NEUTROABS 12.1*  --   --   --   HGB 13.1 12.9* 12.2* 11.9*  HCT 40.1 38.0* 36.6* 35.7*  MCV 93.0  --  91.0 90.8  PLT 123*  --  124* PLATELET CLUMPS NOTED ON SMEAR, COUNT APPEARS ADEQUATE   Cardiac Enzymes:  Recent Labs Lab 11/30/13 2206 12/01/13 0132 12/01/13 0222 12/01/13 0724 12/01/13 1314 12/01/13 1836 12/01/13 2250 12/02/13 0614  CKTOTAL 1384*  --  1163*  --   --  623*  --  327*  CKMB  --   --  3.2  --   --   --   --   --   TROPONINI  --  <0.30  --  <0.30 <0.30  --  <0.30  --    BNP: No components found with this basename: POCBNP,  CBG:  Recent Labs Lab 12/02/13 0753 12/02/13 1131 12/02/13 1648 12/02/13 2130 12/03/13 0621  GLUCAP 182* 277* 259* 268* 164*   Microbiology: Lab Results  Component Value Date   CULT  Value:        BLOOD CULTURE RECEIVED NO GROWTH TO DATE CULTURE WILL BE HELD FOR 5 DAYS BEFORE ISSUING A FINAL NEGATIVE REPORT Performed at [facility] 11/30/2013   CULT  Value:        BLOOD CULTURE RECEIVED NO GROWTH TO DATE  CULTURE WILL BE HELD FOR 5 DAYS BEFORE ISSUING A FINAL NEGATIVE REPORT Performed at [facility] 11/30/2013    Recent Labs Lab 11/30/13 2206 11/30/13 2215  CULT        BLOOD CULTURE RECEIVED NO GROWTH TO DATE CULTURE WILL BE HELD FOR 5 DAYS BEFORE ISSUING A FINAL NEGATIVE REPORT Performed at [facility]        BLOOD CULTURE RECEIVED NO GROWTH TO DATE CULTURE WILL BE HELD FOR 5 DAYS BEFORE ISSUING A FINAL NEGATIVE REPORT Performed at [facility]  SDES BLOOD RIGHT ARM BLOOD RIGHT HAND    Imaging: Dg Chest Port 1 View  12/02/2013   CLINICAL DATA:  Shortness of breath  EXAM: PORTABLE CHEST - 1 VIEW  COMPARISON:  12/01/2013  FINDINGS: Stable mild cardiomegaly with central vascular congestion. Minor basilar atelectasis/ scarring. No new consolidation, collapse or focal pneumonia. No effusion or pneumothorax. Trachea midline. Prior cervical fusion hardware noted.  IMPRESSION: Stable cardiomegaly with vascular congestion and basilar atelectasis.   Electronically Signed   By: Ruel Favors M.D.   On: 12/02/2013 09:05     ASSESSMENT:  1. NSVT 2. Hypokalemia 3. COPD 4. Obesity 5. Acute on chronic diastolic HF 6. Probable OSA   PLAN/DISCUSSION:  PVCs are stable and NSVT is much improved. Troponin and echo are normal. I suspect this is related to his respiratory status and OSA. No b-blocker due to asthma. Keep K >= 4.0 Mg >= 2.0  Remains volume overloaded. Suspect diastolic HF on top of COPD.  Continue to diurese with lasix and spironolactone. Watch potassium. Check BNP.  Cardiology to see as needed while here Please call with questions   LOS: 3 days    Hillis Range, MD 12/03/2013, 9:33 AM

## 2013-12-04 DIAGNOSIS — I10 Essential (primary) hypertension: Secondary | ICD-10-CM

## 2013-12-04 LAB — GLUCOSE, CAPILLARY
GLUCOSE-CAPILLARY: 169 mg/dL — AB (ref 70–99)
GLUCOSE-CAPILLARY: 190 mg/dL — AB (ref 70–99)
GLUCOSE-CAPILLARY: 216 mg/dL — AB (ref 70–99)
GLUCOSE-CAPILLARY: 298 mg/dL — AB (ref 70–99)
Glucose-Capillary: 218 mg/dL — ABNORMAL HIGH (ref 70–99)

## 2013-12-04 LAB — BASIC METABOLIC PANEL
ANION GAP: 12 (ref 5–15)
BUN: 13 mg/dL (ref 6–23)
CALCIUM: 8.4 mg/dL (ref 8.4–10.5)
CO2: 28 mEq/L (ref 19–32)
Chloride: 102 mEq/L (ref 96–112)
Creatinine, Ser: 0.76 mg/dL (ref 0.50–1.35)
GFR calc non Af Amer: 87 mL/min — ABNORMAL LOW (ref >90)
Glucose, Bld: 171 mg/dL — ABNORMAL HIGH (ref 70–99)
Potassium: 3.6 mEq/L — ABNORMAL LOW (ref 3.7–5.3)
Sodium: 142 mEq/L (ref 137–147)

## 2013-12-04 LAB — PRO B NATRIURETIC PEPTIDE: Pro B Natriuretic peptide (BNP): 1821 pg/mL — ABNORMAL HIGH (ref 0–450)

## 2013-12-04 MED ORDER — FUROSEMIDE 10 MG/ML IJ SOLN
40.0000 mg | Freq: Two times a day (BID) | INTRAMUSCULAR | Status: DC
  Administered 2013-12-04 – 2013-12-05 (×2): 40 mg via INTRAVENOUS
  Filled 2013-12-04 (×3): qty 4

## 2013-12-04 MED ORDER — FUROSEMIDE 10 MG/ML IJ SOLN
40.0000 mg | Freq: Two times a day (BID) | INTRAMUSCULAR | Status: DC
  Administered 2013-12-04: 40 mg via INTRAVENOUS
  Filled 2013-12-04: qty 4

## 2013-12-04 MED ORDER — FUROSEMIDE 10 MG/ML IJ SOLN: 40.0000 mg | Freq: Three times a day (TID) | INTRAMUSCULAR | Status: DC

## 2013-12-04 MED ORDER — INSULIN ASPART 100 UNIT/ML ~~LOC~~ SOLN
5.0000 [IU] | Freq: Three times a day (TID) | SUBCUTANEOUS | Status: DC
  Administered 2013-12-04 – 2013-12-07 (×10): 5 [IU] via SUBCUTANEOUS

## 2013-12-04 MED ORDER — POTASSIUM CHLORIDE CRYS ER 20 MEQ PO TBCR
40.0000 meq | EXTENDED_RELEASE_TABLET | Freq: Once | ORAL | Status: AC
  Administered 2013-12-04: 40 meq via ORAL
  Filled 2013-12-04: qty 2

## 2013-12-04 NOTE — Progress Notes (Signed)
SUBJECTIVE:  Not breathing at baseline.     PHYSICAL EXAM Filed Vitals:   12/03/13 1613 12/03/13 2103 12/03/13 2337 12/04/13 0528  BP:   132/71 144/61  Pulse:  94 101 98  Temp:   98.1 F (36.7 C) 98.3 F (36.8 C)  TempSrc:   Oral Oral  Resp:  Height:      Weight:    244 lb 8 oz (110.904 kg)  SpO2: 93%  96% 93%   General:  No acute distress Lungs:  Decreased breath sounds Heart:  RRR Abdomen:  Positive bowel sounds, no rebound no guarding Extremities:  Mild edema bilateral legs.   LABS:  Results for orders placed during the hospital encounter of 11/30/13 (from the past 24 hour(s))  GLUCOSE, CAPILLARY     Status: Abnormal   Collection Time    12/03/13 11:56 AM      Result Value Ref Range   Glucose-Capillary 325 (*) 70 - 99 mg/dL   Comment 1 Documented in Chart     Comment 2 Notify RN    GLUCOSE, CAPILLARY     Status: Abnormal   Collection Time    12/03/13  4:36 PM      Result Value Ref Range   Glucose-Capillary 215 (*) 70 - 99 mg/dL   Comment 1 Documented in Chart     Comment 2 Notify RN    GLUCOSE, CAPILLARY     Status: Abnormal   Collection Time    12/03/13 11:34 PM      Result Value Ref Range   Glucose-Capillary 216 (*) 70 - 99 mg/dL   Comment 1 Notify RN     Comment 2 Documented in Chart    BASIC METABOLIC PANEL     Status: Abnormal   Collection Time    12/04/13  3:39 AM      Result Value Ref Range   Sodium 142  137 - 147 mEq/L   Potassium 3.6 (*) 3.7 - 5.3 mEq/L   Chloride 102  96 - 112 mEq/L   CO2 28  19 - 32 mEq/L   Glucose, Bld 171 (*) 70 - 99 mg/dL   BUN 13  6 - 23 mg/dL   Creatinine, Ser 1.47  0.50 - 1.35 mg/dL   Calcium 8.4  8.4 - 82.9 mg/dL   GFR calc non Af Amer 87 (*) >90 mL/min   GFR calc Af Amer >90  >90 mL/min   Anion gap 12  5 - 15  PRO B NATRIURETIC PEPTIDE     Status: Abnormal   Collection Time    12/04/13  3:39 AM      Result Value Ref Range   Pro B Natriuretic peptide (BNP) 1821.0 (*) 0 - 450 pg/mL  GLUCOSE,  CAPILLARY     Status: Abnormal   Collection Time    12/04/13  5:49 AM      Result Value Ref Range   Glucose-Capillary 169 (*) 70 - 99 mg/dL   Comment 1 Notify RN     Comment 2 Documented in Chart      Intake/Output Summary (Last 24 hours) at 12/04/13 5621 Last data filed at 12/03/13 2337  Gross per 24 hour  Intake    480 ml  Output    501 ml  Net    -21 ml    ASSESSMENT AND PLAN:  NSVT:  Try to keep K over 4.  (I took the liberty of adding additional Kdur today).  DIASTOLIC HF:     I/O probably incomplete.  Still seems to be volume overloaded.    Discussed the need to keep his feet elevated.  Increase Lasix to tid.    Fayrene Fearing [facility] 12/04/2013 9:03 AM

## 2013-12-04 NOTE — Progress Notes (Signed)
Physical Therapy Treatment Patient Details Name: Samuel Thomas MRN: [id number] DOB: January 06, 1936 Today's Date: 12/04/2013  SATURATION QUALIFICATIONS: (This note is used to comply with regulatory documentation for home oxygen)  Patient Saturations on Room Air at Rest = 95%  Patient Saturations on Room Air while Ambulating = 92%    History of Present Illness 78 year old male with history of HTN, DM 2, HLD, COPD not on home oxygen, OSA on nightly CPAP, morbid obesity, was brought to the ED on 12/01/13 secondary to confusion. He is from [facility] and was found outside his car in [place], confused and had urinated on himself. Bystanders summoned EMS. Mental status in the ED had improved slightly. He was noted to have fever but chest x-ray was negative. He was noted to have several runs of NSVT in the context of hypokalemia and nebulizations. Cardiology was consulted. Patient states that he fell off his bed on 8/19 and hurt his back and laid on the floor for a few hours. Next morning he was able to crawl to the bathroom and take a shower with improvement in his back pain. Yesterday he went out shopping for clothes in preparation for his daughter's wedding in a month and states that he might have been out in the hot sun.    PT Comments    Increased distance this session however pt c/o low back pain (chronic) 10/10. Pt spoke with RN about pain meds.   Follow Up Recommendations  Home health PT;Supervision for mobility/OOB     Equipment Recommendations  None recommended by PT    Recommendations for Other Services       Precautions / Restrictions Precautions Precautions: Fall Restrictions Weight Bearing Restrictions: No    Mobility  Bed Mobility                  Transfers Overall transfer level: Needs assistance Equipment used: None Transfers: Sit to/from Stand Sit to Stand: Supervision            Ambulation/Gait Ambulation/Gait assistance: Min guard Ambulation Distance  (Feet): 175 Feet Assistive device: None (intermittently held onto dynamap/BP machine) Gait Pattern/deviations: Step-through pattern;Decreased stride length;Trunk flexed     General Gait Details: slow gait speed. Pt reported increased back pain with ambulation. close guard for safety. 1 brief standing rest break midway. O2 sats 92% on RA   Stairs            Wheelchair Mobility    Modified Rankin (Stroke Patients Only)       Balance                                    Cognition Arousal/Alertness: Awake/alert Behavior During Therapy: WFL for tasks assessed/performed Overall Cognitive Status: Within Functional Limits for tasks assessed                      Exercises      General Comments        Pertinent Vitals/Pain Pain Assessment: 0-10 Pain Score: 10-Worst pain ever Pain Location: low back L side Pain Intervention(s): Patient requesting pain meds-RN notified;Repositioned    Home Living                      Prior Function            PT Goals (current goals can now be found in the care plan section) Progress towards PT  goals: Progressing toward goals    Frequency  Min 3X/week    PT Plan Current plan remains appropriate    Co-evaluation             End of Session Equipment Utilized During Treatment: Gait belt Activity Tolerance: Patient tolerated treatment well Patient left: in chair;with call bell/phone within reach     Time: 1202-1218 PT Time Calculation (min): 16 min  Charges:  $Gait Training: 8-22 mins                    G Codes:      Rebeca Alert, MPT Pager: [phone]

## 2013-12-04 NOTE — Progress Notes (Signed)
PROGRESS NOTE    Samuel Thomas:[id number] DOB: 12-15-1935 DOA: 11/30/2013 PCP: No primary provider on file.  HPI/Brief narrative 78 year old male with history of HTN, DM 2, HLD, COPD, not on home oxygen, OSA on nightly CPAP, morbid obesity, was brought to the emergency department on 12/01/13 secondary to confusion. He is from [facility] and was found outside his car in [place], confused and had urinated on himself. Bystanders summoned EMS. Mental status in the ED had improved slightly. He was noted to have fever but every morning chest x-ray were negative. He was noted to have several runs of NSVT in the context of hypokalemia and nebulizations. Cardiology was consulted. Patient states that he fell off his bed on 8/19 and hurt his back and laid on the floor for a few hours. Next morning he was able to crawl to the bathroom and take a shower with improvement in his back pain. Yesterday he went out shopping for close in preparation for his daughter's wedding and a month and states that he might have been out in the hot sun. He complains of ongoing dysuria for months but denies fever, chills, sore throat, headache, neck pain, earache, cough, dyspnea, nausea, vomiting or diarrhea.   Assessment/Plan:  1. Acute encephalopathy: Unclear etiology. Patient denies using any pain medications.? Related to febrile illness, dehydration and heat exposure, hypoxia. No focal deficits. CT head negative. Resolved. Patient denies significant alcohol use. Monitor CIWA - 0. ? History of dementia per family/friends report. 2. NSVT: Likely secondary to bronchodilator nebulizations in the context of hypokalemia. Monitor on telemetry. Avoid bronchodilators as much as possible. Keep potassium >4 and magnesium >2. 2-D echo-poor study but appears to have normal EF. TSH: Normal. Avoid beta blockers secondary to ?asthma/COPD. No NSVT in last 24 hours. 3. Acute on chronic diastolic CHF: Cardiology starting IV Lasix and Aldactone.  Intake and output and weights likely inaccurate. Still significantly volume overloaded. Increase Lasix from 40 mg IV daily to twice a day. 4. Febrile illness/SIRS: Unclear etiology. Except dysuria no symptoms to suggest source. Initial UA and chest x-ray unremarkable. ? Secondary to heat exposure. Started on empiric broad-spectrum IV vancomycin and Zosyn. Blood cultures x2-negative to date. Urine culture negative. DC'ed antibiotics and monitor. 5. Mild rhabdomyolysis/s/p mechanical fall: Mild back pain. Brief IV fluids and follow CK-improved. Held statins. PT evaluation. 6. OSA on nightly CPAP/? OHS: Not on home oxygen. Stable. Continue CPAP. 7. Hypoxia: Seen on initial ABG. May be related to mental status change in the context of OSA. Resolved. Saturating in the mid 90s on room air. Monitor 8. Hypertension: Controlled continue nifedipine and ARB. 9. Uncontrolled type II DM: Continue Lantus and SSI. Hold metformin. Adjust insulins. Add mealtime NovoLog. A1c: 6.8. Better controlled. 10. Hypokalemia/hyperkalemia: Replace as needed and keep >4 11. BPH: His urinary symptoms may be related to this. Repeat urine microscopy-not consistent with UTI. Continue Flomax.  12. Mild anemia and thrombocytopenia:? Chronic thrombocytopenia. Hemoglobin stable. Platelets clumped but counts appear okay.   Code Status: Full Family Communication: Discussed with Ms. Margot Janajar/HCPOA on 8/23. Disposition Plan: Home when medically stable- possibly next 1-2 days.   Consultants:  Cardiology  Procedures:  None  Antibiotics:  IV Zosyn 8/21 > 8/23  IV vancomycin 8/21 > 8/22  Subjective: States DOE unchanged.  Objective: Filed Vitals:   12/03/13 2103 12/03/13 2337 12/04/13 0528 12/04/13 0928  BP:  132/71 144/61   Pulse: 94 101 98   Temp:  98.1 F (36.7 C) 98.3 F (36.8  C)   TempSrc:  Oral Oral   Resp: Height:      Weight:   110.904 kg (244 lb 8 oz)   SpO2:  96% 93% 95%     Intake/Output Summary (Last 24 hours) at 12/04/13 1046 Last data filed at 12/04/13 1033  Gross per 24 hour  Intake   1203 ml  Output   1401 ml  Net   -198 ml   Filed Weights   12/02/13 1500 12/03/13 0500 12/04/13 0528  Weight: 112.447 kg (247 lb 14.4 oz) 110.904 kg (244 lb 8 oz) 110.904 kg (244 lb 8 oz)     Exam:  General exam:  moderately built and morbidly obese elderly sitting up comfortably in chair. Respiratory system: Few basal crackles. Rest of lung fields clear to auscultation. No increased work of breathing. Cardiovascular system: S1 & S2 heard, RRR. No JVD, murmurs, gallops, clicks. Trace bilateral ankle edema . Telemetry: Sinus rhythm with frequent PVCs. No NSVT's in last 24 hours.. Gastrointestinal system: Abdomen is nondistended, soft and nontender. Normal bowel sounds heard. Central nervous system: Alert and oriented x4 . No focal neurological deficits. Extremities: Symmetric 5 x 5 power.  Musculoskeletal: No spinal tenderness or deformity    Data Reviewed: Basic Metabolic Panel:  Recent Labs Lab 11/30/13 2107 12/01/13 0051  12/01/13 0415 12/01/13 1836 12/02/13 0614 12/03/13 0357 12/04/13 0339  NA 140  --   < > 135* 139 141 146 142  K 2.8*  --   < > 5.5* 3.3* 3.8 3.4* 3.6*  CL 100  --   < > 100 101 103 106 102  CO2 28  --   --  GLUCOSE 149*  --   < > 136* 219* 175* 163* 171*  BUN 14  --   < > CREATININE 1.04  --   < > 0.96 0.88 0.85 0.92 0.76  CALCIUM 9.0  --   --  8.3* 8.6 8.5 8.6 8.4  MG  --  2.1  --   --   --  2.4  --   --   < > = values in this interval not displayed. Liver Function Tests:  Recent Labs Lab 11/30/13 2107  AST 42*  ALT 18  ALKPHOS 83  BILITOT 0.6  PROT 6.8  ALBUMIN 3.6   No results found for this basename: LIPASE, AMYLASE,  in the last 168 hours  Recent Labs Lab 11/30/13 2206  AMMONIA 33   CBC:  Recent Labs Lab 11/30/13 2107 12/01/13 0226 12/01/13 0415 12/02/13 0614  WBC  13.3*  --  12.0* 7.9  NEUTROABS 12.1*  --   --   --   HGB 13.1 12.9* 12.2* 11.9*  HCT 40.1 38.0* 36.6* 35.7*  MCV 93.0  --  91.0 90.8  PLT 123*  --  124* PLATELET CLUMPS NOTED ON SMEAR, COUNT APPEARS ADEQUATE   Cardiac Enzymes:  Recent Labs Lab 11/30/13 2206 12/01/13 0132 12/01/13 0222 12/01/13 0724 12/01/13 1314 12/01/13 1836 12/01/13 2250 12/02/13 0614  CKTOTAL 1384*  --  1163*  --   --  623*  --  327*  CKMB  --   --  3.2  --   --   --   --   --   TROPONINI  --  <0.30  --  <0.30 <0.30  --  <0.30  --    BNP (last 3 results)  Recent Labs  12/03/13 0357 12/04/13 0339  PROBNP 2581.0* 1821.0*   CBG:  Recent Labs Lab 12/03/13 0621 12/03/13 1156 12/03/13 1636 12/03/13 2334 12/04/13 0549  GLUCAP 164* 325* 215* 216* 169*    Recent Results (from the past 240 hour(s))  CULTURE, BLOOD (ROUTINE X 2)     Status: None   Collection Time    11/30/13 10:06 PM      Result Value Ref Range Status   Specimen Description BLOOD RIGHT ARM   Final   Special Requests BOTTLES DRAWN AEROBIC AND ANAEROBIC 4CC   Final   Culture  Setup Time     Final   Value: 12/01/2013 05:04     Performed at [facility]   Culture     Final   Value:        BLOOD CULTURE RECEIVED NO GROWTH TO DATE CULTURE WILL BE HELD FOR 5 DAYS BEFORE ISSUING A FINAL NEGATIVE REPORT     Performed at [facility]   Report Status PENDING   Incomplete  CULTURE, BLOOD (ROUTINE X 2)     Status: None   Collection Time    11/30/13 10:15 PM      Result Value Ref Range Status   Specimen Description BLOOD RIGHT HAND   Final   Special Requests BOTTLES DRAWN AEROBIC AND ANAEROBIC 6CC   Final   Culture  Setup Time     Final   Value: 12/01/2013 05:04     Performed at [facility]   Culture     Final   Value:        BLOOD CULTURE RECEIVED NO GROWTH TO DATE CULTURE WILL BE HELD FOR 5 DAYS BEFORE ISSUING A FINAL NEGATIVE REPORT     Performed at [facility]   Report Status PENDING   Incomplete   MRSA PCR SCREENING     Status: None   Collection Time    12/01/13 10:39 AM      Result Value Ref Range Status   MRSA by PCR NEGATIVE  NEGATIVE Final   Comment:            The GeneXpert MRSA Assay (FDA     approved for NASAL specimens     only), is one component of a     comprehensive MRSA colonization     surveillance program. It is not     intended to diagnose MRSA     infection nor to guide or     monitor treatment for     MRSA infections.  URINE CULTURE     Status: None   Collection Time    12/01/13  8:20 PM      Result Value Ref Range Status   Specimen Description URINE, CLEAN CATCH   Final   Special Requests NONE   Final   Culture  Setup Time     Final   Value: 12/01/2013 21:37     Performed at [facility] Count     Final   Value: NO GROWTH     Performed at [facility]   Culture     Final   Value: NO GROWTH     Performed at [facility]   Report Status 12/03/2013 FINAL   Final         Studies: No results found.      Scheduled Meds: . baclofen  10 mg Oral Daily  . FLUoxetine  20 mg Oral Daily  . fluticasone  1-2 spray Each Nare Daily  . fluticasone  1 puff Inhalation BID  . furosemide  40 mg Intravenous BID  . gabapentin  600 mg Oral TID  . heparin  5,000 Units Subcutaneous 3 times per day  . insulin aspart  0-15 Units Subcutaneous TID WC  . insulin aspart  0-5 Units Subcutaneous QHS  . insulin aspart  3 Units Subcutaneous TID WC  . insulin glargine  25 Units Subcutaneous BID  . loratadine  10 mg Oral Daily  . losartan  100 mg Oral Daily  . NIFEdipine  90 mg Oral Daily  . potassium chloride  40 mEq Oral Daily  . potassium chloride  40 mEq Oral Once  . sodium chloride  3 mL Intravenous Q12H  . spironolactone  25 mg Oral Daily  . tamsulosin  0.4 mg Oral Daily   Continuous Infusions:    Active Problems:   NSVT (nonsustained ventricular tachycardia)   SIRS (systemic inflammatory response syndrome)   Altered  mental status   Hypokalemia   DM2 (diabetes mellitus, type 2)   HTN (hypertension)   Hypoxia   Acute diastolic heart failure    Time spent: 25 minutes.    Marcellus Scott, MD, FACP, FHM. [facility] Pager [phone]  If 7PM-7AM, please contact night-coverage [email or web address] Password [email or web address] 12/04/2013, 10:46 AM    LOS: 4 days

## 2013-12-05 LAB — GLUCOSE, CAPILLARY
GLUCOSE-CAPILLARY: 170 mg/dL — AB (ref 70–99)
GLUCOSE-CAPILLARY: 187 mg/dL — AB (ref 70–99)
Glucose-Capillary: 116 mg/dL — ABNORMAL HIGH (ref 70–99)
Glucose-Capillary: 174 mg/dL — ABNORMAL HIGH (ref 70–99)

## 2013-12-05 LAB — BASIC METABOLIC PANEL
ANION GAP: 12 (ref 5–15)
BUN: 16 mg/dL (ref 6–23)
CO2: 28 mEq/L (ref 19–32)
CREATININE: 0.88 mg/dL (ref 0.50–1.35)
Calcium: 9.3 mg/dL (ref 8.4–10.5)
Chloride: 101 mEq/L (ref 96–112)
GFR calc non Af Amer: 82 mL/min — ABNORMAL LOW (ref >90)
Glucose, Bld: 122 mg/dL — ABNORMAL HIGH (ref 70–99)
POTASSIUM: 3.7 meq/L (ref 3.7–5.3)
Sodium: 141 mEq/L (ref 137–147)

## 2013-12-05 LAB — CK: Total CK: 65 U/L (ref 7–232)

## 2013-12-05 MED ORDER — ATORVASTATIN CALCIUM 20 MG PO TABS
20.0000 mg | ORAL_TABLET | Freq: Every day | ORAL | Status: DC
  Administered 2013-12-05 – 2013-12-06 (×2): 20 mg via ORAL
  Filled 2013-12-05 (×3): qty 1

## 2013-12-05 MED ORDER — FUROSEMIDE 10 MG/ML IJ SOLN
40.0000 mg | Freq: Three times a day (TID) | INTRAMUSCULAR | Status: DC
  Administered 2013-12-05 (×2): 40 mg via INTRAVENOUS
  Filled 2013-12-05 (×3): qty 4

## 2013-12-05 MED ORDER — POTASSIUM CHLORIDE CRYS ER 20 MEQ PO TBCR
40.0000 meq | EXTENDED_RELEASE_TABLET | Freq: Two times a day (BID) | ORAL | Status: DC
  Administered 2013-12-05 (×2): 40 meq via ORAL
  Filled 2013-12-05 (×3): qty 2

## 2013-12-05 MED ORDER — POTASSIUM CHLORIDE CRYS ER 20 MEQ PO TBCR
20.0000 meq | EXTENDED_RELEASE_TABLET | Freq: Once | ORAL | Status: AC
  Administered 2013-12-05: 20 meq via ORAL
  Filled 2013-12-05: qty 1

## 2013-12-05 NOTE — ED Provider Notes (Signed)
Medical screening examination/treatment/procedure(s) were performed by non-physician practitioner and as supervising physician I was immediately available for consultation/collaboration.   EKG Interpretation   Date/Time:  Friday December 01 2013 01:02:13 EDT Ventricular Rate:  97 PR Interval:  197 QRS Duration: 102 QT Interval:  330 QTC Calculation: 419 R Axis:   19 Text Interpretation:  Sinus tachycardia Paired ventricular premature  complexes Borderline low voltage, extremity leads Nonspecific T  abnormalities, lateral leads Minimal ST elevation, inferior leads ED  PHYSICIAN INTERPRETATION AVAILABLE IN [facility] Confirmed by TEST,  Record ([id number]) on 12/03/2013 1:54:44 PM       Raeford Razor, MD 12/05/13 [phone]

## 2013-12-05 NOTE — Progress Notes (Signed)
Entered patients room to place on CPAP patient stated he places himself on CPAP before sleeping.

## 2013-12-05 NOTE — Progress Notes (Signed)
    SUBJECTIVE:  Not breathing at baseline.  Slept in the chair.  Not keeping his feet elevated.    PHYSICAL EXAM Filed Vitals:   12/04/13 1946 12/04/13 2035 12/05/13 0231 12/05/13 0655  BP:  156/69  149/65  Pulse: 95 92 89 88  Temp:  98.7 F (37.1 C)  98.1 F (36.7 C)  TempSrc:  Oral  Oral  Resp: Height:      Weight:    240 lb 6.4 oz (109.045 kg)  SpO2: 93% 95% 91% 94%   General:  No acute distress Lungs:  Decreased breath sounds Heart:  RRR Abdomen:  Positive bowel sounds, no rebound no guarding Extremities:  Mild edema bilateral legs.   LABS:  Results for orders placed during the hospital encounter of 11/30/13 (from the past 24 hour(s))  GLUCOSE, CAPILLARY     Status: Abnormal   Collection Time    12/04/13 11:08 AM      Result Value Ref Range   Glucose-Capillary 190 (*) 70 - 99 mg/dL   Comment 1 Notify RN    GLUCOSE, CAPILLARY     Status: Abnormal   Collection Time    12/04/13  5:13 PM      Result Value Ref Range   Glucose-Capillary 218 (*) 70 - 99 mg/dL   Comment 1 Notify RN    GLUCOSE, CAPILLARY     Status: Abnormal   Collection Time    12/04/13  9:17 PM      Result Value Ref Range   Glucose-Capillary 298 (*) 70 - 99 mg/dL   Comment 1 Notify RN     Comment 2 Documented in Chart    BASIC METABOLIC PANEL     Status: Abnormal   Collection Time    12/05/13  4:09 AM      Result Value Ref Range   Sodium 141  137 - 147 mEq/L   Potassium 3.7  3.7 - 5.3 mEq/L   Chloride 101  96 - 112 mEq/L   CO2 28  19 - 32 mEq/L   Glucose, Bld 122 (*) 70 - 99 mg/dL   BUN 16  6 - 23 mg/dL   Creatinine, Ser 1.61  0.50 - 1.35 mg/dL   Calcium 9.3  8.4 - 09.6 mg/dL   GFR calc non Af Amer 82 (*) >90 mL/min   GFR calc Af Amer >90  >90 mL/min   Anion gap 12  5 - 15  GLUCOSE, CAPILLARY     Status: Abnormal   Collection Time    12/05/13  6:46 AM      Result Value Ref Range   Glucose-Capillary 116 (*) 70 - 99 mg/dL   Comment 1 Notify RN     Comment 2 Documented in  Chart      Intake/Output Summary (Last 24 hours) at 12/05/13 0825 Last data filed at 12/05/13 0655  Gross per 24 hour  Intake   1203 ml  Output   3190 ml  Net  -1987 ml    ASSESSMENT AND PLAN:  NSVT:  Try to keep K over 4.  I will give a total of 100 meq today and change to 40 bid KDur.    DIASTOLIC HF:     Still seems to be volume overloaded.    Discussed the need to keep his feet elevated.  Increase Lasix to bid yesterday.  Continue today.   Fayrene Fearing Marelyn Rouser 12/05/2013 8:25 AM

## 2013-12-05 NOTE — Progress Notes (Signed)
PROGRESS NOTE    Samuel Thomas ZOX:[id number] DOB: 01-14-1936 DOA: 11/30/2013 PCP: No primary provider on file.  HPI/Brief narrative 78 year old male with history of HTN, DM 2, HLD, COPD, not on home oxygen, OSA on nightly CPAP, morbid obesity, was brought to the emergency department on 12/01/13 secondary to confusion. He is from [facility] and was found outside his car in [place], confused and had urinated on himself. Bystanders summoned EMS. Febrile illness felt to be secondary to heat exposure. Infection work up negative. Afebrile and has been off antibiotics for days. Acute encephalopathy likely secondary to fever, has resolved. NSVT attributed to hypokalemia, albuterol and OSA-improved. Currently on IV Lasix for acute on chronic diastolic CHF and still significantly volume overloaded. Cardiology following. Also with recent fall at home with mild rhabdomyolysis-resolved.   Assessment/Plan:  1. Acute encephalopathy: Likely related to febrile illness, dehydration and heat exposure, hypoxia. No focal deficits. CT head negative. Resolved. ? History of dementia per family/friends report. 2. NSVT: Likely secondary to bronchodilator nebulizations in the context of hypokalemia. Avoid bronchodilators as much as possible. Keep potassium >4 and magnesium >2. 2-D echo-poor study but appears to have normal EF. TSH: Normal. Avoid beta blockers secondary to ?asthma/COPD. No NSVT in last 48 hours. Continue to monitor on telemetry. 3. Acute on chronic diastolic CHF: Cardiology following. Patient initiated on IV Lasix and by mouth Aldactone. Weight and intake output recording not accurate. Continues to complain of dyspnea. Lasix increased to 40 mg IV TID on 8/25. Monitor renal functions closely. 4. Febrile illness/SIRS: Unclear etiology. Except dysuria no symptoms to suggest source. Initial UA and chest x-ray unremarkable. ? Secondary to heat exposure. Started on empiric broad-spectrum IV vancomycin and Zosyn. Blood  cultures x2-negative to date. Urine culture negative. DC'ed antibiotics and monitor. 5. Mild rhabdomyolysis/s/p mechanical fall: Mild back pain. Brief IV fluids and resolved. Resume statins that were held. 6. OSA on nightly CPAP/? OHS: Not on home oxygen. Stable. Continue CPAP. 7. Hypoxia: Seen on initial ABG. May be related to mental status change in the context of OSA. Resolved. Patient was saturating between 92-95% on room air at rest and with activity respectively.  8. Hypertension: Controlled continue nifedipine and ARB. 9. Uncontrolled type II DM: Continue Lantus and SSI. Hold metformin. Adjust insulins. Add mealtime NovoLog. A1c: 6.8. Better controlled. 10. Hypokalemia/hyperkalemia: Replace as needed and keep >4 11. BPH: His urinary symptoms may be related to this. Repeat urine microscopy-not consistent with UTI. Continue Flomax.  12. Mild anemia and thrombocytopenia:? Chronic thrombocytopenia. Hemoglobin stable. Platelets clumped but counts appear okay.   Code Status: Full Family Communication: Discussed with Ms. Margot Janajar/HCPOA on 8/23. Disposition Plan: Home when medically stable- possibly next 1-2 days.   Consultants:  Cardiology  Procedures:  None  Antibiotics:  IV Zosyn 8/21 > 8/23  IV vancomycin 8/21 > 8/22  Subjective: States DOE unchanged. Leg swelling improving.  Objective: Filed Vitals:   12/05/13 0840 12/05/13 1007 12/05/13 1015 12/05/13 1353  BP: 138/60  121/64 130/59  Pulse:    95  Temp: 98.6 F (37 C)   99.8 F (37.7 C)  TempSrc: Oral   Oral  Resp: 18     Height:      Weight:      SpO2: 94% 95%  92%    Intake/Output Summary (Last 24 hours) at 12/05/13 1402 Last data filed at 12/05/13 1121  Gross per 24 hour  Intake    463 ml  Output   2590 ml  Net  -  2127 ml   Filed Weights   12/03/13 0500 12/04/13 0528 12/05/13 0655  Weight: 110.904 kg (244 lb 8 oz) 110.904 kg (244 lb 8 oz) 109.045 kg (240 lb 6.4 oz)     Exam:  General exam:   moderately built and morbidly obese elderly sitting up comfortably in chair. Respiratory system: Few basal crackles. Rest of lung fields clear to auscultation. No increased work of breathing. Cardiovascular system: S1 & S2 heard, RRR. No JVD, murmurs, gallops, clicks. Trace bilateral ankle edema . Telemetry: Sinus rhythm with frequent PVCs. No NSVT's in last 48 hours.. Gastrointestinal system: Abdomen is nondistended, soft and nontender. Normal bowel sounds heard. Central nervous system: Alert and oriented x4 . No focal neurological deficits. Extremities: Symmetric 5 x 5 power.  Musculoskeletal: No spinal tenderness or deformity    Data Reviewed: Basic Metabolic Panel:  Recent Labs Lab 11/30/13 2107 12/01/13 0051  12/01/13 1836 12/02/13 0614 12/03/13 0357 12/04/13 0339 12/05/13 0409  NA 140  --   < > 139 141 146 142 141  K 2.8*  --   < > 3.3* 3.8 3.4* 3.6* 3.7  CL 100  --   < > 101 103 106 102 101  CO2 28  --   < > GLUCOSE 149*  --   < > 219* 175* 163* 171* 122*  BUN 14  --   < > CREATININE 1.04  --   < > 0.88 0.85 0.92 0.76 0.88  CALCIUM 9.0  --   < > 8.6 8.5 8.6 8.4 9.3  MG  --  2.1  --   --  2.4  --   --   --   < > = values in this interval not displayed. Liver Function Tests:  Recent Labs Lab 11/30/13 2107  AST 42*  ALT 18  ALKPHOS 83  BILITOT 0.6  PROT 6.8  ALBUMIN 3.6   No results found for this basename: LIPASE, AMYLASE,  in the last 168 hours  Recent Labs Lab 11/30/13 2206  AMMONIA 33   CBC:  Recent Labs Lab 11/30/13 2107 12/01/13 0226 12/01/13 0415 12/02/13 0614  WBC 13.3*  --  12.0* 7.9  NEUTROABS 12.1*  --   --   --   HGB 13.1 12.9* 12.2* 11.9*  HCT 40.1 38.0* 36.6* 35.7*  MCV 93.0  --  91.0 90.8  PLT 123*  --  124* PLATELET CLUMPS NOTED ON SMEAR, COUNT APPEARS ADEQUATE   Cardiac Enzymes:  Recent Labs Lab 11/30/13 2206 12/01/13 0132 12/01/13 0222 12/01/13 0724 12/01/13 1314 12/01/13 1836 12/01/13 2250  12/02/13 0614 12/05/13 0409  CKTOTAL 1384*  --  1163*  --   --  623*  --  327* 65  CKMB  --   --  3.2  --   --   --   --   --   --   TROPONINI  --  <0.30  --  <0.30 <0.30  --  <0.30  --   --    BNP (last 3 results)  Recent Labs  12/03/13 0357 12/04/13 0339  PROBNP 2581.0* 1821.0*   CBG:  Recent Labs Lab 12/04/13 1108 12/04/13 1713 12/04/13 2117 12/05/13 0646 12/05/13 1035  GLUCAP 190* 218* 298* 116* 174*    Recent Results (from the past 240 hour(s))  CULTURE, BLOOD (ROUTINE X 2)     Status: None   Collection Time    11/30/13 10:06 PM  Result Value Ref Range Status   Specimen Description BLOOD RIGHT ARM   Final   Special Requests BOTTLES DRAWN AEROBIC AND ANAEROBIC 4CC   Final   Culture  Setup Time     Final   Value: 12/01/2013 05:04     Performed at [facility]   Culture     Final   Value:        BLOOD CULTURE RECEIVED NO GROWTH TO DATE CULTURE WILL BE HELD FOR 5 DAYS BEFORE ISSUING A FINAL NEGATIVE REPORT     Performed at [facility]   Report Status PENDING   Incomplete  CULTURE, BLOOD (ROUTINE X 2)     Status: None   Collection Time    11/30/13 10:15 PM      Result Value Ref Range Status   Specimen Description BLOOD RIGHT HAND   Final   Special Requests BOTTLES DRAWN AEROBIC AND ANAEROBIC 6CC   Final   Culture  Setup Time     Final   Value: 12/01/2013 05:04     Performed at [facility]   Culture     Final   Value:        BLOOD CULTURE RECEIVED NO GROWTH TO DATE CULTURE WILL BE HELD FOR 5 DAYS BEFORE ISSUING A FINAL NEGATIVE REPORT     Performed at [facility]   Report Status PENDING   Incomplete  MRSA PCR SCREENING     Status: None   Collection Time    12/01/13 10:39 AM      Result Value Ref Range Status   MRSA by PCR NEGATIVE  NEGATIVE Final   Comment:            The GeneXpert MRSA Assay (FDA     approved for NASAL specimens     only), is one component of a     comprehensive MRSA colonization     surveillance  program. It is not     intended to diagnose MRSA     infection nor to guide or     monitor treatment for     MRSA infections.  URINE CULTURE     Status: None   Collection Time    12/01/13  8:20 PM      Result Value Ref Range Status   Specimen Description URINE, CLEAN CATCH   Final   Special Requests NONE   Final   Culture  Setup Time     Final   Value: 12/01/2013 21:37     Performed at [facility] Count     Final   Value: NO GROWTH     Performed at [facility]   Culture     Final   Value: NO GROWTH     Performed at [facility]   Report Status 12/03/2013 FINAL   Final         Studies: No results found.      Scheduled Meds: . baclofen  10 mg Oral Daily  . FLUoxetine  20 mg Oral Daily  . fluticasone  1-2 spray Each Nare Daily  . fluticasone  1 puff Inhalation BID  . furosemide  40 mg Intravenous TID  . gabapentin  600 mg Oral TID  . heparin  5,000 Units Subcutaneous 3 times per day  . insulin aspart  0-15 Units Subcutaneous TID WC  . insulin aspart  0-5 Units Subcutaneous QHS  . insulin aspart  5 Units Subcutaneous TID WC  .  insulin glargine  25 Units Subcutaneous BID  . loratadine  10 mg Oral Daily  . losartan  100 mg Oral Daily  . NIFEdipine  90 mg Oral Daily  . potassium chloride  40 mEq Oral BID  . sodium chloride  3 mL Intravenous Q12H  . spironolactone  25 mg Oral Daily  . tamsulosin  0.4 mg Oral Daily   Continuous Infusions:    Active Problems:   NSVT (nonsustained ventricular tachycardia)   SIRS (systemic inflammatory response syndrome)   Altered mental status   Hypokalemia   DM2 (diabetes mellitus, type 2)   HTN (hypertension)   Hypoxia   Acute diastolic heart failure    Time spent: 25 minutes.    Marcellus Scott, MD, FACP, FHM. [facility] Pager [phone]  If 7PM-7AM, please contact night-coverage [email or web address] Password [email or web address] 12/05/2013, 2:02 PM    LOS: 5 days

## 2013-12-06 DIAGNOSIS — R4182 Altered mental status, unspecified: Secondary | ICD-10-CM

## 2013-12-06 DIAGNOSIS — E119 Type 2 diabetes mellitus without complications: Secondary | ICD-10-CM

## 2013-12-06 DIAGNOSIS — I509 Heart failure, unspecified: Secondary | ICD-10-CM

## 2013-12-06 DIAGNOSIS — I5033 Acute on chronic diastolic (congestive) heart failure: Secondary | ICD-10-CM

## 2013-12-06 LAB — BASIC METABOLIC PANEL
ANION GAP: 12 (ref 5–15)
BUN: 19 mg/dL (ref 6–23)
CALCIUM: 9.4 mg/dL (ref 8.4–10.5)
CO2: 29 mEq/L (ref 19–32)
Chloride: 101 mEq/L (ref 96–112)
Creatinine, Ser: 1.17 mg/dL (ref 0.50–1.35)
GFR calc non Af Amer: 59 mL/min — ABNORMAL LOW (ref >90)
GFR, EST AFRICAN AMERICAN: 69 mL/min — AB (ref >90)
Glucose, Bld: 162 mg/dL — ABNORMAL HIGH (ref 70–99)
Potassium: 5 mEq/L (ref 3.7–5.3)
SODIUM: 142 meq/L (ref 137–147)

## 2013-12-06 LAB — GLUCOSE, CAPILLARY
Glucose-Capillary: 138 mg/dL — ABNORMAL HIGH (ref 70–99)
Glucose-Capillary: 226 mg/dL — ABNORMAL HIGH (ref 70–99)
Glucose-Capillary: 213 mg/dL — ABNORMAL HIGH (ref 70–99)
Glucose-Capillary: 168 mg/dL — ABNORMAL HIGH (ref 70–99)

## 2013-12-06 LAB — CBC
HCT: 36.3 % — ABNORMAL LOW (ref 39.0–52.0)
Hemoglobin: 11.8 g/dL — ABNORMAL LOW (ref 13.0–17.0)
MCH: 29.8 pg (ref 26.0–34.0)
MCHC: 32.5 g/dL (ref 30.0–36.0)
MCV: 91.7 fL (ref 78.0–100.0)
PLATELETS: 132 10*3/uL — AB (ref 150–400)
RBC: 3.96 MIL/uL — ABNORMAL LOW (ref 4.22–5.81)
RDW: 13.5 % (ref 11.5–15.5)
WBC: 5.7 10*3/uL (ref 4.0–10.5)

## 2013-12-06 MED ORDER — POTASSIUM CHLORIDE CRYS ER 20 MEQ PO TBCR
40.0000 meq | EXTENDED_RELEASE_TABLET | Freq: Every day | ORAL | Status: DC
Start: 2013-12-06 — End: 2013-12-07
  Administered 2013-12-06 – 2013-12-07 (×2): 40 meq via ORAL
  Filled 2013-12-06: qty 2

## 2013-12-06 MED ORDER — FUROSEMIDE 80 MG PO TABS
80.0000 mg | ORAL_TABLET | Freq: Every day | ORAL | Status: DC
  Administered 2013-12-06 – 2013-12-07 (×2): 80 mg via ORAL
  Filled 2013-12-06 (×2): qty 1

## 2013-12-06 NOTE — Care Management Note (Addendum)
  Page 2 of 2   12/06/2013     4:04:20 PM CARE MANAGEMENT NOTE 12/06/2013  Patient:  Samuel Thomas, Samuel Thomas   Account Number:  [id number]  Date Initiated:  12/01/2013  Documentation initiated by:  Junius Creamer  Subjective/Objective Assessment:   adm w nsvt, ams, fever     Action/Plan:   CM to follow for disposition needs   Anticipated DC Date:  12/07/2013   Anticipated DC Plan:  HOME W HOME HEALTH SERVICES      DC Planning Services  CM consult      [facility] Choice  HOME HEALTH   Choice offered to / List presented to:  C-1 Patient        HH arranged  HH-1 RN  HH-10 DISEASE MANAGEMENT  HH-2 PT      HH agency  [facility]   Status of service:  Completed, signed off Medicare Important Message given?  YES (If response is "NO", the following Medicare IM given date fields will be blank) Date Medicare IM given:  12/06/2013 Medicare IM given by:  Tosh Glaze Date Additional Medicare IM given:   Additional Medicare IM given by:    Discharge Disposition:  HOME W HOME HEALTH SERVICES  Per UR Regulation:  Reviewed for med. necessity/level of care/duration of stay  If discussed at Long Length of Stay Meetings, dates discussed:   12/05/2013  12/07/2013    Comments:  Aggie Cosier Braxston Quinter RN, BSN, MSHL, CCM  Nurse - Case Manager,  ([facility] [place])  [phone]  12/06/2013 ADM:  AMS, Fever, Hypoxic appears to be due to combination of Asthma / COPD as well as sleep apnea Febrile Illness (SIRS), Diastolic CHF, Acute enchepalopathy likely r/t febrile illness, dehydration and heat exposure v Viral From [place] found outside his car by bystanders who called 911; unclear how long patient had been exposed to the heat.   Patient states he recalls felling confused and pulled off the road at the [employer] and sat down under a shade tree. Social:  from home alone.   Hx/o family reports hx/o dementia. Hx/o 13 children and prefers they do not know of his admission d/t they would all be at the bedside  and prefers to rest.   States significant other/girlfriend/ Samuel Thomas who lives upstairs in same apt building and Sara's DTR/Samuel Thomas who patient identified as Product manager and POA and also lives in same building. ETOH:  States hx/o severe ETOH use from age 34 up until about 20 years ago but none since.  Patient made and sold white liquor. Home DME:  Cane, Cpap and NO home O2 PT RECS:  HH PT PCP:  Dr. Tery Sanfilippo   -  [facility] [address] [place], [place] [address] [phone] Last appt 1 week prior to this admission and q 3 months. Sleep Apnea managed by MD in [facility]. Dispo Plan:  Home with HHS:  RN, PT  ([facility] notified)

## 2013-12-06 NOTE — Progress Notes (Signed)
Patient was already on CPAP when RT entered room on auto titrate of 4cmH2O minimum and 14cmH2O maximum. Patient is tolerating just fine. RT will continue to monitor.   12/06/13 2014  BiPAP/CPAP/SIPAP  BiPAP/CPAP/SIPAP Pt Type Adult  Mask Type Nasal mask  Mask Size Large  Flow Rate 2 lpm  BiPAP/CPAP/SIPAP CPAP  Patient Home Equipment No  Auto Titrate Yes (auto titrate 4cmH2O minimum and 14cmH2O maximum)  BiPAP/CPAP /SiPAP Vitals  Pulse Rate 87  Resp 17  SpO2 98 %  Bilateral Breath Sounds Clear;Diminished

## 2013-12-06 NOTE — Progress Notes (Signed)
PROGRESS NOTE    Samuel Thomas ZOX:[id number] DOB: Mar 29, 1936 DOA: 11/30/2013 PCP: No primary provider on file.  HPI/Brief narrative 78 year old male with history of HTN, DM 2, HLD, COPD, not on home oxygen, OSA on nightly CPAP, morbid obesity, was brought to the emergency department on 12/01/13 secondary to confusion. He is from [facility] and was found outside his car in [place], confused and had urinated on himself. Bystanders summoned EMS. Febrile illness felt to be secondary to heat exposure. Infection work up negative. Afebrile and has been off antibiotics for days. Acute encephalopathy likely secondary to fever, has resolved. NSVT attributed to hypokalemia, albuterol and OSA-improved. Currently on IV Lasix for acute on chronic diastolic CHF and still significantly volume overloaded. Cardiology following. Also with recent fall at home with mild rhabdomyolysis-resolved.   Assessment/Plan:  1. Acute encephalopathy: Likely related to febrile illness, dehydration and heat exposure, hypoxia. No focal deficits. CT head negative. Resolved. ? History of dementia per family/friends report.  2. NSVT: Likely secondary to bronchodilator nebulizations in the context of hypokalemia.Keep potassium >4 and magnesium >2. 2-D echo-poor study but appears to have normal EF. TSH: Normal. Avoid beta blockers secondary to ?asthma/COPD. No NSVT in last 48 hours. Continue to monitor   3. Acute on chronic diastolic CHF: Cardiology following. -diuresed with Iv lasix, 5.3L negative, euvolemic now, change to PO, ECHO 8/15 with normal EF  4. Febrile illness/SIRS: Unclear etiology     -? Secondary to heat exposure vs Viral illness.     -was briefly on empiric broad-spectrum IV vancomycin and Zosyn. Blood cultures x2-negative to date, now off Abx and stable  5. OSA on nightly CPAP/? OHS: Not on home oxygen. Stable. Continue CPAP.  6. Hypertension: Controlled continue nifedipine and ARB.  7. Uncontrolled type II DM:  Continue Lantus and SSI. Hold metformin. A1c: 6.8. Better controlled.  8. Hypokalemia/hyperkalemia: Replace as needed and keep >4  9. BPH: Continue Flomax.   10. Mild anemia and thrombocytopenia:? Chronic thrombocytopenia, monitor   Code Status: Full Family Communication: Dr.Hongalgi Discussed with Ms. Margot Janajar/HCPOA on 8/23, none at bedside today Disposition Plan: Home tomorrow with [facility]  Consultants:  Cardiology  Procedures:  None  Antibiotics:  IV Zosyn 8/21 > 8/23  IV vancomycin 8/21 > 8/22  Subjective: Doing better, breathing improving  Objective: Filed Vitals:   12/05/13 2036 12/05/13 2227 12/06/13 0631 12/06/13 1150  BP: 141/59  135/66   Pulse: 96 98 73 95  Temp: 98.3 F (36.8 C)  97.9 F (36.6 C)   TempSrc: Oral  Oral   Resp: 18 16 17    Height:      Weight:   106.5 kg (234 lb 12.6 oz)   SpO2: 92% 92% 98% 97%    Intake/Output Summary (Last 24 hours) at 12/06/13 1346 Last data filed at 12/06/13 1329  Gross per 24 hour  Intake    350 ml  Output   2910 ml  Net  -2560 ml   Filed Weights   12/04/13 0528 12/05/13 0655 12/06/13 0631  Weight: 110.904 kg (244 lb 8 oz) 109.045 kg (240 lb 6.4 oz) 106.5 kg (234 lb 12.6 oz)     Exam:  General exam:  moderately built and morbidly obese elderly sitting up comfortably in chair. Respiratory system: Few basal crackles Cardiovascular system: S1 & S2 heard, RRR. No JVD, murmurs, gallops, clicks Gastrointestinal system: Abdomen is nondistended, soft and nontender. Normal bowel sounds heard. Central nervous system: Alert and oriented x4 . No focal neurological  deficits. Extremities: Symmetric 5 x 5 power. Musculoskeletal: No spinal tenderness or deformity    Data Reviewed: Basic Metabolic Panel:  Recent Labs Lab 11/30/13 2107 12/01/13 0051  12/02/13 0614 12/03/13 0357 12/04/13 0339 12/05/13 0409 12/06/13 0344  NA 140  --   < > 141 146 142 141 142  K 2.8*  --   < > 3.8 3.4* 3.6* 3.7 5.0  CL 100   --   < > 103 106 102 101 101  CO2 28  --   < > GLUCOSE 149*  --   < > 175* 163* 171* 122* 162*  BUN 14  --   < > CREATININE 1.04  --   < > 0.85 0.92 0.76 0.88 1.17  CALCIUM 9.0  --   < > 8.5 8.6 8.4 9.3 9.4  MG  --  2.1  --  2.4  --   --   --   --   < > = values in this interval not displayed. Liver Function Tests:  Recent Labs Lab 11/30/13 2107  AST 42*  ALT 18  ALKPHOS 83  BILITOT 0.6  PROT 6.8  ALBUMIN 3.6   No results found for this basename: LIPASE, AMYLASE,  in the last 168 hours  Recent Labs Lab 11/30/13 2206  AMMONIA 33   CBC:  Recent Labs Lab 11/30/13 2107 12/01/13 0226 12/01/13 0415 12/02/13 0614 12/06/13 0344  WBC 13.3*  --  12.0* 7.9 5.7  NEUTROABS 12.1*  --   --   --   --   HGB 13.1 12.9* 12.2* 11.9* 11.8*  HCT 40.1 38.0* 36.6* 35.7* 36.3*  MCV 93.0  --  91.0 90.8 91.7  PLT 123*  --  124* PLATELET CLUMPS NOTED ON SMEAR, COUNT APPEARS ADEQUATE 132*   Cardiac Enzymes:  Recent Labs Lab 11/30/13 2206 12/01/13 0132 12/01/13 0222 12/01/13 0724 12/01/13 1314 12/01/13 1836 12/01/13 2250 12/02/13 0614 12/05/13 0409  CKTOTAL 1384*  --  1163*  --   --  623*  --  327* 65  CKMB  --   --  3.2  --   --   --   --   --   --   TROPONINI  --  <0.30  --  <0.30 <0.30  --  <0.30  --   --    BNP (last 3 results)  Recent Labs  12/03/13 0357 12/04/13 0339  PROBNP 2581.0* 1821.0*   CBG:  Recent Labs Lab 12/05/13 1035 12/05/13 1638 12/05/13 2137 12/06/13 0641 12/06/13 1130  GLUCAP 174* 170* 187* 138* 226*    Recent Results (from the past 240 hour(s))  CULTURE, BLOOD (ROUTINE X 2)     Status: None   Collection Time    11/30/13 10:06 PM      Result Value Ref Range Status   Specimen Description BLOOD RIGHT ARM   Final   Special Requests BOTTLES DRAWN AEROBIC AND ANAEROBIC 4CC   Final   Culture  Setup Time     Final   Value: 12/01/2013 05:04     Performed at [facility]   Culture     Final   Value:         BLOOD CULTURE RECEIVED NO GROWTH TO DATE CULTURE WILL BE HELD FOR 5 DAYS BEFORE ISSUING A FINAL NEGATIVE REPORT     Performed at [facility]   Report Status PENDING   Incomplete  CULTURE,  BLOOD (ROUTINE X 2)     Status: None   Collection Time    11/30/13 10:15 PM      Result Value Ref Range Status   Specimen Description BLOOD RIGHT HAND   Final   Special Requests BOTTLES DRAWN AEROBIC AND ANAEROBIC [facility]   Final   Culture  Setup Time     Final   Value: 12/01/2013 05:04     Performed at [facility]   Culture     Final   Value:        BLOOD CULTURE RECEIVED NO GROWTH TO DATE CULTURE WILL BE HELD FOR 5 DAYS BEFORE ISSUING A FINAL NEGATIVE REPORT     Performed at [facility]   Report Status PENDING   Incomplete  MRSA PCR SCREENING     Status: None   Collection Time    12/01/13 10:39 AM      Result Value Ref Range Status   MRSA by PCR NEGATIVE  NEGATIVE Final   Comment:            The GeneXpert MRSA Assay (FDA     approved for NASAL specimens     only), is one component of a     comprehensive MRSA colonization     surveillance program. It is not     intended to diagnose MRSA     infection nor to guide or     monitor treatment for     MRSA infections.  URINE CULTURE     Status: None   Collection Time    12/01/13  8:20 PM      Result Value Ref Range Status   Specimen Description URINE, CLEAN CATCH   Final   Special Requests NONE   Final   Culture  Setup Time     Final   Value: 12/01/2013 21:37     Performed at [facility] Count     Final   Value: NO GROWTH     Performed at [facility]   Culture     Final   Value: NO GROWTH     Performed at [facility]   Report Status 12/03/2013 FINAL   Final         Studies: No results found.      Scheduled Meds: . atorvastatin  20 mg Oral QHS  . baclofen  10 mg Oral Daily  . FLUoxetine  20 mg Oral Daily  . fluticasone  1-2 spray Each Nare Daily  . fluticasone  1  puff Inhalation BID  . furosemide  80 mg Oral Daily  . gabapentin  600 mg Oral TID  . heparin  5,000 Units Subcutaneous 3 times per day  . insulin aspart  0-15 Units Subcutaneous TID WC  . insulin aspart  0-5 Units Subcutaneous QHS  . insulin aspart  5 Units Subcutaneous TID WC  . insulin glargine  25 Units Subcutaneous BID  . loratadine  10 mg Oral Daily  . losartan  100 mg Oral Daily  . NIFEdipine  90 mg Oral Daily  . potassium chloride  40 mEq Oral Daily  . sodium chloride  3 mL Intravenous Q12H  . spironolactone  25 mg Oral Daily  . tamsulosin  0.4 mg Oral Daily   Continuous Infusions:    Active Problems:   NSVT (nonsustained ventricular tachycardia)   SIRS (systemic inflammatory response syndrome)   Altered mental status   Hypokalemia   DM2 (diabetes mellitus,  type 2)   HTN (hypertension)   Hypoxia   Acute diastolic heart failure    Time spent: 25 minutes.    Zannie Cove, MD [facility] Pager [phone]  If 7PM-7AM, please contact night-coverage [email or web address] Password [email or web address] 12/06/2013, 1:46 PM    LOS: 6 days

## 2013-12-06 NOTE — Progress Notes (Signed)
Physical Therapy Treatment Patient Details Name: Samuel Thomas MRN: [id number] DOB: 1935/12/01 Today's Date: 12/06/2013    History of Present Illness 78 year old male with history of HTN, DM 2, HLD, COPD not on home oxygen, OSA on nightly CPAP, morbid obesity, was brought to the ED on 12/01/13 secondary to confusion. He is from [facility] and was found outside his car in [place], confused and had urinated on himself. Bystanders summoned EMS. Mental status in the ED had improved slightly. He was noted to have fever but chest x-ray was negative. He was noted to have several runs of NSVT in the context of hypokalemia and nebulizations. Cardiology was consulted. Patient states that he fell off his bed on 8/19 and hurt his back and laid on the floor for a few hours. Next morning he was able to crawl to the bathroom and take a shower with improvement in his back pain. Yesterday he went out shopping for clothes in preparation for his daughter's wedding in a month and states that he might have been out in the hot sun.    PT Comments    Able to progress activity tolerance and engage in conversation (even sing a bit) during walk; Recommend pt walk hallways again with staff or family  Follow Up Recommendations  Home health PT;Supervision for mobility/OOB     Equipment Recommendations  None recommended by PT    Recommendations for Other Services       Precautions / Restrictions Precautions Precautions: Fall Restrictions Weight Bearing Restrictions: No    Mobility  Bed Mobility Overal bed mobility: Needs Assistance Bed Mobility: Supine to Sit     Supine to sit: Supervision     General bed mobility comments: VC's for sequencing and technique. Pt requires increased time to complete transfer to EOB. Guarding only, but no real physical assist. Used bed rails  Transfers Overall transfer level: Needs assistance Equipment used: None (and occasional pushing Dinamap) Transfers: Sit to/from  Stand Sit to Stand: Supervision         General transfer comment: VC's for hand placement on seated surface for safety. Pt was able to power-up to full standing position without physical assist. No unsteadiness noted.   Ambulation/Gait Ambulation/Gait assistance: Min guard;Supervision Ambulation Distance (Feet): 200 Feet Assistive device: None (intermittently held onto dynamap/BP machine) Gait Pattern/deviations: Step-through pattern;Decreased stride length Gait velocity: Decreased   General Gait Details: Cues to self-monitor for activity tolerance   Stairs            Wheelchair Mobility    Modified Rankin (Stroke Patients Only)       Balance Overall balance assessment: Needs assistance   Sitting balance-Leahy Scale: Good       Standing balance-Leahy Scale: Fair                      Cognition Arousal/Alertness: Awake/alert Behavior During Therapy: WFL for tasks assessed/performed Overall Cognitive Status: Within Functional Limits for tasks assessed                      Exercises      General Comments        Pertinent Vitals/Pain Pain Assessment: No/denies pain vss    Home Living                      Prior Function            PT Goals (current goals can now be found in the care  plan section) Acute Rehab PT Goals Patient Stated Goal: To return home with girlfriend and friend PT Goal Formulation: With patient Time For Goal Achievement: 12/09/13 Potential to Achieve Goals: Good Progress towards PT goals: Progressing toward goals    Frequency  Min 3X/week    PT Plan Current plan remains appropriate    Co-evaluation             End of Session Equipment Utilized During Treatment: Gait belt Activity Tolerance: Patient tolerated treatment well Patient left: in chair;with call bell/phone within reach;Other (comment) (readying to wash up)     Time: 1610-9604 PT Time Calculation (min): 23 min  Charges:  $Gait  Training: 23-37 mins                    G Codes:      Van Clines Hamff 12/06/2013, 11:19 AM  Van Clines, PT  Acute Rehabilitation Services Pager [phone] Office [phone]

## 2013-12-06 NOTE — Progress Notes (Signed)
SUBJECTIVE:  Breathing better.  No acute distress   PHYSICAL EXAM Filed Vitals:   12/05/13 2021 12/05/13 2036 12/05/13 2227 12/06/13 0631  BP:  141/59  135/66  Pulse:  96 98 73  Temp:  98.3 F (36.8 C)  97.9 F (36.6 C)  TempSrc:  Oral  Oral  Resp:  Height:      Weight:    234 lb 12.6 oz (106.5 kg)  SpO2: 93% 92% 92% 98%   General:  No acute distress Lungs:  Decreased breath sounds Heart:  RRR Abdomen:  Positive bowel sounds, no rebound no guarding Extremities:  Mild edema bilateral legs.   LABS:  Results for orders placed during the hospital encounter of 11/30/13 (from the past 24 hour(s))  GLUCOSE, CAPILLARY     Status: Abnormal   Collection Time    12/05/13 10:35 AM      Result Value Ref Range   Glucose-Capillary 174 (*) 70 - 99 mg/dL   Comment 1 Notify RN    GLUCOSE, CAPILLARY     Status: Abnormal   Collection Time    12/05/13  4:38 PM      Result Value Ref Range   Glucose-Capillary 170 (*) 70 - 99 mg/dL  GLUCOSE, CAPILLARY     Status: Abnormal   Collection Time    12/05/13  9:37 PM      Result Value Ref Range   Glucose-Capillary 187 (*) 70 - 99 mg/dL   Comment 1 Documented in Chart     Comment 2 Notify RN    BASIC METABOLIC PANEL     Status: Abnormal   Collection Time    12/06/13  3:44 AM      Result Value Ref Range   Sodium 142  137 - 147 mEq/L   Potassium 5.0  3.7 - 5.3 mEq/L   Chloride 101  96 - 112 mEq/L   CO2 29  19 - 32 mEq/L   Glucose, Bld 162 (*) 70 - 99 mg/dL   BUN 19  6 - 23 mg/dL   Creatinine, Ser 4.09  0.50 - 1.35 mg/dL   Calcium 9.4  8.4 - 81.1 mg/dL   GFR calc non Af Amer 59 (*) >90 mL/min   GFR calc Af Amer 69 (*) >90 mL/min   Anion gap 12  5 - 15  CBC     Status: Abnormal   Collection Time    12/06/13  3:44 AM      Result Value Ref Range   WBC 5.7  4.0 - 10.5 K/uL   RBC 3.96 (*) 4.22 - 5.81 MIL/uL   Hemoglobin 11.8 (*) 13.0 - 17.0 g/dL   HCT 91.4 (*) 78.2 - 95.6 %   MCV 91.7  78.0 - 100.0 fL   MCH 29.8  26.0 -  34.0 pg   MCHC 32.5  30.0 - 36.0 g/dL   RDW 21.3  08.6 - 57.8 %   Platelets 132 (*) 150 - 400 K/uL  GLUCOSE, CAPILLARY     Status: Abnormal   Collection Time    12/06/13  6:41 AM      Result Value Ref Range   Glucose-Capillary 138 (*) 70 - 99 mg/dL    Intake/Output Summary (Last 24 hours) at 12/06/13 0830 Last data filed at 12/06/13 4696  Gross per 24 hour  Intake    223 ml  Output   2760 ml  Net  -2537 ml    ASSESSMENT AND PLAN:  NSVT:  PVCs no further events.    DIASTOLIC HF:     Good UO yesterday and he feels better.   I suspect that we would be OK to change to PO bid Lasix today.  I reduced the KDur slightly.    Samuel Thomas 12/06/2013 8:30 AM

## 2013-12-07 DIAGNOSIS — E1139 Type 2 diabetes mellitus with other diabetic ophthalmic complication: Secondary | ICD-10-CM

## 2013-12-07 DIAGNOSIS — R509 Fever, unspecified: Secondary | ICD-10-CM

## 2013-12-07 LAB — BASIC METABOLIC PANEL
Anion gap: 13 (ref 5–15)
BUN: 19 mg/dL (ref 6–23)
CO2: 28 mEq/L (ref 19–32)
Calcium: 9.4 mg/dL (ref 8.4–10.5)
Chloride: 100 mEq/L (ref 96–112)
Creatinine, Ser: 0.84 mg/dL (ref 0.50–1.35)
GFR calc non Af Amer: 84 mL/min — ABNORMAL LOW (ref >90)
GLUCOSE: 120 mg/dL — AB (ref 70–99)
POTASSIUM: 4.6 meq/L (ref 3.7–5.3)
Sodium: 141 mEq/L (ref 137–147)

## 2013-12-07 LAB — CULTURE, BLOOD (ROUTINE X 2)
CULTURE: NO GROWTH
CULTURE: NO GROWTH

## 2013-12-07 LAB — GLUCOSE, CAPILLARY
GLUCOSE-CAPILLARY: 278 mg/dL — AB (ref 70–99)
GLUCOSE-CAPILLARY: 272 mg/dL — AB (ref 70–99)
Glucose-Capillary: 129 mg/dL — ABNORMAL HIGH (ref 70–99)

## 2013-12-07 MED ORDER — FUROSEMIDE 40 MG PO TABS
40.0000 mg | ORAL_TABLET | Freq: Every day | ORAL | Status: AC
Start: 2013-12-07 — End: ?

## 2013-12-07 MED ORDER — POTASSIUM CHLORIDE ER 20 MEQ PO TBCR: 20.0000 meq | EXTENDED_RELEASE_TABLET | Freq: Every day | ORAL | Status: DC

## 2013-12-07 NOTE — Progress Notes (Signed)
Discharge instructions given. Pt verbalized understanding and all questions were answered.  

## 2013-12-07 NOTE — Discharge Summary (Signed)
Physician Discharge Summary  Samuel Thomas ZOX:[id number] DOB: 02-07-1936 DOA: 11/30/2013  PCP: No primary provider on file.  Admit date: 11/30/2013 Discharge date: 12/07/2013  Time spent: 35 minutes  Recommendations for Outpatient Follow-up:  1.  PCP Dr.Selvidge on 9/3 at 10:40am 2.  Please check Bmet in 1 week 3.  Dr.Traci Turner office will call   Discharge Diagnoses:    Acute Diastolic CHF   NSVT (nonsustained ventricular tachycardia)   SIRS (systemic inflammatory response syndrome)   Altered mental status   Hypokalemia   DM2 (diabetes mellitus, type 2)   HTN (hypertension)   Fever   Metabolic encephalopathy  Discharge Condition: stable  Diet recommendation: low sodium, diabetic  Filed Weights   12/05/13 0655 12/06/13 0631 12/07/13 0422  Weight: 109.045 kg (240 lb 6.4 oz) 106.5 kg (234 lb 12.6 oz) 104.781 kg (231 lb)    History of present illness:  Samuel Thomas is a 78 y.o. male who is brought in to the ED with confusion. He is from [place] but was found outside of his car in [place], confused, and had urinated on himself. Bystanders summoned EMS. Patient was confused and weak initially, and does not know why he is in [place]. It is unknown how long he was outside in the hot weather outside of his car today. He was brought to the ED.  In the ED his mental status has slightly improved, but he continues to have some SOB. Fever has improved without specific treatment, UA was negative, CXR shows pulmonary vascular congestion.  He was noted to be wheezing so was given albuterol, he was also started on potassium replacement for his hypokalemia.  While in the ER the patient has continually gone into progressively longer runs of NSVT, now up to 14 beats.    Hospital Course:   1. Acute encephalopathy: Likely related to febrile illness, likely viral dehydration and heat exposure,  No focal deficits. CT head negative. Resolved. ? History of dementia per family/friends  report.  2.    NSVT: Likely secondary to bronchodilator nebulizations in the context of hypokalemia and CHF-resolved  3. Acute on chronic diastolic CHF: Cardiology following. -diuresed with Iv lasix, 5.3L negative, euvolemic now, changed to PO, ECHO 8/15 with normal EF       -discharged home on Po lasix and KCL       -FU with PCP, cards and Bmet in 1 week   4. Febrile illness/SIRS: Unclear etiology             -? Secondary to heat exposure vs Viral illness.              -was briefly on empiric broad-spectrum IV vancomycin and Zosyn. Blood cultures x2-negative to date, now off Abx and stable   5. OSA on nightly CPAP/? OHS: Not on home oxygen. Stable. Continue CPAP  6. Hypertension: Controlled continue nifedipine and ARB.  7. Uncontrolled type II DM: Continue Lantus and SSI. Hold metformin. A1c: 6.8. Better controlled.  8. Hypokalemia/hyperkalemia: Replace as needed and keep >4  9. BPH: Continue Flomax.   10. Mild anemia and thrombocytopenia:? Chronic thrombocytopenia, monitor     Consultations:  Cards  Discharge Exam: Filed Vitals:   12/07/13 0422  BP: 136/59  Pulse: 83  Temp: 98 F (36.7 C)  Resp: 20    General: AAOx3 Cardiovascular: S1S2/RRR Respiratory: CTAB  Discharge Instructions You were cared for by a hospitalist during your hospital stay. If you have any questions about your discharge medications or  the care you received while you were in the hospital after you are discharged, you can call the unit and asked to speak with the hospitalist on call if the hospitalist that took care of you is not available. Once you are discharged, your primary care physician will handle any further medical issues. Please note that NO REFILLS for any discharge medications will be authorized once you are discharged, as it is imperative that you return to your primary care physician (or establish a relationship with a primary care physician if you do not have one) for your aftercare  needs so that they can reassess your need for medications and monitor your lab values.  Discharge Instructions   Diet - low sodium heart healthy    Complete by:  As directed      Diet Carb Modified    Complete by:  As directed      Increase activity slowly    Complete by:  As directed             Medication List    STOP taking these medications       hydrochlorothiazide 25 MG tablet  Commonly known as:  HYDRODIURIL      TAKE these medications       atorvastatin 20 MG tablet  Commonly known as:  LIPITOR  Take 20 mg by mouth at bedtime.     baclofen 10 MG tablet  Commonly known as:  LIORESAL  Take 10 mg by mouth as directed.     beclomethasone 40 MCG/ACT inhaler  Commonly known as:  QVAR  Inhale 1-2 puffs into the lungs 2 (two) times daily.     FLUoxetine 20 MG capsule  Commonly known as:  PROZAC  Take 20 mg by mouth daily.     fluticasone 50 MCG/ACT nasal spray  Commonly known as:  FLONASE  Place 1-2 sprays into both nostrils daily.     furosemide 40 MG tablet  Commonly known as:  LASIX  Take 1 tablet (40 mg total) by mouth daily.     gabapentin 600 MG tablet  Commonly known as:  NEURONTIN  Take 600 mg by mouth 3 (three) times daily.     HYDROcodone-acetaminophen 5-325 MG per tablet  Commonly known as:  NORCO/VICODIN  Take 1 tablet by mouth every 6 (six) hours as needed for moderate pain.     insulin aspart 100 UNIT/ML injection  Commonly known as:  novoLOG  Inject 4 Units into the skin as directed.     insulin glargine 100 UNIT/ML injection  Commonly known as:  LANTUS  Inject 25-30 Units into the skin 2 (two) times daily.     levalbuterol 45 MCG/ACT inhaler  Commonly known as:  XOPENEX HFA  Inhale into the lungs every 4 (four) hours as needed for wheezing.     loratadine 10 MG tablet  Commonly known as:  CLARITIN  Take 10 mg by mouth daily.     losartan 100 MG tablet  Commonly known as:  COZAAR  Take 100 mg by mouth daily.     metFORMIN 500  MG 24 hr tablet  Commonly known as:  GLUCOPHAGE-XR  Take 1,000 mg by mouth 2 (two) times daily.     NIFEdipine 90 MG 24 hr tablet  Commonly known as:  PROCARDIA XL/ADALAT-CC  Take 90 mg by mouth daily.     Potassium Chloride ER 20 MEQ Tbcr  Take 20 mEq by mouth daily.     tamsulosin 0.4 MG Caps  capsule  Commonly known as:  FLOMAX  Take 0.4 mg by mouth daily.       Allergies  Allergen Reactions  . Penicillins     Break outs swelling       Follow-up Information   Schedule an appointment as soon as possible for a visit in 1 week to follow up.      Follow up with Arlyss Queen, MD On 12/14/2013. ( :40 am spoke with Byrd Hesselbach )    Specialty:  Family Medicine   Contact information:   [address] [place] [address] [phone]       Follow up with Quintella Reichert, MD. (office will call you )    Specialty:  Cardiology   Contact information:   [address] [place] [address] [phone]       Follow up with [facility]. (Registered Nurse and Physical Therapy Services to start within 24-48 hours of discharge)    Contact information:   [address] [place] [address] [phone]        The results of significant diagnostics from this hospitalization (including imaging, microbiology, ancillary and laboratory) are listed below for reference.    Significant Diagnostic Studies: Dg Chest 2 View  12/01/2013   CLINICAL DATA:  Altered mental status, arrhythmia.  EXAM: CHEST  2 VIEW  COMPARISON:  Chest radiograph November 30, 2013 at 2141 hr  FINDINGS: Cardiac silhouette remains mild to moderately enlarged mildly calcified aortic knob. Mild central pulmonary vascular congestion in this decreased inspiratory examination. Strandy densities left lung base. No pleural effusions or focal consolidations. No pneumothorax.  Osteopenia.  ACDF.  Mild degenerative change of thoracic spine.  IMPRESSION: Stable  cardiomegaly, mild central pulmonary vascular congestion with left lung base atelectasis versus scarring.   Electronically Signed   By: Awilda Metro   On: 12/01/2013 01:40   Ct Head Wo Contrast  12/01/2013   CLINICAL DATA:  Confusion.  Altered mental status.  EXAM: CT HEAD WITHOUT CONTRAST  TECHNIQUE: Contiguous axial images were obtained from the base of the skull through the vertex without intravenous contrast.  COMPARISON:  None.  FINDINGS: Stable age related cerebral atrophy, ventriculomegaly and periventricular white matter disease. No extra-axial fluid collections are identified. No CT findings for acute hemispheric infarction or intracranial hemorrhage. No mass lesions. The brainstem and cerebellum are normal.  No acute bony findings. Scattered ethmoid sinus disease. The globes are intact.  IMPRESSION: Age related cerebral atrophy, ventriculomegaly and periventricular white matter disease.  No acute intracranial findings or mass lesions.   Electronically Signed   By: Loralie Champagne M.D.   On: 12/01/2013 00:30   Dg Chest Port 1 View  12/02/2013   CLINICAL DATA:  Shortness of breath  EXAM: PORTABLE CHEST - 1 VIEW  COMPARISON:  12/01/2013  FINDINGS: Stable mild cardiomegaly with central vascular congestion. Minor basilar atelectasis/ scarring. No new consolidation, collapse or focal pneumonia. No effusion or pneumothorax. Trachea midline. Prior cervical fusion hardware noted.  IMPRESSION: Stable cardiomegaly with vascular congestion and basilar atelectasis.   Electronically Signed   By: Ruel Favors M.D.   On: 12/02/2013 09:05   Dg Chest Portable 1 View  11/30/2013   CLINICAL DATA:  Confusion and weakness.  EXAM: PORTABLE CHEST - 1 VIEW  COMPARISON:  None.  FINDINGS: Heart size is upper limits of normal. Lungs are clear but difficult to evaluate the left lung base. Atherosclerotic calcifications at the aortic arch. Surgical  plate in the cervical spine.  IMPRESSION: No acute chest abnormality but  limited evaluation of the left lung base.   Electronically Signed   By: Richarda Overlie M.D.   On: 11/30/2013 22:20    Microbiology: Recent Results (from the past 240 hour(s))  CULTURE, BLOOD (ROUTINE X 2)     Status: None   Collection Time    11/30/13 10:06 PM      Result Value Ref Range Status   Specimen Description BLOOD RIGHT ARM   Final   Special Requests BOTTLES DRAWN AEROBIC AND ANAEROBIC 4CC   Final   Culture  Setup Time     Final   Value: 12/01/2013 05:04     Performed at [facility]   Culture     Final   Value: NO GROWTH 5 DAYS     Performed at [facility]   Report Status 12/07/2013 FINAL   Final  CULTURE, BLOOD (ROUTINE X 2)     Status: None   Collection Time    11/30/13 10:15 PM      Result Value Ref Range Status   Specimen Description BLOOD RIGHT HAND   Final   Special Requests BOTTLES DRAWN AEROBIC AND ANAEROBIC [facility]   Final   Culture  Setup Time     Final   Value: 12/01/2013 05:04     Performed at [facility]   Culture     Final   Value: NO GROWTH 5 DAYS     Performed at [facility]   Report Status 12/07/2013 FINAL   Final  MRSA PCR SCREENING     Status: None   Collection Time    12/01/13 10:39 AM      Result Value Ref Range Status   MRSA by PCR NEGATIVE  NEGATIVE Final   Comment:            The GeneXpert MRSA Assay (FDA     approved for NASAL specimens     only), is one component of a     comprehensive MRSA colonization     surveillance program. It is not     intended to diagnose MRSA     infection nor to guide or     monitor treatment for     MRSA infections.  URINE CULTURE     Status: None   Collection Time    12/01/13  8:20 PM      Result Value Ref Range Status   Specimen Description URINE, CLEAN CATCH   Final   Special Requests NONE   Final   Culture  Setup Time     Final   Value: 12/01/2013 21:37     Performed at [facility]   [place] Count     Final   Value: NO GROWTH     Performed at [facility]   Culture     Final   Value: NO GROWTH     Performed at [facility]   Report Status 12/03/2013 FINAL   Final     Labs: Basic Metabolic Panel:  Recent Labs Lab 11/30/13 2107 12/01/13 0051  12/02/13 1610 12/03/13 0357 12/04/13 0339 12/05/13 0409 12/06/13 0344 12/07/13 0747  NA 140  --   < > 141 146 142 141 142 141  K 2.8*  --   < > 3.8 3.4* 3.6* 3.7 5.0 4.6  CL 100  --   < > 103 106 102 101 101 100  CO2 28  --   < >  GLUCOSE 149*  --   < > 175* 163* 171* 122* 162* 120*  BUN 14  --   < > CREATININE 1.04  --   < > 0.85 0.92 0.76 0.88 1.17 0.84  CALCIUM 9.0  --   < > 8.5 8.6 8.4 9.3 9.4 9.4  MG  --  2.1  --  2.4  --   --   --   --   --   < > = values in this interval not displayed. Liver Function Tests:  Recent Labs Lab 11/30/13 2107  AST 42*  ALT 18  ALKPHOS 83  BILITOT 0.6  PROT 6.8  ALBUMIN 3.6   No results found for this basename: LIPASE, AMYLASE,  in the last 168 hours  Recent Labs Lab 11/30/13 2206  AMMONIA 33   CBC:  Recent Labs Lab 11/30/13 2107 12/01/13 0226 12/01/13 0415 12/02/13 0614 12/06/13 0344  WBC 13.3*  --  12.0* 7.9 5.7  NEUTROABS 12.1*  --   --   --   --   HGB 13.1 12.9* 12.2* 11.9* 11.8*  HCT 40.1 38.0* 36.6* 35.7* 36.3*  MCV 93.0  --  91.0 90.8 91.7  PLT 123*  --  124* PLATELET CLUMPS NOTED ON SMEAR, COUNT APPEARS ADEQUATE 132*   Cardiac Enzymes:  Recent Labs Lab 11/30/13 2206 12/01/13 0132 12/01/13 0222 12/01/13 0724 12/01/13 1314 12/01/13 1836 12/01/13 2250 12/02/13 0614 12/05/13 0409  CKTOTAL 1384*  --  1163*  --   --  623*  --  327* 65  CKMB  --   --  3.2  --   --   --   --   --   --   TROPONINI  --  <0.30  --  <0.30 <0.30  --  <0.30  --   --    BNP: BNP (last 3 results)  Recent Labs  12/03/13 0357 12/04/13 0339  PROBNP 2581.0* 1821.0*   CBG:  Recent Labs Lab 12/06/13 1130 12/06/13 1713 12/06/13 2109 12/07/13 0641 12/07/13 1044  GLUCAP 226*  213* 168* 129* 278*       Signed:  Lynniah Janoski  [facility] 12/07/2013, 12:47 PM

## 2013-12-07 NOTE — Progress Notes (Signed)
    SUBJECTIVE:  Breathing better.  No acute distress   PHYSICAL EXAM Filed Vitals:   12/06/13 1350 12/06/13 2014 12/06/13 2045 12/07/13 0422  BP: 137/64  138/57 136/59  Pulse: 84 87 81 83  Temp: 98.5 F (36.9 C)  98.6 F (37 C) 98 F (36.7 C)  TempSrc: Oral  Oral Oral  Resp:  Height:      Weight:    231 lb (104.781 kg)  SpO2: 98% 98% 95% 96%   General:  No acute distress Lungs:  Decreased breath sounds Heart:  RRR Abdomen:  Positive bowel sounds, no rebound no guarding Extremities:  Trace edema bilateral legs.   LABS:  Results for orders placed during the hospital encounter of 11/30/13 (from the past 24 hour(s))  GLUCOSE, CAPILLARY     Status: Abnormal   Collection Time    12/06/13 11:30 AM      Result Value Ref Range   Glucose-Capillary 226 (*) 70 - 99 mg/dL   Comment 1 Documented in Chart     Comment 2 Notify RN    GLUCOSE, CAPILLARY     Status: Abnormal   Collection Time    12/06/13  5:13 PM      Result Value Ref Range   Glucose-Capillary 213 (*) 70 - 99 mg/dL   Comment 1 Documented in Chart     Comment 2 Notify RN    GLUCOSE, CAPILLARY     Status: Abnormal   Collection Time    12/06/13  9:09 PM      Result Value Ref Range   Glucose-Capillary 168 (*) 70 - 99 mg/dL  GLUCOSE, CAPILLARY     Status: Abnormal   Collection Time    12/07/13  6:41 AM      Result Value Ref Range   Glucose-Capillary 129 (*) 70 - 99 mg/dL   Comment 1 Notify RN      Intake/Output Summary (Last 24 hours) at 12/07/13 0811 Last data filed at 12/07/13 0807  Gross per 24 hour  Intake    590 ml  Output   2500 ml  Net  -1910 ml    ASSESSMENT AND PLAN:  NSVT:  PVCs no further events.    DIASTOLIC HF:     Awaiting labs.  Changed to PO Lasix yesterday.  OK to discharge pending labs.  He should have close follow up of BMET next week in his primary care office to determine if he is on the correct dose of diuretic and Kdur.  I would send him home with 40 mg of Lasix daily.    He might need 20 meq of Kdur with this.     Fayrene Fearing [facility] 12/07/2013 8:11 AM

## 2013-12-15 ENCOUNTER — Encounter: Payer: Medicare Other | Admitting: Cardiology

## 2013-12-21 ENCOUNTER — Encounter: Payer: Medicare Other | Admitting: Cardiology

## 2014-01-02 DIAGNOSIS — I5042 Chronic combined systolic (congestive) and diastolic (congestive) heart failure: Secondary | ICD-10-CM | POA: Insufficient documentation

## 2014-01-03 ENCOUNTER — Encounter: Payer: Medicare Other | Admitting: Physician Assistant

## 2014-01-16 DIAGNOSIS — I5032 Chronic diastolic (congestive) heart failure: Secondary | ICD-10-CM | POA: Insufficient documentation

## 2014-01-16 DIAGNOSIS — R600 Localized edema: Secondary | ICD-10-CM | POA: Insufficient documentation

## 2014-01-16 DIAGNOSIS — L03119 Cellulitis of unspecified part of limb: Secondary | ICD-10-CM | POA: Insufficient documentation

## 2014-01-30 DIAGNOSIS — M179 Osteoarthritis of knee, unspecified: Secondary | ICD-10-CM | POA: Insufficient documentation

## 2014-01-30 DIAGNOSIS — M1710 Unilateral primary osteoarthritis, unspecified knee: Secondary | ICD-10-CM | POA: Insufficient documentation

## 2014-02-28 ENCOUNTER — Other Ambulatory Visit: Payer: Self-pay | Admitting: *Deleted

## 2014-02-28 ENCOUNTER — Ambulatory Visit (INDEPENDENT_AMBULATORY_CARE_PROVIDER_SITE_OTHER): Payer: Medicare Other | Admitting: Podiatry

## 2014-02-28 ENCOUNTER — Encounter: Payer: Self-pay | Admitting: Podiatry

## 2014-02-28 ENCOUNTER — Ambulatory Visit (INDEPENDENT_AMBULATORY_CARE_PROVIDER_SITE_OTHER): Payer: Medicare Other

## 2014-02-28 VITALS — Ht 67.5 in | Wt 236.0 lb

## 2014-02-28 DIAGNOSIS — M79676 Pain in unspecified toe(s): Secondary | ICD-10-CM

## 2014-02-28 DIAGNOSIS — B351 Tinea unguium: Secondary | ICD-10-CM

## 2014-02-28 DIAGNOSIS — E119 Type 2 diabetes mellitus without complications: Secondary | ICD-10-CM

## 2014-02-28 NOTE — Progress Notes (Signed)
   Subjective:    Patient ID: Samuel Thomas, male    DOB: 02/16/1936, 78 y.o.   MRN: [id number]  HPI Comments: Some bad toenails , they ugly and thick . Been diabetic for 20 years . Last a1c  6.0     Review of Systems  HENT:       Hearing loss   Eyes: Positive for itching.  Respiratory: Positive for shortness of breath and wheezing.   Cardiovascular: Positive for leg swelling.  Endocrine:       Diabetes   Hematological: Bruises/bleeds easily.       Objective:   Physical Exam: I have reviewed his past mental history medications allergies surgery social history and review of systems. Pulses are strongly palpable bilateral. Neurologic sensorium is slightly decreased versus lasting monofilament. Deep tendon reflexes are intact bilateral muscle strength is 5 over 5 dorsiflexion plantar flexors and inverters and everters all into the musculature is intact. Orthopedic evaluation demonstrates all joints symptoms the angle full range of motion without crepitation. Hammertoe deformity and hallux valgus deformities are flexible bilateral. Radiographic evaluation does not demonstrates any osseous abnormalities and no acute fractures. Cutaneous evaluation demonstrates supple well-hydrated cutis toenails are thick yellow dystrophic with mycotic. No signs of infection. No ulcerations.        Assessment & Plan:  Assessment: Diabetes with diabetic peripheral neuropathy mild hammertoe deformities pain in limb secondary to onychomycosis 1 through 5 bilateral.  Plan: Debridement of nails 1 through 5 bilateral covered service secondary to pain.

## 2014-04-03 DIAGNOSIS — K5903 Drug induced constipation: Secondary | ICD-10-CM | POA: Insufficient documentation

## 2014-04-25 DIAGNOSIS — G4733 Obstructive sleep apnea (adult) (pediatric): Secondary | ICD-10-CM | POA: Insufficient documentation

## 2014-05-17 DIAGNOSIS — E119 Type 2 diabetes mellitus without complications: Secondary | ICD-10-CM | POA: Insufficient documentation

## 2014-05-30 ENCOUNTER — Ambulatory Visit (INDEPENDENT_AMBULATORY_CARE_PROVIDER_SITE_OTHER): Payer: Medicare Other | Admitting: Podiatry

## 2014-05-30 ENCOUNTER — Encounter: Payer: Self-pay | Admitting: Podiatry

## 2014-05-30 DIAGNOSIS — E119 Type 2 diabetes mellitus without complications: Secondary | ICD-10-CM

## 2014-05-30 DIAGNOSIS — B351 Tinea unguium: Secondary | ICD-10-CM

## 2014-05-30 DIAGNOSIS — M79676 Pain in unspecified toe(s): Secondary | ICD-10-CM

## 2014-05-30 NOTE — Progress Notes (Signed)
He presents today with a chief complaint of painful elongated toenails 1 through 5 bilateral. ° °Objective: Pulses are palpable bilateral. Nails are thick yellow dystrophic clinically mycotic and painful palpation. ° °Assessment: Anal limb secondary to onychomycosis 1 through 5 bilateral. ° °Plan: Debridement of nails 1 through 5 bilateral is a covered service secondary to pain. °

## 2014-07-03 DIAGNOSIS — G8929 Other chronic pain: Secondary | ICD-10-CM | POA: Insufficient documentation

## 2014-07-03 DIAGNOSIS — M25561 Pain in right knee: Secondary | ICD-10-CM

## 2014-07-24 DIAGNOSIS — L97909 Non-pressure chronic ulcer of unspecified part of unspecified lower leg with unspecified severity: Secondary | ICD-10-CM | POA: Insufficient documentation

## 2014-07-24 DIAGNOSIS — I83009 Varicose veins of unspecified lower extremity with ulcer of unspecified site: Secondary | ICD-10-CM | POA: Insufficient documentation

## 2014-07-24 DIAGNOSIS — M1990 Unspecified osteoarthritis, unspecified site: Secondary | ICD-10-CM | POA: Insufficient documentation

## 2014-08-29 DIAGNOSIS — Z79891 Long term (current) use of opiate analgesic: Secondary | ICD-10-CM

## 2014-08-29 DIAGNOSIS — Z5181 Encounter for therapeutic drug level monitoring: Secondary | ICD-10-CM | POA: Insufficient documentation

## 2014-09-03 ENCOUNTER — Ambulatory Visit (INDEPENDENT_AMBULATORY_CARE_PROVIDER_SITE_OTHER): Payer: Medicare Other | Admitting: Podiatry

## 2014-09-03 DIAGNOSIS — IMO0002 Reserved for concepts with insufficient information to code with codable children: Secondary | ICD-10-CM

## 2014-09-03 DIAGNOSIS — E119 Type 2 diabetes mellitus without complications: Secondary | ICD-10-CM

## 2014-09-03 DIAGNOSIS — B351 Tinea unguium: Secondary | ICD-10-CM | POA: Diagnosis not present

## 2014-09-03 DIAGNOSIS — M79676 Pain in unspecified toe(s): Secondary | ICD-10-CM | POA: Diagnosis not present

## 2014-09-03 DIAGNOSIS — E1165 Type 2 diabetes mellitus with hyperglycemia: Secondary | ICD-10-CM

## 2014-09-03 NOTE — Progress Notes (Signed)
He presents today with a chief complaint of painful elongated toenails 1 through 5 bilateral.  Objective: Pulses are palpable bilateral. Nails are thick yellow dystrophic clinically mycotic and painful palpation.  Assessment: Anal limb secondary to onychomycosis 1 through 5 bilateral.  Plan: Debridement of nails 1 through 5 bilateral is a covered service secondary to pain.

## 2014-10-04 DIAGNOSIS — G894 Chronic pain syndrome: Secondary | ICD-10-CM | POA: Insufficient documentation

## 2014-12-10 ENCOUNTER — Ambulatory Visit (INDEPENDENT_AMBULATORY_CARE_PROVIDER_SITE_OTHER): Payer: Medicare Other | Admitting: Podiatry

## 2014-12-10 DIAGNOSIS — B351 Tinea unguium: Secondary | ICD-10-CM

## 2014-12-10 DIAGNOSIS — M79676 Pain in unspecified toe(s): Secondary | ICD-10-CM | POA: Diagnosis not present

## 2014-12-10 DIAGNOSIS — E119 Type 2 diabetes mellitus without complications: Secondary | ICD-10-CM | POA: Diagnosis not present

## 2014-12-10 NOTE — Progress Notes (Signed)
He presents today with a chief complaint of painful elongated toenails 1 through 5 bilateral.  Objective: Pulses are palpable bilateral. Nails are thick yellow dystrophic clinically mycotic and painful palpation.  Assessment: Anal limb secondary to onychomycosis 1 through 5 bilateral.  Plan: Debridement of nails 1 through 5 bilateral is a covered service secondary to pain. 3 mo.  [facility]

## 2015-01-02 DIAGNOSIS — S37009A Unspecified injury of unspecified kidney, initial encounter: Secondary | ICD-10-CM | POA: Insufficient documentation

## 2015-01-02 DIAGNOSIS — R109 Unspecified abdominal pain: Secondary | ICD-10-CM | POA: Insufficient documentation

## 2015-01-02 DIAGNOSIS — N179 Acute kidney failure, unspecified: Secondary | ICD-10-CM | POA: Insufficient documentation

## 2015-01-02 DIAGNOSIS — R112 Nausea with vomiting, unspecified: Secondary | ICD-10-CM | POA: Insufficient documentation

## 2015-01-03 DIAGNOSIS — K5732 Diverticulitis of large intestine without perforation or abscess without bleeding: Secondary | ICD-10-CM | POA: Insufficient documentation

## 2015-03-11 ENCOUNTER — Ambulatory Visit: Payer: Medicare Other

## 2015-03-28 ENCOUNTER — Ambulatory Visit: Payer: Medicare Other

## 2015-03-29 ENCOUNTER — Encounter: Payer: Self-pay | Admitting: Sports Medicine

## 2015-03-29 ENCOUNTER — Ambulatory Visit (INDEPENDENT_AMBULATORY_CARE_PROVIDER_SITE_OTHER): Payer: Medicare Other | Admitting: Sports Medicine

## 2015-03-29 DIAGNOSIS — E119 Type 2 diabetes mellitus without complications: Secondary | ICD-10-CM | POA: Diagnosis not present

## 2015-03-29 DIAGNOSIS — B351 Tinea unguium: Secondary | ICD-10-CM | POA: Diagnosis not present

## 2015-03-29 DIAGNOSIS — M79676 Pain in unspecified toe(s): Secondary | ICD-10-CM

## 2015-03-29 NOTE — Progress Notes (Signed)
Patient ID: Samuel Thomas, male   DOB: 11/14/1935, 79 y.o.   MRN: [id number] Subjective: Samuel ChrisJimmy D Strom is a 79 y.o. male patient with history of type 2 diabetes who presents to office today complaining of long, painful nails  while ambulating in shoes; unable to trim. Patient states that the glucose reading this morning was "good". Admits he is in rehab for his lungs. Patient denies any new changes in medication or new problems. Patient denies any new cramping, numbness, burning or tingling in the legs.  Patient Active Problem List   Diagnosis Date Noted  . Bacterial skin infection of leg 01/16/2014  . Chronic diastolic heart failure (HCC) 01/16/2014  . Edema leg 01/16/2014  . Chronic combined systolic and diastolic heart failure (HCC) 01/02/2014  . Acute diastolic heart failure (HCC) 12/02/2013  . NSVT (nonsustained ventricular tachycardia) (HCC) 12/01/2013  . SIRS (systemic inflammatory response syndrome) (HCC) 12/01/2013  . Altered mental status 12/01/2013  . Hypokalemia 12/01/2013  . DM2 (diabetes mellitus, type 2) (HCC) 12/01/2013  . HTN (hypertension) 12/01/2013  . Hypoxia 12/01/2013  . H/O cataract extraction 06/16/2012  . Artificial lens present 06/16/2012  . Arthralgia of hip or thigh 01/12/2012  . Lumbosacral spondylosis 12/10/2011  . Pain in thoracic spine 12/10/2011  . Brachial neuritis 10/27/2011  . Hypercholesteremia 08/25/2011  . Cervical spinal stenosis 07/27/2011  . Cervical spinal cord compression (HCC) 07/17/2011  . Difficulty in walking 07/13/2011  . Thoracic and lumbosacral neuritis 07/13/2011  . Colon, diverticulosis 05/25/2011  . History of colon polyps 05/25/2011  . Astigmatism 04/10/2011  . Cataract, nuclear sclerotic senile 04/10/2011  . Diabetes mellitus type 2, uncontrolled (HCC) 04/10/2011  . LBP (low back pain) 03/31/2011  . Allergic rhinitis 06/20/2002  . Airway hyperreactivity 06/20/2002  . Atherosclerosis of coronary artery 06/17/2000  . Essential  (primary) hypertension 06/17/2000   Current Outpatient Prescriptions on File Prior to Visit  Medication Sig Dispense Refill  . atorvastatin (LIPITOR) 20 MG tablet Take 20 mg by mouth at bedtime.    . baclofen (LIORESAL) 10 MG tablet Take 10 mg by mouth as directed.    . beclomethasone (QVAR) 40 MCG/ACT inhaler Inhale 1-2 puffs into the lungs 2 (two) times daily.    Marland Kitchen. buPROPion (WELLBUTRIN XL) 150 MG 24 hr tablet   0  . clindamycin (CLEOCIN) 300 MG capsule   0  . FLUoxetine (PROZAC) 20 MG capsule Take 20 mg by mouth daily.    . fluticasone (FLONASE) 50 MCG/ACT nasal spray Place 1-2 sprays into both nostrils daily.    . furosemide (LASIX) 40 MG tablet Take 1 tablet (40 mg total) by mouth daily. 30 tablet 0  . gabapentin (NEURONTIN) 600 MG tablet Take 600 mg by mouth 3 (three) times daily.    . hydrochlorothiazide (HYDRODIURIL) 25 MG tablet   0  . HYDROcodone-acetaminophen (NORCO/VICODIN) 5-325 MG per tablet Take 1 tablet by mouth every 6 (six) hours as needed for moderate pain.    Marland Kitchen. insulin aspart (NOVOLOG) 100 UNIT/ML injection Inject 4 Units into the skin as directed.    . insulin glargine (LANTUS) 100 UNIT/ML injection Inject 25-30 Units into the skin 2 (two) times daily.    Marland Kitchen. levalbuterol (XOPENEX HFA) 45 MCG/ACT inhaler Inhale into the lungs every 4 (four) hours as needed for wheezing.    Marland Kitchen. loratadine (CLARITIN) 10 MG tablet Take 10 mg by mouth daily.    Marland Kitchen. losartan (COZAAR) 100 MG tablet Take 100 mg by mouth daily.    .Marland Kitchen  metFORMIN (GLUCOPHAGE-XR) 500 MG 24 hr tablet Take 1,000 mg by mouth 2 (two) times daily.    Marland Kitchen NIFEdipine (PROCARDIA XL/ADALAT-CC) 90 MG 24 hr tablet Take 90 mg by mouth daily.    . potassium chloride 20 MEQ TBCR Take 20 mEq by mouth daily. 30 tablet 0  . potassium chloride SA (K-DUR,KLOR-CON) 20 MEQ tablet   1  . tamsulosin (FLOMAX) 0.4 MG CAPS capsule Take 0.4 mg by mouth daily.    Gilman Schmidt TEST test strip   11  . TUDORZA PRESSAIR 400 MCG/ACT AEPB   3  . VOLTAREN 1  % GEL   11   No current facility-administered medications on file prior to visit.   Allergies  Allergen Reactions  . Penicillins Rash and Swelling    Break outs swelling Other reaction(s): SWELLING in 1955 Break outs swelling  . Prednisone Other (See Comments) and Rash    Makes mean  Significant mood and behavioral changes - 'felt like I wasn't in control of anything' Significant mood and behavioral changes - 'felt like I wasn't in control of anything' Makes mean      Labs: HEMOGLOBIN A1C- No recent lab on file  Objective: General: Patient is awake, alert, and oriented x 3 and in no acute distress.  Integument: Skin is warm, dry and supple bilateral. Nails are tender, long, thickened and  dystrophic with subungual debris, consistent with onychomycosis, 1-5 bilateral. No signs of infection. No open lesions or preulcerative lesions present bilateral. Remaining integument unremarkable.  Vasculature:  Dorsalis Pedis pulse 2/4 bilateral. Posterior Tibial pulse  2/4 bilateral.  Capillary fill time <3 sec 1-5 bilateral. Diminished hair growth to the level of the digits. Temperature gradient within normal limits. + varicosities and brawny hyperpigmentation present bilateral. Trace edema present bilateral.   Neurology: The patient has intact sensation measured with a 5.07/10g Semmes Weinstein Monofilament at all pedal sites bilateral . Vibratory sensation diminished bilateral with tuning fork. No Babinski sign present bilateral.   Musculoskeletal: No gross pedal deformities noted bilateral. Muscular strength 5/5 in all lower extremity muscular groups bilateral without pain or limitation on range of motion . No tenderness with calf compression bilateral.  Assessment and Plan: Problem List Items Addressed This Visit      Endocrine   DM2 (diabetes mellitus, type 2) (HCC) (Chronic)    Other Visit Diagnoses    Dermatophytosis of nail    -  Primary    Pain of toe, unspecified laterality           -Examined patient. -Discussed and educated patient on diabetic foot care, especially with  regards to the vascular, neurological and musculoskeletal systems.  -Stressed the importance of good glycemic control and the detriment of not  controlling glucose levels in relation to the foot. -Mechanically debrided all nails 1-5 bilateral using sterile nail nipper and filed with dremel without incident  -Answered all patient questions -Patient to return in 3 months for at risk foot care -Patient advised to call the office if any problems or questions arise in the meantime.  Asencion Islam, DPM

## 2015-03-29 NOTE — Patient Instructions (Signed)
Diabetes and Foot Care Diabetes may cause you to have problems because of poor blood supply (circulation) to your feet and legs. This may cause the skin on your feet to become thinner, break easier, and heal more slowly. Your skin may become dry, and the skin may peel and crack. You may also have nerve damage in your legs and feet causing decreased feeling in them. You may not notice minor injuries to your feet that could lead to infections or more serious problems. Taking care of your feet is one of the most important things you can do for yourself.  HOME CARE INSTRUCTIONS  Wear shoes at all times, even in the house. Do not go barefoot. Bare feet are easily injured.  Check your feet daily for blisters, cuts, and redness. If you cannot see the bottom of your feet, use a mirror or ask someone for help.  Wash your feet with warm water (do not use hot water) and mild soap. Then pat your feet and the areas between your toes until they are completely dry. Do not soak your feet as this can dry your skin.  Apply a moisturizing lotion or petroleum jelly (that does not contain alcohol and is unscented) to the skin on your feet and to dry, brittle toenails. Do not apply lotion between your toes.  Trim your toenails straight across. Do not dig under them or around the cuticle. File the edges of your nails with an emery board or nail file.  Do not cut corns or calluses or try to remove them with medicine.  Wear clean socks or stockings every day. Make sure they are not too tight. Do not wear knee-high stockings since they may decrease blood flow to your legs.  Wear shoes that fit properly and have enough cushioning. To break in new shoes, wear them for just a few hours a day. This prevents you from injuring your feet. Always look in your shoes before you put them on to be sure there are no objects inside.  Do not cross your legs. This may decrease the blood flow to your feet.  If you find a minor scrape,  cut, or break in the skin on your feet, keep it and the skin around it clean and dry. These areas may be cleansed with mild soap and water. Do not cleanse the area with peroxide, alcohol, or iodine.  When you remove an adhesive bandage, be sure not to damage the skin around it.  If you have a wound, look at it several times a day to make sure it is healing.  Do not use heating pads or hot water bottles. They may burn your skin. If you have lost feeling in your feet or legs, you may not know it is happening until it is too late.  Make sure your health care provider performs a complete foot exam at least annually or more often if you have foot problems. Report any cuts, sores, or bruises to your health care provider immediately. SEEK MEDICAL CARE IF:   You have an injury that is not healing.  You have cuts or breaks in the skin.  You have an ingrown nail.  You notice redness on your legs or feet.  You feel burning or tingling in your legs or feet.  You have pain or cramps in your legs and feet.  Your legs or feet are numb.  Your feet always feel cold. SEEK IMMEDIATE MEDICAL CARE IF:   There is increasing redness,   swelling, or pain in or around a wound.  There is a red line that goes up your leg.  Pus is coming from a wound.  You develop a fever or as directed by your health care provider.  You notice a bad smell coming from an ulcer or wound.   This information is not intended to replace advice given to you by your health care provider. Make sure you discuss any questions you have with your health care provider.   Document Released: 03/27/2000 Document Revised: 11/30/2012 Document Reviewed: 09/06/2012 Elsevier Interactive Patient Education 2016 [employer]  

## 2015-06-28 ENCOUNTER — Ambulatory Visit: Payer: Medicare Other | Admitting: Sports Medicine

## 2015-07-02 DIAGNOSIS — N401 Enlarged prostate with lower urinary tract symptoms: Secondary | ICD-10-CM

## 2015-07-02 DIAGNOSIS — N138 Other obstructive and reflux uropathy: Secondary | ICD-10-CM | POA: Insufficient documentation

## 2015-07-05 ENCOUNTER — Encounter: Payer: Self-pay | Admitting: Sports Medicine

## 2015-07-05 ENCOUNTER — Ambulatory Visit (INDEPENDENT_AMBULATORY_CARE_PROVIDER_SITE_OTHER): Payer: Medicare Other | Admitting: Sports Medicine

## 2015-07-05 DIAGNOSIS — M79676 Pain in unspecified toe(s): Secondary | ICD-10-CM

## 2015-07-05 DIAGNOSIS — B351 Tinea unguium: Secondary | ICD-10-CM

## 2015-07-05 DIAGNOSIS — E119 Type 2 diabetes mellitus without complications: Secondary | ICD-10-CM | POA: Diagnosis not present

## 2015-07-05 NOTE — Progress Notes (Signed)
Patient ID: Samuel Thomas, male   DOB: 1935-11-14, 80 y.o.   MRN: [id number]   Subjective: Samuel Thomas is a 80 y.o. male patient with history of type 2 diabetes who presents to office today complaining of long, painful nails  while ambulating in shoes; unable to trim. Patient states that the glucose reading this morning was "good". Patient denies any new changes in medication or new problems. Patient denies any new cramping, numbness, burning or tingling in the legs.  Patient Active Problem List   Diagnosis Date Noted  . Benign prostatic hyperplasia with urinary obstruction 07/02/2015  . Diverticulitis large intestine 01/03/2015  . Abdominal pain 01/02/2015  . Injury of kidney 01/02/2015  . N&V (nausea and vomiting) 01/02/2015  . Chronic pain associated with significant psychosocial dysfunction 10/04/2014  . Encounter for therapeutic drug level monitoring 08/29/2014  . Arthritis 07/24/2014  . Venous stasis ulcer of lower extremity (HCC) 07/24/2014  . Gonalgia 07/03/2014  . Diabetes mellitus (HCC) 05/17/2014  . Obstructive apnea 04/25/2014  . Drug-induced constipation 04/03/2014  . Arthritis of knee, degenerative 01/30/2014  . Bacterial skin infection of leg 01/16/2014  . Chronic diastolic heart failure (HCC) 01/16/2014  . Edema leg 01/16/2014  . Cellulitis of lower extremity 01/16/2014  . Chronic combined systolic and diastolic heart failure (HCC) 01/02/2014  . Acute diastolic heart failure (HCC) 12/02/2013  . NSVT (nonsustained ventricular tachycardia) (HCC) 12/01/2013  . SIRS (systemic inflammatory response syndrome) (HCC) 12/01/2013  . Altered mental status 12/01/2013  . Hypokalemia 12/01/2013  . DM2 (diabetes mellitus, type 2) (HCC) 12/01/2013  . HTN (hypertension) 12/01/2013  . Hypoxia 12/01/2013  . Continuous opioid dependence (HCC) 09/18/2013  . Pain in shoulder 07/21/2013  . Pain of right hand 05/24/2013  . Other long term (current) drug therapy 01/03/2013  . Muscle ache  11/23/2012  . Suicidal ideation 11/23/2012  . Cellulitis 09/25/2012  . Chronic obstructive pulmonary disease (HCC) 09/25/2012  . Apnea, sleep 09/25/2012  . Diabetes mellitus type 2 in obese (HCC) 08/15/2012  . Hook nail 08/15/2012  . Fungal infection of nail 08/15/2012  . Arthropathy of lumbar facet joint 07/08/2012  . Nerve root pain 07/07/2012  . Cannabis abuse 07/07/2012  . H/O cataract extraction 06/16/2012  . Artificial lens present 06/16/2012  . Other specified health status 05/10/2012  . Clinical depression 05/10/2012  . Lumbar canal stenosis 03/17/2012  . Degenerative arthritis of hip 03/17/2012  . Adiposity 03/17/2012  . H/O arthrodesis 03/17/2012  . Arthralgia of hip or thigh 01/12/2012  . Lumbosacral spondylosis 12/10/2011  . Pain in thoracic spine 12/10/2011  . Brachial neuritis 10/27/2011  . Hypercholesteremia 08/25/2011  . Hypercholesterolemia 08/25/2011  . Cervical spinal stenosis 07/27/2011  . Cervical spinal cord compression (HCC) 07/17/2011  . Difficulty in walking 07/13/2011  . Thoracic and lumbosacral neuritis 07/13/2011  . Colon, diverticulosis 05/25/2011  . History of colon polyps 05/25/2011  . Diverticular disease of large intestine 05/25/2011  . Astigmatism 04/10/2011  . Cataract, nuclear sclerotic senile 04/10/2011  . Diabetes mellitus type 2, uncontrolled (HCC) 04/10/2011  . LBP (low back pain) 03/31/2011  . Allergic rhinitis 06/20/2002  . Airway hyperreactivity 06/20/2002  . Atherosclerosis of coronary artery 06/17/2000  . Essential (primary) hypertension 06/17/2000   Current Outpatient Prescriptions on File Prior to Visit  Medication Sig Dispense Refill  . atorvastatin (LIPITOR) 20 MG tablet Take 20 mg by mouth at bedtime.    . baclofen (LIORESAL) 10 MG tablet Take 10 mg by mouth as directed.    Marland Kitchen  beclomethasone (QVAR) 40 MCG/ACT inhaler Inhale 1-2 puffs into the lungs 2 (two) times daily.    Marland Kitchen. buPROPion (WELLBUTRIN XL) 150 MG 24 hr tablet   0   . clindamycin (CLEOCIN) 300 MG capsule   0  . FLUoxetine (PROZAC) 20 MG capsule Take 20 mg by mouth daily.    . fluticasone (FLONASE) 50 MCG/ACT nasal spray Place 1-2 sprays into both nostrils daily.    . furosemide (LASIX) 40 MG tablet Take 1 tablet (40 mg total) by mouth daily. 30 tablet 0  . gabapentin (NEURONTIN) 600 MG tablet Take 600 mg by mouth 3 (three) times daily.    . hydrochlorothiazide (HYDRODIURIL) 25 MG tablet   0  . HYDROcodone-acetaminophen (NORCO/VICODIN) 5-325 MG per tablet Take 1 tablet by mouth every 6 (six) hours as needed for moderate pain.    Marland Kitchen. insulin aspart (NOVOLOG) 100 UNIT/ML injection Inject 4 Units into the skin as directed.    . insulin glargine (LANTUS) 100 UNIT/ML injection Inject 25-30 Units into the skin 2 (two) times daily.    Marland Kitchen. levalbuterol (XOPENEX HFA) 45 MCG/ACT inhaler Inhale into the lungs every 4 (four) hours as needed for wheezing.    Marland Kitchen. loratadine (CLARITIN) 10 MG tablet Take 10 mg by mouth daily.    Marland Kitchen. losartan (COZAAR) 100 MG tablet Take 100 mg by mouth daily.    . metFORMIN (GLUCOPHAGE-XR) 500 MG 24 hr tablet Take 1,000 mg by mouth 2 (two) times daily.    Marland Kitchen. NIFEdipine (PROCARDIA XL/ADALAT-CC) 90 MG 24 hr tablet Take 90 mg by mouth daily.    . potassium chloride 20 MEQ TBCR Take 20 mEq by mouth daily. 30 tablet 0  . potassium chloride SA (K-DUR,KLOR-CON) 20 MEQ tablet   1  . tamsulosin (FLOMAX) 0.4 MG CAPS capsule Take 0.4 mg by mouth daily.    Gilman Schmidt. TRUETRACK TEST test strip   11  . TUDORZA PRESSAIR 400 MCG/ACT AEPB   3  . VOLTAREN 1 % GEL   11   No current facility-administered medications on file prior to visit.   Allergies  Allergen Reactions  . Penicillins Rash and Swelling    Break outs swelling Other reaction(s): SWELLING in 1955 Break outs swelling  . Prednisone Other (See Comments) and Rash    Makes mean  Significant mood and behavioral changes - 'felt like I wasn't in control of anything' Significant mood and behavioral changes -  'felt like I wasn't in control of anything' Makes mean    Objective: General: Patient is awake, alert, and oriented x 3 and in no acute distress.  Integument: Skin is warm, dry and supple bilateral. Nails are tender, long, thickened and  dystrophic with subungual debris, consistent with onychomycosis, 1-5 bilateral. No signs of infection. No open lesions or preulcerative lesions present bilateral. Remaining integument unremarkable.  Vasculature:  Dorsalis Pedis pulse 2/4 bilateral. Posterior Tibial pulse  2/4 bilateral.  Capillary fill time <3 sec 1-5 bilateral. Diminished hair growth to the level of the digits. Temperature gradient within normal limits. + varicosities and brawny hyperpigmentation present bilateral. Trace edema present bilateral.   Neurology: The patient has intact sensation measured with a 5.07/10g Semmes Weinstein Monofilament at all pedal sites bilateral . Vibratory sensation diminished bilateral with tuning fork. No Babinski sign present bilateral.   Musculoskeletal: No gross pedal deformities noted bilateral. Muscular strength 5/5 in all lower extremity muscular groups bilateral without pain or limitation on range of motion . No tenderness with calf compression bilateral.  Assessment  and Plan: Problem List Items Addressed This Visit      Endocrine   DM2 (diabetes mellitus, type 2) (HCC) (Chronic)    Other Visit Diagnoses    Dermatophytosis of nail    -  Primary    Pain of toe, unspecified laterality          -Examined patient. -Discussed and educated patient on diabetic foot care, especially with  regards to the vascular, neurological and musculoskeletal systems.  -Stressed the importance of good glycemic control and the detriment of not  controlling glucose levels in relation to the foot. -Mechanically debrided all nails 1-5 bilateral using sterile nail nipper and filed with dremel without incident  -Answered all patient questions -Patient to return in 3  months for at risk foot care -Patient advised to call the office if any problems or questions arise in the meantime.  Asencion Islam, DPM

## 2015-07-24 DIAGNOSIS — I10 Essential (primary) hypertension: Secondary | ICD-10-CM | POA: Insufficient documentation

## 2015-08-15 DIAGNOSIS — R059 Cough, unspecified: Secondary | ICD-10-CM | POA: Insufficient documentation

## 2015-08-15 DIAGNOSIS — R05 Cough: Secondary | ICD-10-CM | POA: Insufficient documentation

## 2015-08-15 DIAGNOSIS — R0602 Shortness of breath: Secondary | ICD-10-CM | POA: Insufficient documentation

## 2015-08-16 DIAGNOSIS — I5030 Unspecified diastolic (congestive) heart failure: Secondary | ICD-10-CM | POA: Insufficient documentation

## 2015-08-16 DIAGNOSIS — I272 Pulmonary hypertension, unspecified: Secondary | ICD-10-CM | POA: Insufficient documentation

## 2015-09-10 DIAGNOSIS — N400 Enlarged prostate without lower urinary tract symptoms: Secondary | ICD-10-CM | POA: Insufficient documentation

## 2015-10-11 ENCOUNTER — Ambulatory Visit (INDEPENDENT_AMBULATORY_CARE_PROVIDER_SITE_OTHER): Payer: Medicare Other | Admitting: Sports Medicine

## 2015-10-11 ENCOUNTER — Encounter: Payer: Self-pay | Admitting: Sports Medicine

## 2015-10-11 DIAGNOSIS — M79676 Pain in unspecified toe(s): Secondary | ICD-10-CM | POA: Diagnosis not present

## 2015-10-11 DIAGNOSIS — B351 Tinea unguium: Secondary | ICD-10-CM

## 2015-10-11 DIAGNOSIS — E119 Type 2 diabetes mellitus without complications: Secondary | ICD-10-CM

## 2015-10-11 NOTE — Progress Notes (Signed)
Patient ID: Samuel Thomas, male   DOB: [birth year], 80 y.o.   MRN: [id number] Subjective: Samuel Thomas is a 80 y.o. male patient with history of type 2 diabetes who presents to office today complaining of long, painful nails  while ambulating in shoes; unable to trim. Patient states that the glucose reading this morning was "ok"; States that he is dealing with fluid retention and has to follow up with PCP. Patient denies any new changes in medication or new problems. Patient denies any new cramping, numbness, burning or tingling in the legs.  Patient Active Problem List   Diagnosis Date Noted  . Benign fibroma of prostate 09/10/2015  . Diastolic heart failure (HCC) 08/16/2015  . Pulmonary hypertension (HCC) 08/16/2015  . Cough 08/15/2015  . Breath shortness 08/15/2015  . BP (high blood pressure) 07/24/2015  . Benign prostatic hyperplasia with urinary obstruction 07/02/2015  . Diverticulitis large intestine 01/03/2015  . Abdominal pain 01/02/2015  . Injury of kidney 01/02/2015  . N&V (nausea and vomiting) 01/02/2015  . Chronic pain associated with significant psychosocial dysfunction 10/04/2014  . Encounter for therapeutic drug level monitoring 08/29/2014  . Arthritis 07/24/2014  . Venous stasis ulcer of lower extremity (HCC) 07/24/2014  . Gonalgia 07/03/2014  . Diabetes mellitus (HCC) 05/17/2014  . Obstructive apnea 04/25/2014  . Drug-induced constipation 04/03/2014  . Arthritis of knee, degenerative 01/30/2014  . Bacterial skin infection of leg 01/16/2014  . Chronic diastolic heart failure (HCC) 01/16/2014  . Edema leg 01/16/2014  . Cellulitis of lower extremity 01/16/2014  . Chronic combined systolic and diastolic heart failure (HCC) 01/02/2014  . Acute diastolic heart failure (HCC) 12/02/2013  . NSVT (nonsustained ventricular tachycardia) (HCC) 12/01/2013  . SIRS (systemic inflammatory response syndrome) (HCC) 12/01/2013  . Altered mental status 12/01/2013  . Hypokalemia 12/01/2013   . DM2 (diabetes mellitus, type 2) (HCC) 12/01/2013  . HTN (hypertension) 12/01/2013  . Hypoxia 12/01/2013  . Continuous opioid dependence (HCC) 09/18/2013  . Pain in shoulder 07/21/2013  . Pain of right hand 05/24/2013  . Other long term (current) drug therapy 01/03/2013  . Muscle ache 11/23/2012  . Suicidal ideation 11/23/2012  . Cellulitis 09/25/2012  . Chronic obstructive pulmonary disease (HCC) 09/25/2012  . Apnea, sleep 09/25/2012  . Diabetes mellitus type 2 in obese (HCC) 08/15/2012  . Hook nail 08/15/2012  . Fungal infection of nail 08/15/2012  . Arthropathy of lumbar facet joint 07/08/2012  . Nerve root pain 07/07/2012  . Cannabis abuse 07/07/2012  . H/O cataract extraction 06/16/2012  . Artificial lens present 06/16/2012  . Other specified health status 05/10/2012  . Clinical depression 05/10/2012  . Lumbar canal stenosis 03/17/2012  . Degenerative arthritis of hip 03/17/2012  . Adiposity 03/17/2012  . H/O arthrodesis 03/17/2012  . Arthralgia of hip or thigh 01/12/2012  . Lumbosacral spondylosis 12/10/2011  . Pain in thoracic spine 12/10/2011  . Brachial neuritis 10/27/2011  . Hypercholesteremia 08/25/2011  . Hypercholesterolemia 08/25/2011  . Cervical spinal stenosis 07/27/2011  . Cervical spinal cord compression (HCC) 07/17/2011  . Difficulty in walking 07/13/2011  . Thoracic and lumbosacral neuritis 07/13/2011  . Colon, diverticulosis 05/25/2011  . History of colon polyps 05/25/2011  . Diverticular disease of large intestine 05/25/2011  . Astigmatism 04/10/2011  . Cataract, nuclear sclerotic senile 04/10/2011  . Diabetes mellitus type 2, uncontrolled (HCC) 04/10/2011  . LBP (low back pain) 03/31/2011  . Allergic rhinitis 06/20/2002  . Airway hyperreactivity 06/20/2002  . Atherosclerosis of coronary artery 06/17/2000  . Essential (primary)  hypertension 06/17/2000   Current Outpatient Prescriptions on File Prior to Visit  Medication Sig Dispense Refill  .  atorvastatin (LIPITOR) 20 MG tablet Take 20 mg by mouth at bedtime.    . baclofen (LIORESAL) 10 MG tablet Take 10 mg by mouth as directed.    . beclomethasone (QVAR) 40 MCG/ACT inhaler Inhale 1-2 puffs into the lungs 2 (two) times daily.    Marland Kitchen. buPROPion (WELLBUTRIN XL) 150 MG 24 hr tablet   0  . clindamycin (CLEOCIN) 300 MG capsule   0  . FLUoxetine (PROZAC) 20 MG capsule Take 20 mg by mouth daily.    . fluticasone (FLONASE) 50 MCG/ACT nasal spray Place 1-2 sprays into both nostrils daily.    . furosemide (LASIX) 40 MG tablet Take 1 tablet (40 mg total) by mouth daily. 30 tablet 0  . gabapentin (NEURONTIN) 600 MG tablet Take 600 mg by mouth 3 (three) times daily.    . hydrochlorothiazide (HYDRODIURIL) 25 MG tablet   0  . HYDROcodone-acetaminophen (NORCO/VICODIN) 5-325 MG per tablet Take 1 tablet by mouth every 6 (six) hours as needed for moderate pain.    Marland Kitchen. insulin aspart (NOVOLOG) 100 UNIT/ML injection Inject 4 Units into the skin as directed.    . insulin glargine (LANTUS) 100 UNIT/ML injection Inject 25-30 Units into the skin 2 (two) times daily.    Marland Kitchen. levalbuterol (XOPENEX HFA) 45 MCG/ACT inhaler Inhale into the lungs every 4 (four) hours as needed for wheezing.    Marland Kitchen. loratadine (CLARITIN) 10 MG tablet Take 10 mg by mouth daily.    Marland Kitchen. losartan (COZAAR) 100 MG tablet Take 100 mg by mouth daily.    . metFORMIN (GLUCOPHAGE-XR) 500 MG 24 hr tablet Take 1,000 mg by mouth 2 (two) times daily.    Marland Kitchen. NIFEdipine (PROCARDIA XL/ADALAT-CC) 90 MG 24 hr tablet Take 90 mg by mouth daily.    . potassium chloride 20 MEQ TBCR Take 20 mEq by mouth daily. 30 tablet 0  . potassium chloride SA (K-DUR,KLOR-CON) 20 MEQ tablet   1  . tamsulosin (FLOMAX) 0.4 MG CAPS capsule Take 0.4 mg by mouth daily.    Gilman Schmidt. TRUETRACK TEST test strip   11  . TUDORZA PRESSAIR 400 MCG/ACT AEPB   3  . VOLTAREN 1 % GEL   11   No current facility-administered medications on file prior to visit.   Allergies  Allergen Reactions  .  Penicillins Rash and Swelling    Break outs swelling Other reaction(s): SWELLING in 1955 Break outs swelling  . Prednisone Other (See Comments) and Rash    Makes mean  Significant mood and behavioral changes - 'felt like I wasn't in control of anything' Significant mood and behavioral changes - 'felt like I wasn't in control of anything' Makes mean    Objective: General: Patient is awake, alert, and oriented x 3 and in no acute distress.  Integument: Skin is warm, dry and supple bilateral. Nails are tender, long, thickened and  dystrophic with subungual debris, consistent with onychomycosis, 1-5 bilateral. No signs of infection. No open lesions or preulcerative lesions present bilateral. Remaining integument unremarkable.  Vasculature:  Dorsalis Pedis pulse 2/4 bilateral. Posterior Tibial pulse  2/4 bilateral.  Capillary fill time <3 sec 1-5 bilateral. Diminished hair growth to the level of the digits. Temperature gradient within normal limits. + varicosities and brawny hyperpigmentation present bilateral. Trace edema present bilateral.   Neurology: The patient has intact sensation measured with a 5.07/10g Semmes Weinstein Monofilament at all pedal  sites bilateral . Vibratory sensation diminished bilateral with tuning fork. No Babinski sign present bilateral.   Musculoskeletal: No gross pedal deformities noted bilateral. Muscular strength 5/5 in all lower extremity muscular groups bilateral without pain or limitation on range of motion . No tenderness with calf compression bilateral.  Assessment and Plan: Problem List Items Addressed This Visit    None    Visit Diagnoses    Dermatophytosis of nail    -  Primary    Pain of toe, unspecified laterality        Type 2 diabetes mellitus without complication, unspecified long term insulin use status (HCC)          -Examined patient. -Discussed and educated patient on diabetic foot care, especially with  regards to the vascular,  neurological and musculoskeletal systems.  -Stressed the importance of good glycemic control and the detriment of not  controlling glucose levels in relation to the foot. -Mechanically debrided all nails 1-5 bilateral using sterile nail nipper and filed with dremel without incident  -Answered all patient questions -Patient to return in 3 months for at risk foot care -Patient advised to call the office if any problems or questions arise in the meantime.  Samuel Thomas, DPM

## 2015-12-17 ENCOUNTER — Ambulatory Visit: Payer: Medicare Other | Admitting: Podiatry

## 2016-02-18 ENCOUNTER — Ambulatory Visit (INDEPENDENT_AMBULATORY_CARE_PROVIDER_SITE_OTHER): Payer: Medicare Other | Admitting: Podiatry

## 2016-02-18 ENCOUNTER — Encounter: Payer: Self-pay | Admitting: Podiatry

## 2016-02-18 VITALS — BP 140/69 | HR 75

## 2016-02-18 DIAGNOSIS — L603 Nail dystrophy: Secondary | ICD-10-CM | POA: Diagnosis not present

## 2016-02-18 DIAGNOSIS — L608 Other nail disorders: Secondary | ICD-10-CM

## 2016-02-18 DIAGNOSIS — M79609 Pain in unspecified limb: Secondary | ICD-10-CM | POA: Diagnosis not present

## 2016-02-18 DIAGNOSIS — B351 Tinea unguium: Secondary | ICD-10-CM | POA: Diagnosis not present

## 2016-02-18 DIAGNOSIS — L601 Onycholysis: Secondary | ICD-10-CM

## 2016-02-18 DIAGNOSIS — E0843 Diabetes mellitus due to underlying condition with diabetic autonomic (poly)neuropathy: Secondary | ICD-10-CM

## 2016-02-18 NOTE — Progress Notes (Signed)
SUBJECTIVE Patient with a history of diabetes mellitus presents to office today complaining of elongated, thickened nails. Pain while ambulating in shoes. Patient is unable to trim their own nails.  Patient also has a complaint of a painful left great toenail which she states is partially detached. Patient denies trauma.  Allergies  Allergen Reactions  . Penicillins Rash and Swelling    Break outs swelling Other reaction(s): SWELLING in 1955 Break outs swelling  . Prednisone Other (See Comments) and Rash    Makes mean  Significant mood and behavioral changes - 'felt like I wasn't in control of anything' Significant mood and behavioral changes - 'felt like I wasn't in control of anything' Makes mean     OBJECTIVE General Patient is awake, alert, and oriented x 3 and in no acute distress. Derm Partially detached left great toenail noted with subungual maceration. Skin is dry and supple bilateral. Negative open lesions or macerations. Remaining integument unremarkable. Nails are tender, long, thickened and dystrophic with subungual debris, consistent with onychomycosis, 1-5 bilateral. No signs of infection noted. Vasc  DP and PT pedal pulses palpable bilaterally. Temperature gradient within normal limits.  Neuro Epicritic and protective threshold sensation diminished bilaterally.  Musculoskeletal Exam No symptomatic pedal deformities noted bilateral. Muscular strength within normal limits.  ASSESSMENT 1. Diabetes Mellitus w/ peripheral neuropathy 2. Onychomycosis of nail due to dermatophyte bilateral 3. Pain in foot bilateral 4. Partially detached left great toenail with subungual debris  PLAN OF CARE 1. Patient evaluated today. 2. Instructed to maintain good pedal hygiene and foot care. Stressed importance of controlling blood sugar.  3. Mechanical debridement of nails 1-5 bilaterally performed using a nail nipper. Filed with dremel without incident.  4. Total temporary nail avulsion  was performed to the left great toenail. There was no local anesthesia infiltration utilized due to the neuropathy of the patient. Dry sterile dressing was applied. 5. Postprocedure care instructions were provided to patient regarding daily soaks and antibiotic application with Band-Aid daily. 6. Return to clinic in 3 mos.    Felecia ShellingBrent M Exander Shaul, DPM

## 2016-03-24 DIAGNOSIS — Z9181 History of falling: Secondary | ICD-10-CM | POA: Insufficient documentation

## 2016-03-24 DIAGNOSIS — Z79899 Other long term (current) drug therapy: Secondary | ICD-10-CM | POA: Insufficient documentation

## 2016-05-21 ENCOUNTER — Ambulatory Visit: Payer: Medicare Other | Admitting: Podiatry

## 2016-05-25 ENCOUNTER — Ambulatory Visit (INDEPENDENT_AMBULATORY_CARE_PROVIDER_SITE_OTHER): Payer: Medicare Other | Admitting: Podiatry

## 2016-05-25 ENCOUNTER — Encounter: Payer: Self-pay | Admitting: Podiatry

## 2016-05-25 DIAGNOSIS — B351 Tinea unguium: Secondary | ICD-10-CM

## 2016-05-25 DIAGNOSIS — M79609 Pain in unspecified limb: Secondary | ICD-10-CM | POA: Diagnosis not present

## 2016-05-25 NOTE — Progress Notes (Signed)
Complaint:  Visit Type: Patient returns to my office for continued preventative foot care services. Complaint: Patient states" my nails have grown long and thick and become painful to walk and wear shoes" Patient has been diagnosed with DM with no foot complications. The patient presents for preventative foot care services. No changes to ROS  Podiatric Exam: Vascular: dorsalis pedis and posterior tibial pulses are palpable bilateral. Capillary return is immediate. Temperature gradient is WNL. Skin turgor WNL  Sensorium: Diminished  Semmes Weinstein monofilament test. Normal tactile sensation bilaterally. Nail Exam: Pt has thick disfigured discolored nails with subungual debris noted bilateral entire nail hallux through fifth toenails Ulcer Exam: There is no evidence of ulcer or pre-ulcerative changes or infection. Orthopedic Exam: Muscle tone and strength are WNL. No limitations in general ROM. No crepitus or effusions noted. Foot type and digits show no abnormalities. Bony prominences are unremarkable. Skin: No Porokeratosis. No infection or ulcers  Diagnosis:  Onychomycosis, , Pain in right toe, pain in left toes  Treatment & Plan Procedures and Treatment: Consent by patient was obtained for treatment procedures. The patient understood the discussion of treatment and procedures well. All questions were answered thoroughly reviewed. Debridement of mycotic and hypertrophic toenails, 1 through 5 bilateral and clearing of subungual debris. No ulceration, no infection noted.  Return Visit-Office Procedure: Patient instructed to return to the office for a follow up visit 3 months for continued evaluation and treatment.    Ebb Carelock DPM 

## 2016-08-24 ENCOUNTER — Ambulatory Visit (INDEPENDENT_AMBULATORY_CARE_PROVIDER_SITE_OTHER): Payer: Medicare Other | Admitting: Podiatry

## 2016-08-24 ENCOUNTER — Encounter: Payer: Self-pay | Admitting: Podiatry

## 2016-08-24 DIAGNOSIS — M79609 Pain in unspecified limb: Secondary | ICD-10-CM

## 2016-08-24 DIAGNOSIS — Z794 Long term (current) use of insulin: Secondary | ICD-10-CM

## 2016-08-24 DIAGNOSIS — B351 Tinea unguium: Secondary | ICD-10-CM

## 2016-08-24 DIAGNOSIS — E119 Type 2 diabetes mellitus without complications: Secondary | ICD-10-CM

## 2016-08-24 NOTE — Progress Notes (Signed)
Complaint:  Visit Type: Patient returns to my office for continued preventative foot care services. Complaint: Patient states" my nails have grown long and thick and become painful to walk and wear shoes" Patient has been diagnosed with DM with no foot complications. The patient presents for preventative foot care services. No changes to ROS  Podiatric Exam: Vascular: dorsalis pedis and posterior tibial pulses are palpable bilateral. Capillary return is immediate. Temperature gradient is WNL. Skin turgor WNL  Sensorium: Diminished  Semmes Weinstein monofilament test. Normal tactile sensation bilaterally. Nail Exam: Pt has thick disfigured discolored nails with subungual debris noted bilateral entire nail hallux through fifth toenails Ulcer Exam: There is no evidence of ulcer or pre-ulcerative changes or infection. Orthopedic Exam: Muscle tone and strength are WNL. No limitations in general ROM. No crepitus or effusions noted. Foot type and digits show no abnormalities. Bony prominences are unremarkable. Skin: No Porokeratosis. No infection or ulcers  Diagnosis:  Onychomycosis, , Pain in right toe, pain in left toes  Treatment & Plan Procedures and Treatment: Consent by patient was obtained for treatment procedures. The patient understood the discussion of treatment and procedures well. All questions were answered thoroughly reviewed. Debridement of mycotic and hypertrophic toenails, 1 through 5 bilateral and clearing of subungual debris. No ulceration, no infection noted.  Return Visit-Office Procedure: Patient instructed to return to the office for a follow up visit 3 months for continued evaluation and treatment.    [name] DPM 

## 2016-11-30 ENCOUNTER — Ambulatory Visit (INDEPENDENT_AMBULATORY_CARE_PROVIDER_SITE_OTHER): Payer: Medicare Other | Admitting: Podiatry

## 2016-11-30 ENCOUNTER — Encounter: Payer: Self-pay | Admitting: Podiatry

## 2016-11-30 DIAGNOSIS — M79609 Pain in unspecified limb: Secondary | ICD-10-CM | POA: Diagnosis not present

## 2016-11-30 DIAGNOSIS — B351 Tinea unguium: Secondary | ICD-10-CM | POA: Diagnosis not present

## 2016-11-30 DIAGNOSIS — E119 Type 2 diabetes mellitus without complications: Secondary | ICD-10-CM

## 2016-11-30 DIAGNOSIS — Z794 Long term (current) use of insulin: Secondary | ICD-10-CM

## 2016-11-30 NOTE — Progress Notes (Signed)
Complaint:  Visit Type: Patient returns to my office for continued preventative foot care services. Complaint: Patient states" my nails have grown long and thick and become painful to walk and wear shoes" Patient has been diagnosed with DM with no foot complications. The patient presents for preventative foot care services. No changes to ROS  Podiatric Exam: Vascular: dorsalis pedis and posterior tibial pulses are palpable bilateral. Capillary return is immediate. Temperature gradient is WNL. Skin turgor WNL  Sensorium: Diminished  Semmes Weinstein monofilament test. Normal tactile sensation bilaterally. Nail Exam: Pt has thick disfigured discolored nails with subungual debris noted bilateral entire nail hallux through fifth toenails Ulcer Exam: There is no evidence of ulcer or pre-ulcerative changes or infection. Orthopedic Exam: Muscle tone and strength are WNL. No limitations in general ROM. No crepitus or effusions noted. Foot type and digits show no abnormalities. Bony prominences are unremarkable. Skin: No Porokeratosis. No infection or ulcers  Diagnosis:  Onychomycosis, , Pain in right toe, pain in left toes  Treatment & Plan Procedures and Treatment: Consent by patient was obtained for treatment procedures. The patient understood the discussion of treatment and procedures well. All questions were answered thoroughly reviewed. Debridement of mycotic and hypertrophic toenails, 1 through 5 bilateral and clearing of subungual debris. No ulceration, no infection noted.  Return Visit-Office Procedure: Patient instructed to return to the office for a follow up visit 3 months for continued evaluation and treatment.    Murry Khiev DPM 

## 2017-03-01 ENCOUNTER — Ambulatory Visit: Payer: Medicare Other | Admitting: Podiatry

## 2017-03-09 DIAGNOSIS — H903 Sensorineural hearing loss, bilateral: Secondary | ICD-10-CM | POA: Insufficient documentation

## 2017-03-29 ENCOUNTER — Ambulatory Visit: Payer: Medicare Other

## 2017-07-01 DIAGNOSIS — I480 Paroxysmal atrial fibrillation: Secondary | ICD-10-CM | POA: Insufficient documentation

## 2017-07-01 DIAGNOSIS — I4891 Unspecified atrial fibrillation: Secondary | ICD-10-CM | POA: Insufficient documentation

## 2017-07-20 DIAGNOSIS — R339 Retention of urine, unspecified: Secondary | ICD-10-CM | POA: Insufficient documentation

## 2017-07-20 DIAGNOSIS — N3281 Overactive bladder: Secondary | ICD-10-CM | POA: Insufficient documentation

## 2017-09-27 ENCOUNTER — Ambulatory Visit: Payer: Medicare Other | Admitting: Podiatry

## 2017-10-27 DIAGNOSIS — G9340 Encephalopathy, unspecified: Secondary | ICD-10-CM | POA: Insufficient documentation

## 2017-10-27 DIAGNOSIS — R55 Syncope and collapse: Secondary | ICD-10-CM | POA: Insufficient documentation

## 2017-11-01 ENCOUNTER — Ambulatory Visit (INDEPENDENT_AMBULATORY_CARE_PROVIDER_SITE_OTHER): Payer: Medicare Other | Admitting: Podiatry

## 2017-11-01 ENCOUNTER — Encounter: Payer: Self-pay | Admitting: Podiatry

## 2017-11-01 DIAGNOSIS — E1142 Type 2 diabetes mellitus with diabetic polyneuropathy: Secondary | ICD-10-CM | POA: Diagnosis not present

## 2017-11-01 DIAGNOSIS — T148XXA Other injury of unspecified body region, initial encounter: Secondary | ICD-10-CM | POA: Diagnosis not present

## 2017-11-01 DIAGNOSIS — L601 Onycholysis: Secondary | ICD-10-CM

## 2017-11-01 NOTE — Progress Notes (Signed)
This patient presents to the office for evaluation of his diabetic feet.  He says that the big toenail on the right foot has turned black due to multiple falls that he is experiencing in the last 2 months.he states that there is not much pain or discomfort associated with the nail today.  This patient is diabetic with neuropathy.  He presents the office today for an evaluation of the right toenail. Patient has not been seen for over 11 months.  General Appearance  Alert, conversant and in no acute stress.  Vascular  Dorsalis pedis and posterior tibial  pulses are palpable  bilaterally.  Capillary return is within normal limits  bilaterally. Temperature is within normal limits  bilaterally.  Neurologic  Senn-Weinstein monofilament wire test diminished   bilaterally. Muscle power within normal limits bilaterally.  Nails Thick disfigured discolored nails with subungual debris  Hallux with blackened nail plate right hallux.  The nail is thin and unattached to nail bed.    Orthopedic  No limitations of motion of motion feet .  No crepitus or effusions noted.  No bony pathology or digital deformities noted.  Skin  normotropic skin with no porokeratosis noted bilaterally.  No signs of infections or ulcers noted.    Hematoma right hallux.  Nail injury right hallux nail.  ROV.  Upon evaluation of his right hallux toenail. /blood was noted under the nail plate right foot.  Incision and drainage of the hematoma was performed.  The nail, which was disease, inflamed was then removed exposing the nail bed.  This nail bed was then bandaged with Neosporin and dry sterile dressing.  No evidence of infection was noted.  Home instructions were sent home with this patient for soaks and bandaging.  He is to return  to the office in 3 months for preventative foot care services.  He is to call if there is a problem encountered with healing of hematoma/nail plate injury.   Helane GuntherGregory Hanzel Pizzo DPM

## 2018-01-31 ENCOUNTER — Ambulatory Visit: Payer: Medicare Other | Admitting: Podiatry

## 2018-02-14 ENCOUNTER — Ambulatory Visit: Payer: Medicare Other | Admitting: Podiatry

## 2018-02-14 ENCOUNTER — Encounter

## 2020-04-02 DIAGNOSIS — Z8739 Personal history of other diseases of the musculoskeletal system and connective tissue: Secondary | ICD-10-CM | POA: Insufficient documentation

## 2020-05-20 ENCOUNTER — Ambulatory Visit (INDEPENDENT_AMBULATORY_CARE_PROVIDER_SITE_OTHER): Payer: Medicare Other | Admitting: Podiatry

## 2020-05-20 ENCOUNTER — Other Ambulatory Visit: Payer: Self-pay

## 2020-05-20 ENCOUNTER — Encounter: Payer: Self-pay | Admitting: Podiatry

## 2020-05-20 DIAGNOSIS — E1169 Type 2 diabetes mellitus with other specified complication: Secondary | ICD-10-CM | POA: Diagnosis not present

## 2020-05-20 DIAGNOSIS — L97919 Non-pressure chronic ulcer of unspecified part of right lower leg with unspecified severity: Secondary | ICD-10-CM

## 2020-05-20 DIAGNOSIS — I83019 Varicose veins of right lower extremity with ulcer of unspecified site: Secondary | ICD-10-CM

## 2020-05-20 DIAGNOSIS — E669 Obesity, unspecified: Secondary | ICD-10-CM

## 2020-05-20 DIAGNOSIS — B351 Tinea unguium: Secondary | ICD-10-CM | POA: Insufficient documentation

## 2020-05-20 DIAGNOSIS — M79674 Pain in right toe(s): Secondary | ICD-10-CM

## 2020-05-20 DIAGNOSIS — M79675 Pain in left toe(s): Secondary | ICD-10-CM

## 2020-05-20 DIAGNOSIS — I83029 Varicose veins of left lower extremity with ulcer of unspecified site: Secondary | ICD-10-CM

## 2020-05-20 DIAGNOSIS — L97929 Non-pressure chronic ulcer of unspecified part of left lower leg with unspecified severity: Secondary | ICD-10-CM

## 2020-05-20 NOTE — Progress Notes (Signed)
This patient returns to my office for at risk foot care.  This patient requires this care by a professional since this patient will be at risk due to having diabetes and ,venous stasis  This patient is unable to cut nails himself since the patient cannot reach his nails.These nails are painful walking and wearing shoes.  This patient presents for at risk foot care today. Patient has not been seen in over 2 years.  General Appearance  Alert, conversant and in no acute stress.  Vascular  Dorsalis pedis and posterior tibial  pulses are palpable  bilaterally.  Capillary return is within normal limits  bilaterally. Temperature is within normal limits  bilaterally.  Neurologic  Senn-Weinstein monofilament wire test within normal limits  bilaterally. Muscle power within normal limits bilaterally.  Nails Thick disfigured discolored nails with subungual debris  from hallux to fifth toes bilaterally. No evidence of bacterial infection or drainage bilaterally.  Orthopedic  No limitations of motion  feet .  No crepitus or effusions noted.  No bony pathology or digital deformities noted.  Skin  normotropic skin with no porokeratosis noted bilaterally.  No signs of infections or ulcers noted.     Onychomycosis  Pain in right toes  Pain in left toes  Consent was obtained for treatment procedures.   Mechanical debridement of nails 1-5  bilaterally performed with a nail nipper.  Filed with dremel without incident.    Return office visit    3 months                 Told patient to return for periodic foot care and evaluation due to potential at risk complications.   Helane Gunther DPM

## 2020-08-19 ENCOUNTER — Other Ambulatory Visit: Payer: Self-pay

## 2020-08-19 ENCOUNTER — Ambulatory Visit (INDEPENDENT_AMBULATORY_CARE_PROVIDER_SITE_OTHER): Payer: Medicare Other | Admitting: Podiatry

## 2020-08-19 ENCOUNTER — Encounter: Payer: Self-pay | Admitting: Podiatry

## 2020-08-19 DIAGNOSIS — B351 Tinea unguium: Secondary | ICD-10-CM

## 2020-08-19 DIAGNOSIS — E1169 Type 2 diabetes mellitus with other specified complication: Secondary | ICD-10-CM | POA: Diagnosis not present

## 2020-08-19 DIAGNOSIS — E669 Obesity, unspecified: Secondary | ICD-10-CM

## 2020-08-19 DIAGNOSIS — M79675 Pain in left toe(s): Secondary | ICD-10-CM

## 2020-08-19 DIAGNOSIS — M79674 Pain in right toe(s): Secondary | ICD-10-CM

## 2020-08-19 NOTE — Progress Notes (Signed)
This patient returns to my office for at risk foot care.  This patient requires this care by a professional since this patient will be at risk due to having diabetes and ,venous stasis  This patient is unable to cut nails himself since the patient cannot reach his nails.These nails are painful walking and wearing shoes.  This patient presents for at risk foot care today.   General Appearance  Alert, conversant and in no acute stress.  Vascular  Dorsalis pedis and posterior tibial  pulses are palpable  bilaterally.  Capillary return is within normal limits  bilaterally. Temperature is within normal limits  bilaterally.  Neurologic  Senn-Weinstein monofilament wire test within normal limits  bilaterally. Muscle power within normal limits bilaterally.  Nails Thick disfigured discolored nails with subungual debris  from hallux to fifth toes bilaterally. No evidence of bacterial infection or drainage bilaterally.  Orthopedic  No limitations of motion  feet .  No crepitus or effusions noted.  No bony pathology or digital deformities noted.  Skin  normotropic skin with no porokeratosis noted bilaterally.  No signs of infections or ulcers noted.     Onychomycosis  Pain in right toes  Pain in left toes  Consent was obtained for treatment procedures.   Mechanical debridement of nails 1-5  bilaterally performed with a nail nipper.  Filed with dremel without incident.    Return office visit    3 months                 Told patient to return for periodic foot care and evaluation due to potential at risk complications.   Helane Gunther DPM

## 2020-08-20 ENCOUNTER — Other Ambulatory Visit: Payer: Self-pay

## 2020-11-21 ENCOUNTER — Ambulatory Visit: Payer: Medicare Other | Admitting: Podiatry

## 2021-01-30 ENCOUNTER — Other Ambulatory Visit: Payer: Self-pay

## 2021-01-30 ENCOUNTER — Ambulatory Visit (INDEPENDENT_AMBULATORY_CARE_PROVIDER_SITE_OTHER): Payer: Medicare Other | Admitting: Podiatry

## 2021-01-30 ENCOUNTER — Encounter (INDEPENDENT_AMBULATORY_CARE_PROVIDER_SITE_OTHER): Payer: Self-pay

## 2021-01-30 ENCOUNTER — Ambulatory Visit: Payer: Medicare Other | Admitting: Podiatry

## 2021-01-30 ENCOUNTER — Encounter: Payer: Self-pay | Admitting: Podiatry

## 2021-01-30 DIAGNOSIS — M79674 Pain in right toe(s): Secondary | ICD-10-CM

## 2021-01-30 DIAGNOSIS — M79675 Pain in left toe(s): Secondary | ICD-10-CM

## 2021-01-30 DIAGNOSIS — B351 Tinea unguium: Secondary | ICD-10-CM | POA: Diagnosis not present

## 2021-01-30 DIAGNOSIS — E669 Obesity, unspecified: Secondary | ICD-10-CM

## 2021-01-30 DIAGNOSIS — E1169 Type 2 diabetes mellitus with other specified complication: Secondary | ICD-10-CM

## 2021-01-30 NOTE — Progress Notes (Signed)
This patient returns to my office for at risk foot care.  This patient requires this care by a professional since this patient will be at risk due to having diabetes and ,venous stasis  This patient is unable to cut nails himself since the patient cannot reach his nails.These nails are painful walking and wearing shoes.  This patient presents for at risk foot care today.   General Appearance  Alert, conversant and in no acute stress.  Vascular  Dorsalis pedis and posterior tibial  pulses are palpable  bilaterally.  Capillary return is within normal limits  bilaterally. Temperature is within normal limits  bilaterally.  Neurologic  Senn-Weinstein monofilament wire test within normal limits  bilaterally. Muscle power within normal limits bilaterally.  Nails Thick disfigured discolored nails with subungual debris  from hallux to fifth toes bilaterally. No evidence of bacterial infection or drainage bilaterally.  Orthopedic  No limitations of motion  feet .  No crepitus or effusions noted.  No bony pathology or digital deformities noted.  Skin  normotropic skin with no porokeratosis noted bilaterally.  No signs of infections or ulcers noted.     Onychomycosis  Pain in right toes  Pain in left toes  Consent was obtained for treatment procedures.   Mechanical debridement of nails 1-5  bilaterally performed with a nail nipper.  Filed with dremel without incident.    Return office visit    3 months                 Told patient to return for periodic foot care and evaluation due to potential at risk complications.   Jalyssa Fleisher DPM  

## 2021-02-06 ENCOUNTER — Inpatient Hospital Stay
Admission: EM | Admit: 2021-02-06 | Discharge: 2021-02-14 | DRG: 194 | Disposition: A | Discharge status: Skilled Nursing Facility | Payer: Medicare Other | Attending: Internal Medicine | Admitting: Internal Medicine

## 2021-02-06 ENCOUNTER — Other Ambulatory Visit: Payer: Self-pay

## 2021-02-06 ENCOUNTER — Emergency Department: Payer: Medicare Other

## 2021-02-06 DIAGNOSIS — Z7984 Long term (current) use of oral hypoglycemic drugs: Secondary | ICD-10-CM | POA: Diagnosis not present

## 2021-02-06 DIAGNOSIS — I480 Paroxysmal atrial fibrillation: Secondary | ICD-10-CM | POA: Diagnosis present

## 2021-02-06 DIAGNOSIS — Z880 Allergy status to penicillin: Secondary | ICD-10-CM | POA: Diagnosis not present

## 2021-02-06 DIAGNOSIS — E1122 Type 2 diabetes mellitus with diabetic chronic kidney disease: Secondary | ICD-10-CM | POA: Diagnosis present

## 2021-02-06 DIAGNOSIS — Z720 Tobacco use: Secondary | ICD-10-CM | POA: Diagnosis not present

## 2021-02-06 DIAGNOSIS — N179 Acute kidney failure, unspecified: Secondary | ICD-10-CM | POA: Diagnosis present

## 2021-02-06 DIAGNOSIS — Z66 Do not resuscitate: Secondary | ICD-10-CM | POA: Diagnosis present

## 2021-02-06 DIAGNOSIS — R296 Repeated falls: Secondary | ICD-10-CM | POA: Diagnosis present

## 2021-02-06 DIAGNOSIS — E611 Iron deficiency: Secondary | ICD-10-CM | POA: Diagnosis present

## 2021-02-06 DIAGNOSIS — Z79899 Other long term (current) drug therapy: Secondary | ICD-10-CM | POA: Diagnosis not present

## 2021-02-06 DIAGNOSIS — J188 Other pneumonia, unspecified organism: Secondary | ICD-10-CM

## 2021-02-06 DIAGNOSIS — J440 Chronic obstructive pulmonary disease with acute lower respiratory infection: Secondary | ICD-10-CM | POA: Diagnosis present

## 2021-02-06 DIAGNOSIS — I10 Essential (primary) hypertension: Secondary | ICD-10-CM | POA: Diagnosis present

## 2021-02-06 DIAGNOSIS — J189 Pneumonia, unspecified organism: Secondary | ICD-10-CM | POA: Diagnosis present

## 2021-02-06 DIAGNOSIS — Z20822 Contact with and (suspected) exposure to covid-19: Secondary | ICD-10-CM | POA: Diagnosis present

## 2021-02-06 DIAGNOSIS — E876 Hypokalemia: Secondary | ICD-10-CM | POA: Diagnosis present

## 2021-02-06 DIAGNOSIS — E119 Type 2 diabetes mellitus without complications: Secondary | ICD-10-CM | POA: Diagnosis not present

## 2021-02-06 DIAGNOSIS — W19XXXA Unspecified fall, initial encounter: Secondary | ICD-10-CM | POA: Diagnosis not present

## 2021-02-06 DIAGNOSIS — Z7951 Long term (current) use of inhaled steroids: Secondary | ICD-10-CM

## 2021-02-06 DIAGNOSIS — I272 Pulmonary hypertension, unspecified: Secondary | ICD-10-CM | POA: Diagnosis present

## 2021-02-06 DIAGNOSIS — I130 Hypertensive heart and chronic kidney disease with heart failure and stage 1 through stage 4 chronic kidney disease, or unspecified chronic kidney disease: Secondary | ICD-10-CM | POA: Diagnosis present

## 2021-02-06 DIAGNOSIS — I509 Heart failure, unspecified: Secondary | ICD-10-CM

## 2021-02-06 DIAGNOSIS — R531 Weakness: Secondary | ICD-10-CM

## 2021-02-06 DIAGNOSIS — J449 Chronic obstructive pulmonary disease, unspecified: Secondary | ICD-10-CM | POA: Diagnosis present

## 2021-02-06 DIAGNOSIS — R7989 Other specified abnormal findings of blood chemistry: Secondary | ICD-10-CM

## 2021-02-06 DIAGNOSIS — Z7982 Long term (current) use of aspirin: Secondary | ICD-10-CM | POA: Diagnosis not present

## 2021-02-06 DIAGNOSIS — I4891 Unspecified atrial fibrillation: Secondary | ICD-10-CM

## 2021-02-06 DIAGNOSIS — I5032 Chronic diastolic (congestive) heart failure: Secondary | ICD-10-CM | POA: Diagnosis present

## 2021-02-06 DIAGNOSIS — Z888 Allergy status to other drugs, medicaments and biological substances status: Secondary | ICD-10-CM

## 2021-02-06 DIAGNOSIS — Z794 Long term (current) use of insulin: Secondary | ICD-10-CM

## 2021-02-06 DIAGNOSIS — Z87891 Personal history of nicotine dependence: Secondary | ICD-10-CM

## 2021-02-06 DIAGNOSIS — Z7901 Long term (current) use of anticoagulants: Secondary | ICD-10-CM

## 2021-02-06 DIAGNOSIS — N1831 Chronic kidney disease, stage 3a: Secondary | ICD-10-CM | POA: Diagnosis present

## 2021-02-06 DIAGNOSIS — R0902 Hypoxemia: Secondary | ICD-10-CM | POA: Diagnosis present

## 2021-02-06 LAB — BASIC METABOLIC PANEL
Anion gap: 12 (ref 5–15)
BUN: 70 mg/dL — ABNORMAL HIGH (ref 8–23)
CO2: 27 mmol/L (ref 22–32)
Calcium: 8.3 mg/dL — ABNORMAL LOW (ref 8.9–10.3)
Chloride: 100 mmol/L (ref 98–111)
Creatinine, Ser: 1.9 mg/dL — ABNORMAL HIGH (ref 0.61–1.24)
GFR, Estimated: 35 mL/min — ABNORMAL LOW (ref >60)
Glucose, Bld: 260 mg/dL — ABNORMAL HIGH (ref 70–99)
Potassium: 3.5 mmol/L (ref 3.5–5.1)
Sodium: 139 mmol/L (ref 135–145)

## 2021-02-06 LAB — CBC
HCT: 39.4 % (ref 39.0–52.0)
Hemoglobin: 13.7 g/dL (ref 13.0–17.0)
MCH: 32.5 pg (ref 26.0–34.0)
MCHC: 34.8 g/dL (ref 30.0–36.0)
MCV: 93.4 fL (ref 80.0–100.0)
Platelets: 159 10*3/uL (ref 150–400)
RBC: 4.22 MIL/uL (ref 4.22–5.81)
RDW: 13.7 % (ref 11.5–15.5)
WBC: 12.4 10*3/uL — ABNORMAL HIGH (ref 4.0–10.5)
nRBC: 0 % (ref 0.0–0.2)

## 2021-02-06 LAB — TROPONIN I (HIGH SENSITIVITY)
Troponin I (High Sensitivity): 27 ng/L — ABNORMAL HIGH (ref <18)
Troponin I (High Sensitivity): 24 ng/L — ABNORMAL HIGH (ref <18)

## 2021-02-06 LAB — RESP PANEL BY RT-PCR (FLU A&B, COVID) ARPGX2
Influenza A by PCR: NEGATIVE
Influenza B by PCR: NEGATIVE
SARS Coronavirus 2 by RT PCR: NEGATIVE

## 2021-02-06 LAB — LACTIC ACID, PLASMA
Lactic Acid, Venous: 1.9 mmol/L (ref 0.5–1.9)
Lactic Acid, Venous: 1.6 mmol/L (ref 0.5–1.9)

## 2021-02-06 LAB — CK: Total CK: 136 U/L (ref 49–397)

## 2021-02-06 LAB — TSH: TSH: 0.488 u[IU]/mL (ref 0.350–4.500)

## 2021-02-06 LAB — T4, FREE: Free T4: 1.3 ng/dL — ABNORMAL HIGH (ref 0.61–1.12)

## 2021-02-06 MED ORDER — LEVALBUTEROL TARTRATE 45 MCG/ACT IN AERO: 1.0000 | INHALATION_SPRAY | Freq: Three times a day (TID) | RESPIRATORY_TRACT | Status: DC | PRN

## 2021-02-06 MED ORDER — ASPIRIN EC 81 MG PO TBEC
81.0000 mg | DELAYED_RELEASE_TABLET | Freq: Every day | ORAL | Status: DC
  Administered 2021-02-07 – 2021-02-14 (×8): 81 mg via ORAL
  Filled 2021-02-06 (×8): qty 1

## 2021-02-06 MED ORDER — IPRATROPIUM-ALBUTEROL 0.5-2.5 (3) MG/3ML IN SOLN: 3.0000 mL | Freq: Four times a day (QID) | RESPIRATORY_TRACT | Status: DC | PRN

## 2021-02-06 MED ORDER — ONDANSETRON HCL 4 MG PO TABS: 4.0000 mg | ORAL_TABLET | Freq: Four times a day (QID) | ORAL | Status: DC | PRN

## 2021-02-06 MED ORDER — DOFETILIDE 125 MCG PO CAPS
125.0000 ug | ORAL_CAPSULE | ORAL | Status: DC
  Administered 2021-02-07 – 2021-02-10 (×4): 125 ug via ORAL
  Filled 2021-02-06 (×4): qty 1

## 2021-02-06 MED ORDER — ACETAMINOPHEN 325 MG PO TABS
650.0000 mg | ORAL_TABLET | Freq: Four times a day (QID) | ORAL | Status: DC | PRN
  Administered 2021-02-10 – 2021-02-12 (×2): 650 mg via ORAL
  Filled 2021-02-06 (×2): qty 2

## 2021-02-06 MED ORDER — AMIODARONE IV BOLUS ONLY 150 MG/100ML
150.0000 mg | Freq: Once | INTRAVENOUS | Status: AC
  Administered 2021-02-06: 150 mg via INTRAVENOUS
  Filled 2021-02-06: qty 100

## 2021-02-06 MED ORDER — ASPIRIN 81 MG PO CHEW: 324.0000 mg | CHEWABLE_TABLET | Freq: Once | ORAL | Status: DC

## 2021-02-06 MED ORDER — SODIUM CHLORIDE 0.9 % IV SOLN
500.0000 mg | Freq: Once | INTRAVENOUS | Status: AC
  Administered 2021-02-06: 500 mg via INTRAVENOUS
  Filled 2021-02-06: qty 500

## 2021-02-06 MED ORDER — ATORVASTATIN CALCIUM 20 MG PO TABS
20.0000 mg | ORAL_TABLET | Freq: Every day | ORAL | Status: DC
  Administered 2021-02-07 – 2021-02-13 (×7): 20 mg via ORAL
  Filled 2021-02-06 (×7): qty 1

## 2021-02-06 MED ORDER — DILTIAZEM LOAD VIA INFUSION
10.0000 mg | Freq: Once | INTRAVENOUS | Status: AC
  Administered 2021-02-06: 10 mg via INTRAVENOUS
  Filled 2021-02-06: qty 10

## 2021-02-06 MED ORDER — ALBUTEROL SULFATE (2.5 MG/3ML) 0.083% IN NEBU: 2.5000 mg | INHALATION_SOLUTION | RESPIRATORY_TRACT | Status: DC | PRN

## 2021-02-06 MED ORDER — ACETAMINOPHEN 650 MG RE SUPP
650.0000 mg | Freq: Four times a day (QID) | RECTAL | Status: DC | PRN
  Filled 2021-02-06: qty 1

## 2021-02-06 MED ORDER — SODIUM CHLORIDE 0.9 % IV SOLN: 1.0000 g | Freq: Once | INTRAVENOUS | Status: DC

## 2021-02-06 MED ORDER — DILTIAZEM HCL 25 MG/5ML IV SOLN
10.0000 mg | Freq: Once | INTRAVENOUS | Status: AC
  Administered 2021-02-06: 10 mg via INTRAVENOUS
  Filled 2021-02-06: qty 5

## 2021-02-06 MED ORDER — BUDESONIDE 0.5 MG/2ML IN SUSP
0.5000 mg | Freq: Every day | RESPIRATORY_TRACT | Status: DC
  Administered 2021-02-07 – 2021-02-14 (×8): 0.5 mg via RESPIRATORY_TRACT
  Filled 2021-02-06 (×8): qty 2

## 2021-02-06 MED ORDER — HYDRALAZINE HCL 20 MG/ML IJ SOLN: 5.0000 mg | INTRAMUSCULAR | Status: DC | PRN

## 2021-02-06 MED ORDER — BISACODYL 5 MG PO TBEC: 5.0000 mg | DELAYED_RELEASE_TABLET | Freq: Every day | ORAL | Status: DC | PRN

## 2021-02-06 MED ORDER — DILTIAZEM HCL-DEXTROSE 125-5 MG/125ML-% IV SOLN (PREMIX)
5.0000 mg/h | INTRAVENOUS | Status: DC
  Administered 2021-02-06: 5 mg/h via INTRAVENOUS
  Filled 2021-02-06 (×2): qty 125

## 2021-02-06 MED ORDER — LACTATED RINGERS IV SOLN: INTRAVENOUS | Status: DC

## 2021-02-06 MED ORDER — POLYETHYLENE GLYCOL 3350 17 G PO PACK: 17.0000 g | PACK | Freq: Every day | ORAL | Status: DC | PRN

## 2021-02-06 MED ORDER — TIOTROPIUM BROMIDE MONOHYDRATE 18 MCG IN CAPS
18.0000 ug | ORAL_CAPSULE | Freq: Every day | RESPIRATORY_TRACT | Status: DC
  Administered 2021-02-07 – 2021-02-14 (×8): 18 ug via RESPIRATORY_TRACT
  Filled 2021-02-06 (×2): qty 5

## 2021-02-06 MED ORDER — GABAPENTIN 300 MG PO CAPS
300.0000 mg | ORAL_CAPSULE | Freq: Three times a day (TID) | ORAL | Status: DC
  Administered 2021-02-07: 300 mg via ORAL
  Filled 2021-02-06: qty 1

## 2021-02-06 MED ORDER — INSULIN ASPART 100 UNIT/ML IJ SOLN
0.0000 [IU] | Freq: Three times a day (TID) | INTRAMUSCULAR | Status: DC
  Administered 2021-02-07: 3 [IU] via SUBCUTANEOUS
  Administered 2021-02-07: 5 [IU] via SUBCUTANEOUS
  Administered 2021-02-07 – 2021-02-08 (×4): 7 [IU] via SUBCUTANEOUS
  Administered 2021-02-09: 5 [IU] via SUBCUTANEOUS
  Administered 2021-02-09: 7 [IU] via SUBCUTANEOUS
  Administered 2021-02-09: 5 [IU] via SUBCUTANEOUS
  Administered 2021-02-10: 7 [IU] via SUBCUTANEOUS
  Administered 2021-02-10: 5 [IU] via SUBCUTANEOUS
  Administered 2021-02-10: 7 [IU] via SUBCUTANEOUS
  Administered 2021-02-11: 3 [IU] via SUBCUTANEOUS
  Administered 2021-02-11: 5 [IU] via SUBCUTANEOUS
  Administered 2021-02-11: 7 [IU] via SUBCUTANEOUS
  Filled 2021-02-06 (×16): qty 1

## 2021-02-06 MED ORDER — ONDANSETRON HCL 4 MG/2ML IJ SOLN: 4.0000 mg | Freq: Four times a day (QID) | INTRAMUSCULAR | Status: DC | PRN

## 2021-02-06 MED ORDER — SODIUM CHLORIDE 0.9 % IV SOLN
1.0000 g | Freq: Once | INTRAVENOUS | Status: AC
  Administered 2021-02-06: 1 g via INTRAVENOUS
  Filled 2021-02-06: qty 10

## 2021-02-06 MED ORDER — LACTATED RINGERS IV BOLUS
1000.0000 mL | Freq: Once | INTRAVENOUS | Status: AC
  Administered 2021-02-06: 1000 mL via INTRAVENOUS

## 2021-02-06 MED ORDER — SODIUM CHLORIDE 0.9% FLUSH
3.0000 mL | Freq: Two times a day (BID) | INTRAVENOUS | Status: DC
  Administered 2021-02-06 – 2021-02-13 (×14): 3 mL via INTRAVENOUS

## 2021-02-06 NOTE — ED Provider Notes (Signed)
[facility] Emergency Department Provider Note   ____________________________________________   I have reviewed the triage vital signs and the nursing notes.   HISTORY  Chief Complaint Weakness   History limited by: Not Limited   HPI Samuel Thomas is a 85 y.o. male who presents to the emergency department today after suffering a fall secondary to weakness.  Patient states he was getting up to use the restroom when he felt his knees buckle under him.  He denies any significant traumatic injury from the fall.  However was unable to get up off the ground after that.  Patient denies any chest pain or palpitations.  When EMS arrived they noted patient to be in A. fib with some runs of wide-complex.  Patient does have history of A. fib and is on blood thinning medication.  He denies any recent illness.  Records reviewed. Per medical record review patient has a history of COPD, DM2, HLD, HTN.  Past Medical History:  Diagnosis Date   COPD (chronic obstructive pulmonary disease) (HCC)    DM2 (diabetes mellitus, type 2) (HCC)    HLD (hyperlipidemia)    HTN (hypertension)     Patient Active Problem List   Diagnosis Date Noted   Pain due to onychomycosis of toenails of both feet 05/20/2020   History of gout 04/02/2020   Acute encephalopathy 10/27/2017   Syncope and collapse 10/27/2017   Incomplete emptying of bladder 07/20/2017   Overactive detrusor 07/20/2017   Atrial fibrillation (HCC) 07/01/2017   Hearing loss, sensorineural, asymmetrical 03/09/2017   At risk for falls 03/24/2016   Polypharmacy 03/24/2016   Benign fibroma of prostate 09/10/2015   Diastolic heart failure (HCC) 08/16/2015   Pulmonary hypertension (HCC) 08/16/2015   Cough 08/15/2015   Breath shortness 08/15/2015   BP (high blood pressure) 07/24/2015   BPH with obstruction/lower urinary tract symptoms 07/02/2015   Diverticulitis large intestine 01/03/2015   Abdominal pain 01/02/2015    Injury of kidney 01/02/2015   N&V (nausea and vomiting) 01/02/2015   Chronic pain associated with significant psychosocial dysfunction 10/04/2014   Chronic pain syndrome 10/04/2014   Encounter for monitoring opioid maintenance therapy 08/29/2014   Arthritis 07/24/2014   Venous stasis ulcer of lower extremity (HCC) 07/24/2014   Chronic pain of right knee 07/03/2014   Diabetes mellitus (HCC) 05/17/2014   Obstructive apnea 04/25/2014   Drug-induced constipation 04/03/2014   Arthritis of knee, degenerative 01/30/2014   Bacterial skin infection of leg 01/16/2014   Chronic diastolic heart failure (HCC) 01/16/2014   Edema leg 01/16/2014   Cellulitis of lower extremity 01/16/2014   Chronic combined systolic and diastolic heart failure (HCC) 01/02/2014   Acute diastolic heart failure (HCC) 12/02/2013   NSVT (nonsustained ventricular tachycardia) 12/01/2013   SIRS (systemic inflammatory response syndrome) (HCC) 12/01/2013   Altered mental status 12/01/2013   Hypokalemia 12/01/2013   DM2 (diabetes mellitus, type 2) (HCC) 12/01/2013   HTN (hypertension) 12/01/2013   Hypoxia 12/01/2013   Chronic, continuous use of opioids 09/18/2013   Pain in shoulder 07/21/2013   Pain of right hand 05/24/2013   Bilateral hand pain 05/24/2013   Other long term (current) drug therapy 01/03/2013   High risk medication use 01/03/2013   Muscle ache 11/23/2012   Suicidal ideation 11/23/2012   Cellulitis 09/25/2012   COPD (chronic obstructive pulmonary disease) (HCC) 09/25/2012   Apnea, sleep 09/25/2012   Diabetes mellitus type 2 in obese (HCC) 08/15/2012   Hook nail 08/15/2012   Fungal infection of nail  08/15/2012   Arthropathy of lumbar facet joint 07/08/2012   Nerve root pain 07/07/2012   Cannabis abuse 07/07/2012   Marijuana use 07/07/2012   Status post cataract extraction 06/16/2012   Artificial lens present 06/16/2012   Other specified health status 05/10/2012   Depression 05/10/2012   Alcohol use  05/10/2012   Lumbar canal stenosis 03/17/2012   Degenerative arthritis of hip 03/17/2012   Adiposity 03/17/2012   H/O arthrodesis 03/17/2012   S/P cervical spinal fusion 03/17/2012   Arthralgia of hip or thigh 01/12/2012   Lumbosacral spondylosis 12/10/2011   Pain in thoracic spine 12/10/2011   Brachial neuritis 10/27/2011   Hypercholesteremia 08/25/2011   Hypercholesterolemia 08/25/2011   Cervical spinal stenosis 07/27/2011   Cervical spondylosis with myelopathy 07/17/2011   Difficulty walking 07/13/2011   Thoracic and lumbosacral neuritis 07/13/2011   Diverticulosis of colon 05/25/2011   History of colonic polyps 05/25/2011   Diverticular disease of large intestine 05/25/2011   Astigmatism 04/10/2011   Cataract, nuclear sclerotic senile 04/10/2011   Diabetes mellitus type 2, uncontrolled 04/10/2011   Bilateral low back pain without sciatica 03/31/2011   Allergic rhinitis 06/20/2002   Asthma 06/20/2002   Moderate persistent asthma without complication 06/20/2002   Coronary atherosclerosis 06/17/2000   Essential hypertension 06/17/2000    No past surgical history on file.  Prior to Admission medications   Medication Sig Start Date End Date Taking? Authorizing Provider  albuterol (VENTOLIN HFA) 108 (90 Base) MCG/ACT inhaler Ventolin HFA 90 mcg/actuation aerosol inhaler  Inhale 2 puffs by mouth every 6 hours as needed for wheezing 10/06/19   [provider]  apixaban (ELIQUIS) 5 MG TABS tablet Eliquis 5 mg tablet  Take 1 tablet by mouth twice daily    [provider]  aspirin 81 MG EC tablet aspirin 81 mg tablet,delayed release  Take 1 tablet every day by oral route.    [provider]  atorvastatin (LIPITOR) 20 MG tablet Take 20 mg by mouth at bedtime.    [provider]  baclofen (LIORESAL) 10 MG tablet Take 10 mg by mouth as directed.    [provider]  beclomethasone (QVAR) 40 MCG/ACT inhaler Inhale 1-2 puffs into the lungs 2  (two) times daily.    [provider]  budesonide (PULMICORT) 0.5 MG/2ML nebulizer solution Inhale into the lungs. 02/07/15   [provider]  buPROPion (WELLBUTRIN XL) 150 MG 24 hr tablet  12/15/13   [provider]  Calcium Carb-Cholecalciferol 600-400 MG-UNIT TABS Take 1 tablet by mouth daily.    [provider]  cetirizine (ZYRTEC) 10 MG tablet cetirizine 10 mg tablet  Take 1 tablet every day by oral route.    [provider]  colchicine 0.6 MG tablet Take by mouth. 06/13/20 06/13/21  [provider]  dofetilide (TIKOSYN) 125 MCG capsule Take by mouth. 11/29/17   [provider]  DULoxetine (CYMBALTA) 60 MG capsule duloxetine 60 mg capsule,delayed release  Take 1 capsule by mouth once daily    [provider]  finasteride (PROSCAR) 5 MG tablet  10/31/18   [provider]  FLUoxetine (PROZAC) 20 MG capsule Take 20 mg by mouth daily.    [provider]  fluticasone (FLONASE) 50 MCG/ACT nasal spray Place 1-2 sprays into both nostrils daily.    [provider]  furosemide (LASIX) 40 MG tablet Take 1 tablet (40 mg total) by mouth daily. 12/07/13   Zannie Cove, MD  gabapentin (NEURONTIN) 600 MG tablet Take 600 mg  by mouth 3 (three) times daily.    [provider]  HUMALOG 100 UNIT/ML injection SMARTSIG:6 Unit(s) SUB-Q 3 Times Daily 06/06/20   [provider]  hydrochlorothiazide (HYDRODIURIL) 25 MG tablet  02/05/14   [provider]  HYDROcodone-acetaminophen (NORCO/VICODIN) 5-325 MG per tablet Take 1 tablet by mouth every 6 (six) hours as needed for moderate pain.    [provider]  icosapent Ethyl (VASCEPA) 1 g capsule  06/07/17   [provider]  insulin aspart (NOVOLOG) 100 UNIT/ML injection Inject 4 Units into the skin as directed.    [provider]  insulin glargine (LANTUS) 100 UNIT/ML injection Inject 25-30 Units into the skin 2 (two) times  daily.    [provider]  ipratropium-albuterol (DUONEB) 0.5-2.5 (3) MG/3ML SOLN Inhale into the lungs. 04/16/19 11/10/20  [provider]  Lactobacillus Acid-Pectin (ACIDOPHILUS/PECTIN) CAPS Acidophilus Probiotic 100 million cell-10 mg capsule  Take 1 capsule every day by oral route.    [provider]  levalbuterol Pauline Aus HFA) 45 MCG/ACT inhaler Inhale into the lungs every 4 (four) hours as needed for wheezing.    [provider]  lidocaine (LIDODERM) 5 % lidocaine 5 % topical patch 04/11/20   [provider]  lisinopril (ZESTRIL) 5 MG tablet lisinopril 5 mg tablet  Take 1 tablet by mouth once daily 10/26/16   [provider]  loratadine (CLARITIN) 10 MG tablet Take 10 mg by mouth daily.    [provider]  losartan (COZAAR) 100 MG tablet Take 100 mg by mouth daily.    [provider]  metFORMIN (GLUCOPHAGE-XR) 500 MG 24 hr tablet Take 1,000 mg by mouth 2 (two) times daily.    [provider]  metoprolol succinate (TOPROL-XL) 25 MG 24 hr tablet Take 1 tablet by mouth daily. 08/09/17 10/05/20  [provider]  Multiple Vitamin (ONE DAILY) tablet Take 1 tablet by mouth daily.    [provider]  naloxone [facility]) nasal spray 4 mg/0.1 mL Narcan 4 mg/actuation nasal spray 04/11/20   [provider]  NIFEdipine (PROCARDIA XL/ADALAT-CC) 90 MG 24 hr tablet Take 90 mg by mouth daily.    [provider]  omeprazole (PRILOSEC) 20 MG capsule Take 1 capsule by mouth daily. 06/26/20 06/26/21  [provider]  ondansetron (ZOFRAN) 4 MG tablet ondansetron HCl 4 mg tablet 05/15/19   [provider]  potassium chloride 20 MEQ TBCR Take 20 mEq by mouth daily. 12/07/13   Zannie Cove, MD  potassium chloride SA (K-DUR,KLOR-CON) 20 MEQ tablet  02/02/14   [provider]  Probiotic Product ([employer] PROBIOTIC) CHEW Chew by mouth.    [provider]  rivaroxaban (XARELTO) 20  MG TABS tablet Take by mouth. 08/15/20   [provider]  senna-docusate (SENOKOT-S) 8.6-50 MG tablet Senna with Docusate Sodium 8.6 mg-50 mg tablet  Take 2 tablets twice a day by oral route.    [provider]  tamsulosin (FLOMAX) 0.4 MG CAPS capsule Take 0.4 mg by mouth daily.    [provider]  tiotropium (SPIRIVA HANDIHALER) 18 MCG inhalation capsule Spiriva with HandiHaler 18 mcg and inhalation capsules    [provider]  TRUETRACK TEST test strip  02/21/14   [provider]  TUDORZA PRESSAIR 400 MCG/ACT AEPB  02/21/14   [provider]  umeclidinium bromide (INCRUSE ELLIPTA) 62.5 MCG/INH AEPB Inhale into the lungs. 02/20/19   [provider]  VOLTAREN 1 % GEL  01/31/14   [provider]    Allergies Penicillins, Apixaban, and Prednisone  No family history on file.  Social History Social History   Tobacco Use   Smoking status: Former    Types: Cigarettes    Quit date: 12/01/1973    Years since quitting: 47.2   Smokeless tobacco: Never  Substance Use Topics   Alcohol use: No    Alcohol/week: 0.0 standard drinks   Drug use: No    Review of Systems Constitutional: No fever/chills. Positive for weakness. Eyes: No visual changes. ENT: No sore throat. Cardiovascular: Denies chest pain. Respiratory: Denies shortness of breath. Gastrointestinal: No abdominal pain.  No nausea, no vomiting.  No diarrhea.   Genitourinary: Negative for dysuria. Musculoskeletal: Negative for back pain. Skin: Negative for rash. Neurological: Negative for headaches, focal weakness or numbness.  ____________________________________________   PHYSICAL EXAM:  VITAL SIGNS: ED Triage Vitals  Enc Vitals Group     BP 138/106     Pulse 125     Resp 20     Temp 98.3     Temp src      SpO2 98   Constitutional: Alert and oriented.  Eyes: Conjunctivae are normal.  ENT      Head: Normocephalic and atraumatic.      Nose: No  congestion/rhinnorhea.      Mouth/Throat: Mucous membranes are moist.      Neck: No stridor. Hematological/Lymphatic/Immunilogical: No cervical lymphadenopathy. Cardiovascular: Tachycardic, irregular rhythm.  No murmurs, rubs, or gallops.  Respiratory: Normal respiratory effort without tachypnea nor retractions. Breath sounds are clear and equal bilaterally. No wheezes/rales/rhonchi. Gastrointestinal: Soft and non tender. No rebound. No guarding.  Genitourinary: Deferred Musculoskeletal: Normal range of motion in all extremities. No lower extremity edema. Neurologic:  Normal speech and language. No gross focal neurologic deficits are appreciated.  Skin:  Skin is warm, dry and intact. No rash noted. Psychiatric: Mood and affect are normal. Speech and behavior are normal. Patient exhibits appropriate insight and judgment.  ____________________________________________    LABS (pertinent positives/negatives)  Lactic acid 1.9 Trop hs 27 CBC wbc 12.4, hgb 13.7, plt 159 CK 136 BMP wnl except glu 260, BUN 70, cr 1.90 ____________________________________________   EKG  I, Phineas Semen, attending physician, personally viewed and interpreted this EKG  EKG Time: 1724 Rate: 122 Rhythm: atrial fibrillation with frequent PVC Axis: normal Intervals: qtc 486 QRS: narrow, q waves V1, V2 ST changes: no st elevation Impression: abnormal ekg  ____________________________________________    RADIOLOGY  CT head No acute abnormality  CXR Pneumonia in right lower lobe  ____________________________________________   PROCEDURES  Procedures  ____________________________________________   INITIAL IMPRESSION / ASSESSMENT AND PLAN / ED COURSE  Pertinent labs & imaging results that were available during my care of the patient were reviewed by me and considered in my medical decision making (see chart for details).   Patient presented to the emergency department today because of  concerns for weakness.  Patient was found to be in A. fib with RVR.  Did have frequent PVCs.  Patient was initially given amiodarone and then diltiazem to help control heart rate.  Work-up was also notable for slight leukocytosis and chest x-ray concerning for pneumonia.  I do wonder if this is what is contributing to his weakness and increased heart rate.  Will start antibiotics.  Discussed findings and plan with patient.  ____________________________________________   FINAL CLINICAL IMPRESSION(S) / ED DIAGNOSES  Final diagnoses:  Pneumonia due to infectious organism, unspecified laterality, unspecified part of lung  Note: This dictation was prepared with Dragon dictation. Any transcriptional errors that result from this process are unintentional     Phineas Semen, MD 02/06/21 1943

## 2021-02-06 NOTE — H&P (Signed)
History and Physical    Samuel Thomas HQP:[id number] DOB: 1935-04-29 DOA: 02/06/2021  PCP: System, Provider Not In    Patient coming from:  [facility] [place].   Chief Complaint:  Fall    HPI: Samuel Thomas is a 85 y.o. male seen in ed with complaints of fall. PT had a fall today between 3-5 pm, and then his neighbor called his nephew and he called ems.  Pt is alert,awake and oriented but is his baseline.  Pt states before hsi fall he was dizzy, and weak and his legs felt like water. It has happened before four days ago when he fell and was dizzy.  Pt denies any eyes ears, nose or speech or gait issues. Pt states he is weak in his legs.  At home has not been abel to do ADL. Daughter at bedside pt has been declining for past few weeks.  Pt has h/o c/h a/fib for 20 year, and takes [place]. Pt also takes blood thinner but does not know name, then pt states he take eliquis and xarelto both. Once med rec is done we can cont with the correct medication.    Pt has past medical history of COPD. dmII, Htn,Hearing loss, pulmonary HTN,  ED Course:  Vitals:   02/06/21 2256 02/06/21 2300 02/06/21 2315 02/06/21 2330  BP: (!) 159/70 (!) 142/93 (!) 157/80 (!) 157/91  Pulse: (!) 101 (!) 109 (!) 104 73  Resp: (!) 30 (!) 26 (!) 28 (!) 28  Temp:      TempSrc:      SpO2: 96% 94% 94% 93%  Weight:      Height:      In ed pt is hard of hearing, and  is in a.fib rvr with hr going from 120-130's.  Labs shows glucose of 260 and creatinine of 1.90 which is acute. Cbc shows wbc of 12.4 and normal hemoglobin of 13.7,platelet of 159.head ct shows No CT evidence for acute intracranial abnormality and  Atrophy and chronic small vessel ischemic changes of the white Matter.chest xray shows Right lower lobe pneumonia. Followup PA and lateral chest X-ray is recommended in 3-4 weeks following trial of antibiotic therapy to ensure resolution and exclude underlying malignancy. Pt got rocephin and  azithromycin,and amiodarone and diltiazem for his a.fib rvr.   Review of Systems:  Review of Systems  Constitutional:  Positive for malaise/fatigue.  Musculoskeletal:  Positive for falls.  Neurological:  Positive for weakness.  All other systems reviewed and are negative.   Past Medical History:  Diagnosis Date   COPD (chronic obstructive pulmonary disease) (HCC)    DM2 (diabetes mellitus, type 2) (HCC)    HLD (hyperlipidemia)    HTN (hypertension)    Past Surgical History:  Procedure Laterality Date   HERNIA REPAIR      reports that he quit smoking about 47 years ago. His smoking use included cigarettes. He has never used smokeless tobacco. He reports that he does not drink alcohol and does not use drugs.  Allergies  Allergen Reactions   Penicillins Rash and Swelling    Break outs swelling Other reaction(s): SWELLING in 1955 Break outs swelling Other reaction(s): SWELLING in 1955 Break outs swelling Other reaction(s): SWELLING in 1955 Break outs swelling Break outs swelling Other reaction(s): SWELLING in 1955 Break outs swelling Other reaction(s): SWELLING in 1955 Break outs swelling Break outs swelling Other reaction(s): SWELLING in 1955 Break outs swelling Other reaction(s): SWELLING in 1955 Break outs swelling Other reaction(s): SWELLING  in 1955 Break outs swelling Break outs swelling Other reaction(s): SWELLING in 1955  Break outs swelling Other reaction(s): SWELLING in 1955 Break outs swelling Other reaction(s): SWELLING in 1955 Break outs swelling Other reaction(s): SWELLING in 1955 Break outs swelling Break outs swelling Other reaction(s): SWELLING in 1955 Break outs swelling Other reaction(s): SWELLING in 1955 Break outs swelling Break outs swelling Other reaction(s): SWELLING in 1955    Apixaban Other (See Comments)    Cold intolerance Cold intolerance    Prednisone Other (See Comments) and Rash    Makes mean  Significant mood and  behavioral changes - 'felt like I wasn't in control of anything' Makes mean  Significant mood and behavioral changes - 'felt like I wasn't in control of anything' Makes mean  Significant mood and behavioral changes - 'felt like I wasn't in control of anything' Significant mood and behavioral changes - 'felt like I wasn't in control of anything' Makes mean  Significant mood and behavioral changes - 'felt like I wasn't in control of anything' Makes mean  Significant mood and behavioral changes - 'felt like I wasn't in control of anything' Makes mean  Significant mood and behavioral changes - 'felt like I wasn't in control of anything' Significant mood and behavioral changes - 'felt like I wasn't in control of anything' Makes mean  Makes mean  Significant mood and behavioral changes - 'felt like I wasn't in control of anything' Makes mean  Significant mood and behavioral changes - 'felt like I wasn't in control of anything' Makes mean  Significant mood and behavioral changes - 'felt like I wasn't in control of anything' Significant mood and behavioral changes - 'felt like I wasn't in control of anything' Makes mean  Significant mood and behavioral changes - 'felt like I wasn't in control of anything' Makes mean  Significant mood and behavioral changes - 'felt like I wasn't in control of anything'  Makes mean  Significant mood and behavioral changes - 'felt like I wasn't in control of anything' Makes mean  Significant mood and behavioral changes - 'felt like I wasn't in control of anything' Makes mean  Significant mood and behavioral changes - 'felt like I wasn't in control of anything' Significant mood and behavioral changes - 'felt like I wasn't in control of anything' Makes mean  Significant mood and behavioral changes - 'felt like I wasn't in control of anything' Makes mean  Significant mood and behavioral changes - 'felt like I wasn't in control of anything' Makes mean   Significant mood and behavioral changes - 'felt like I wasn't in control of anything' Significant mood and behavioral changes - 'felt like I wasn't in control of anything' Makes mean  Makes mean  Significant mood and behavioral changes - 'felt like I wasn't in control of anything'     History reviewed. No pertinent family history.  Prior to Admission medications   Medication Sig Start Date End Date Taking? Authorizing Provider  albuterol (VENTOLIN HFA) 108 (90 Base) MCG/ACT inhaler Ventolin HFA 90 mcg/actuation aerosol inhaler  Inhale 2 puffs by mouth every 6 hours as needed for wheezing 10/06/19   [provider]  apixaban (ELIQUIS) 5 MG TABS tablet Eliquis 5 mg tablet  Take 1 tablet by mouth twice daily    [provider]  aspirin 81 MG EC tablet aspirin 81 mg tablet,delayed release  Take 1 tablet every day by oral route.    [provider]  atorvastatin (LIPITOR) 20 MG tablet Take 20 mg by  mouth at bedtime.    [provider]  baclofen (LIORESAL) 10 MG tablet Take 10 mg by mouth as directed.    [provider]  beclomethasone (QVAR) 40 MCG/ACT inhaler Inhale 1-2 puffs into the lungs 2 (two) times daily.    [provider]  budesonide (PULMICORT) 0.5 MG/2ML nebulizer solution Inhale into the lungs. 02/07/15   [provider]  buPROPion (WELLBUTRIN XL) 150 MG 24 hr tablet  12/15/13   [provider]  Calcium Carb-Cholecalciferol 600-400 MG-UNIT TABS Take 1 tablet by mouth daily.    [provider]  cetirizine (ZYRTEC) 10 MG tablet cetirizine 10 mg tablet  Take 1 tablet every day by oral route.    [provider]  colchicine 0.6 MG tablet Take by mouth. 06/13/20 06/13/21  [provider]  dofetilide (TIKOSYN) 125 MCG capsule Take by mouth. 11/29/17   [provider]  DULoxetine (CYMBALTA) 60 MG capsule duloxetine 60 mg capsule,delayed release  Take 1 capsule by mouth once daily     [provider]  finasteride (PROSCAR) 5 MG tablet  10/31/18   [provider]  FLUoxetine (PROZAC) 20 MG capsule Take 20 mg by mouth daily.    [provider]  fluticasone (FLONASE) 50 MCG/ACT nasal spray Place 1-2 sprays into both nostrils daily.    [provider]  furosemide (LASIX) 40 MG tablet Take 1 tablet (40 mg total) by mouth daily. 12/07/13   Zannie Cove, MD  gabapentin (NEURONTIN) 600 MG tablet Take 600 mg by mouth 3 (three) times daily.    [provider]  HUMALOG 100 UNIT/ML injection SMARTSIG:6 Unit(s) SUB-Q 3 Times Daily 06/06/20   [provider]  hydrochlorothiazide (HYDRODIURIL) 25 MG tablet  02/05/14   [provider]  HYDROcodone-acetaminophen (NORCO/VICODIN) 5-325 MG per tablet Take 1 tablet by mouth every 6 (six) hours as needed for moderate pain.    [provider]  icosapent Ethyl (VASCEPA) 1 g capsule  06/07/17   [provider]  insulin aspart (NOVOLOG) 100 UNIT/ML injection Inject 4 Units into the skin as directed.    [provider]  insulin glargine (LANTUS) 100 UNIT/ML injection Inject 25-30 Units into the skin 2 (two) times daily.    [provider]  ipratropium-albuterol (DUONEB) 0.5-2.5 (3) MG/3ML SOLN Inhale into the lungs. 04/16/19 11/10/20  [provider]  Lactobacillus Acid-Pectin (ACIDOPHILUS/PECTIN) CAPS Acidophilus Probiotic 100 million cell-10 mg capsule  Take 1 capsule every day by oral route.    [provider]  levalbuterol Pauline Aus HFA) 45 MCG/ACT inhaler Inhale into the lungs every 4 (four) hours as needed for wheezing.    [provider]  lidocaine (LIDODERM) 5 % lidocaine 5 % topical patch 04/11/20   [provider]  lisinopril (ZESTRIL) 5 MG tablet lisinopril 5 mg tablet  Take 1 tablet by mouth once daily 10/26/16   [provider]  loratadine (CLARITIN) 10 MG tablet Take 10 mg by mouth daily.    [provider]  losartan (COZAAR) 100 MG tablet Take 100 mg by mouth daily.    [provider]  metFORMIN (GLUCOPHAGE-XR) 500 MG 24 hr tablet Take 1,000 mg by mouth 2 (two) times daily.    [provider]  metoprolol succinate (TOPROL-XL) 25 MG 24 hr tablet Take 1 tablet by mouth daily. 08/09/17 10/05/20  [provider]  Multiple Vitamin (ONE DAILY) tablet Take 1 tablet by mouth daily.    [provider]  naloxone Jonelle Sports)  nasal spray 4 mg/0.1 mL Narcan 4 mg/actuation nasal spray 04/11/20   [provider]  NIFEdipine (PROCARDIA XL/ADALAT-CC) 90 MG 24 hr tablet Take 90 mg by mouth daily.    [provider]  omeprazole (PRILOSEC) 20 MG capsule Take 1 capsule by mouth daily. 06/26/20 06/26/21  [provider]  ondansetron (ZOFRAN) 4 MG tablet ondansetron HCl 4 mg tablet 05/15/19   [provider]  potassium chloride 20 MEQ TBCR Take 20 mEq by mouth daily. 12/07/13   Zannie Cove, MD  potassium chloride SA (K-DUR,KLOR-CON) 20 MEQ tablet  02/02/14   [provider]  Probiotic Product ([employer] PROBIOTIC) CHEW Chew by mouth.    [provider]  rivaroxaban (XARELTO) 20 MG TABS tablet Take by mouth. 08/15/20   [provider]  senna-docusate (SENOKOT-S) 8.6-50 MG tablet Senna with Docusate Sodium 8.6 mg-50 mg tablet  Take 2 tablets twice a day by oral route.    [provider]  tamsulosin (FLOMAX) 0.4 MG CAPS capsule Take 0.4 mg by mouth daily.    [provider]  tiotropium (SPIRIVA HANDIHALER) 18 MCG inhalation capsule Spiriva with HandiHaler 18 mcg and inhalation capsules    [provider]  TRUETRACK TEST test strip  02/21/14   [provider]  TUDORZA PRESSAIR 400 MCG/ACT AEPB  02/21/14   [provider]  umeclidinium bromide (INCRUSE ELLIPTA) 62.5 MCG/INH AEPB Inhale into the lungs. 02/20/19   [provider]  VOLTAREN 1 % GEL  01/31/14   [provider]    Physical Exam: Vitals:   02/06/21 2256 02/06/21 2300 02/06/21 2315 02/06/21 2330  BP: (!) 159/70 (!) 142/93 (!) 157/80 (!) 157/91  Pulse: (!) 101 (!) 109 (!) 104 73  Resp: (!) 30 (!) 26 (!) 28 (!) 28  Temp:      TempSrc:      SpO2: 96% 94% 94% 93%  Weight:      Height:       Physical Exam Vitals and nursing note reviewed.  Constitutional:      General: He is not in acute distress.    Appearance: He is not ill-appearing, toxic-appearing or diaphoretic.  HENT:     Head: Normocephalic and atraumatic.     Right Ear: External ear normal.     Left Ear: External ear normal.     Nose: Nose normal.     Mouth/Throat:     Mouth: Mucous membranes are dry.  Eyes:     Extraocular Movements: Extraocular movements intact.     Pupils: Pupils are equal, round, and reactive to light.  Neck:     Vascular: No carotid bruit.  Cardiovascular:     Rate and Rhythm: Tachycardia present. Rhythm irregular.     Pulses: Normal pulses.     Heart sounds: Normal heart sounds.  Pulmonary:     Effort: Pulmonary effort is normal.     Breath sounds: Normal breath sounds.  Abdominal:     General: Bowel sounds are normal. There is no distension.     Palpations: Abdomen is soft. There is no mass.     Tenderness: There is no abdominal tenderness. There is no guarding.     Hernia: No hernia is present.  Musculoskeletal:        General: No swelling or deformity.     Right lower leg: No edema.     Left lower leg: No edema.  Skin:    General: Skin is warm.  Neurological:  General: No focal deficit present.     Mental Status: He is alert and oriented to person, place, and time.  Psychiatric:        Mood and Affect: Mood normal.        Behavior: Behavior normal.    Labs on Admission: I have personally reviewed following labs and imaging studies  Recent Labs    02/06/21 1726  CKTOTAL 136   Lab Results  Component Value Date   WBC 12.4 (H) 02/06/2021   HGB 13.7 02/06/2021    HCT 39.4 02/06/2021   MCV 93.4 02/06/2021   PLT 159 02/06/2021    Recent Labs  Lab 02/06/21 1726  NA 139  K 3.5  CL 100  CO2 27  BUN 70*  CREATININE 1.90*  CALCIUM 8.3*  GLUCOSE 260*   COVID-19 Labs No results for input(s): DDIMER, FERRITIN, LDH, CRP in the last 72 hours. Lab Results  Component Value Date   SARSCOV2NAA NEGATIVE 02/06/2021    Radiological Exams on Admission: CT Head Wo Contrast  Result Date: 02/06/2021 CLINICAL DATA:  Fall with weakness EXAM: CT HEAD WITHOUT CONTRAST TECHNIQUE: Contiguous axial images were obtained from the base of the skull through the vertex without intravenous contrast. COMPARISON:  CT brain 12/01/2013 FINDINGS: Brain: No acute territorial infarction, hemorrhage, or intracranial mass. Moderate atrophy. Moderate chronic small vessel ischemic changes of the white matter. Mildly prominent ventricles felt secondary to atrophy. Vascular: No hyperdense vessels. Vertebral and carotid vascular calcification Skull: Normal. Negative for fracture or focal lesion. Sinuses/Orbits: No acute finding. Other: None IMPRESSION: 1. No CT evidence for acute intracranial abnormality. 2. Atrophy and chronic small vessel ischemic changes of the white matter Electronically Signed   By: Jasmine Pang M.D.   On: 02/06/2021 18:26   DG Chest Portable 1 View  Result Date: 02/06/2021 CLINICAL DATA:  Weakness EXAM: PORTABLE CHEST 1 VIEW COMPARISON:  12/02/2013 FINDINGS: Consolidation in the right lower lobe compatible with pneumonia. Heart is normal size. Left lung clear. No effusions or acute bony abnormality. IMPRESSION: Right lower lobe pneumonia. Followup PA and lateral chest X-ray is recommended in 3-4 weeks following trial of antibiotic therapy to ensure resolution and exclude underlying malignancy. Electronically Signed   By: Charlett Nose M.D.   On: 02/06/2021 17:53    EKG: Independently reviewed.  A.fib rvr 122, pvc.  Echocardiogram: Last echo in 2015.     Assessment/Plan Principal Problem:   Fall Active Problems:   Tobacco abuse   Generalized weakness   Atrial fibrillation with RVR (HCC)   Essential hypertension   COPD (chronic obstructive pulmonary disease) (HCC)   Diabetes mellitus (HCC)  Fall/ dizziness: We will admit to telemetry unit ad obtain orthostatic vitals.  We will cut back his bp meds and diabetes meds on hold so we can do SSI/ Glycemic protocol. Fall precaution// aspiration precaution.  MIVF.    Tobacco abuse: Nicotine patch.   Generalized weakness: PT consult.   A.FIB rvr: Cont dil drip and transition to po meds once pt has converted.  Cont asa and tiokosdyn. Eliquis or xarelo  once med red is done . Diastolic heart failure: 2 d echo tomorrow.  Cont  tikosyn, asa, and lasix to be ordered Prn while monitoring weight , electrolyte  and kidney function.    HTN: We will continue pt on diltiazem drip.  Cardiac diet.   COPD: Cont nebulizer and inhalers.   DM II; Hold home plan with insulin. we  DVT prophylaxis:  SCD's  Code Status:  Full code    Family Communication:  Janifer Adie (Other)  [phone] [facility] Phone)   Margaree Mackintosh ( Daughter ) [phone]  Disposition Plan:  Home    Consults called:  Gi-Dr.Wohl   Admission status: Inpatient.      Gertha Calkin MD [facility] [phone] How to contact the [facility] Attending or Consulting provider 7A - 7P or covering provider during after hours 7P -7A, for this patient.    Check the care team in [facility] and look for a) attending/consulting [facility] provider listed and b) the [facility] team listed Log into [email or web address] and use [facility] universal password to access. If you do not have the password, please contact the hospital operator. Locate the [facility] provider you are looking for under [facility] and page to a number that you can be directly reached. If you still have difficulty reaching the provider, please page the [facility] (Director  on Call) for the Hospitalists listed on amion for assistance. [email or web address] Password [email or web address] 02/07/2021, 12:01 AM

## 2021-02-06 NOTE — ED Notes (Signed)
Pt at CT

## 2021-02-06 NOTE — ED Notes (Signed)
X RAY at bedside 

## 2021-02-06 NOTE — ED Triage Notes (Addendum)
Pt presents to ED via EMS with c/o of weakness and a fall PTA, pt states he did lay on the ground for about 15 minutes before his grandson came and helped him off the floor. EMS states pt was in a  a-fib RVR rhythm with runs of v-tach. Pt states taking he does take a blood thinner for a-fib. Pt denies injury or hitting head when he fell. Pt lives alone at home at this time. Pt states intermittent SOB for the past few days but denies chest pain. Pt denies any recent illness.

## 2021-02-07 ENCOUNTER — Inpatient Hospital Stay
Admit: 2021-02-07 | Discharge: 2021-02-07 | Disposition: A | Discharge status: Home / Self Care | Payer: Medicare Other | Attending: Internal Medicine | Admitting: Internal Medicine

## 2021-02-07 ENCOUNTER — Inpatient Hospital Stay: Payer: Medicare Other

## 2021-02-07 DIAGNOSIS — W19XXXA Unspecified fall, initial encounter: Secondary | ICD-10-CM

## 2021-02-07 LAB — HEMOGLOBIN A1C
Hgb A1c MFr Bld: 7.1 % — ABNORMAL HIGH (ref 4.8–5.6)
Mean Plasma Glucose: 157.07 mg/dL

## 2021-02-07 LAB — CBC
HCT: 39.2 % (ref 39.0–52.0)
Hemoglobin: 12.9 g/dL — ABNORMAL LOW (ref 13.0–17.0)
MCH: 30.1 pg (ref 26.0–34.0)
MCHC: 32.9 g/dL (ref 30.0–36.0)
MCV: 91.6 fL (ref 80.0–100.0)
Platelets: 180 10*3/uL (ref 150–400)
RBC: 4.28 MIL/uL (ref 4.22–5.81)
RDW: 13.7 % (ref 11.5–15.5)
WBC: 14.1 10*3/uL — ABNORMAL HIGH (ref 4.0–10.5)
nRBC: 0 % (ref 0.0–0.2)

## 2021-02-07 LAB — BASIC METABOLIC PANEL
Anion gap: 14 (ref 5–15)
BUN: 58 mg/dL — ABNORMAL HIGH (ref 8–23)
CO2: 25 mmol/L (ref 22–32)
Calcium: 8.2 mg/dL — ABNORMAL LOW (ref 8.9–10.3)
Chloride: 98 mmol/L (ref 98–111)
Creatinine, Ser: 1.54 mg/dL — ABNORMAL HIGH (ref 0.61–1.24)
GFR, Estimated: 44 mL/min — ABNORMAL LOW (ref >60)
Glucose, Bld: 237 mg/dL — ABNORMAL HIGH (ref 70–99)
Potassium: 3 mmol/L — ABNORMAL LOW (ref 3.5–5.1)
Sodium: 137 mmol/L (ref 135–145)

## 2021-02-07 LAB — IRON AND TIBC
Iron: 10 ug/dL — ABNORMAL LOW (ref 45–182)
Saturation Ratios: 6 % — ABNORMAL LOW (ref 17.9–39.5)
TIBC: 160 ug/dL — ABNORMAL LOW (ref 250–450)
UIBC: 150 ug/dL

## 2021-02-07 LAB — MAGNESIUM: Magnesium: 2 mg/dL (ref 1.7–2.4)

## 2021-02-07 LAB — PHOSPHORUS: Phosphorus: 3.1 mg/dL (ref 2.5–4.6)

## 2021-02-07 LAB — VITAMIN D 25 HYDROXY (VIT D DEFICIENCY, FRACTURES): Vit D, 25-Hydroxy: 50.28 ng/mL (ref 30–100)

## 2021-02-07 LAB — VITAMIN B12: Vitamin B-12: 1174 pg/mL — ABNORMAL HIGH (ref 180–914)

## 2021-02-07 LAB — CBG MONITORING, ED
Glucose-Capillary: 217 mg/dL — ABNORMAL HIGH (ref 70–99)
Glucose-Capillary: 223 mg/dL — ABNORMAL HIGH (ref 70–99)
Glucose-Capillary: 262 mg/dL — ABNORMAL HIGH (ref 70–99)
Glucose-Capillary: 301 mg/dL — ABNORMAL HIGH (ref 70–99)

## 2021-02-07 LAB — GLUCOSE, CAPILLARY: Glucose-Capillary: 263 mg/dL — ABNORMAL HIGH (ref 70–99)

## 2021-02-07 LAB — FOLATE: Folate: 28 ng/mL (ref >5.9)

## 2021-02-07 MED ORDER — DILTIAZEM HCL ER COATED BEADS 240 MG PO CP24
240.0000 mg | ORAL_CAPSULE | Freq: Every day | ORAL | Status: DC
  Filled 2021-02-07: qty 1

## 2021-02-07 MED ORDER — SODIUM CHLORIDE 0.9 % IV SOLN
1.0000 g | INTRAVENOUS | Status: DC
  Administered 2021-02-07 – 2021-02-10 (×4): 1 g via INTRAVENOUS
  Filled 2021-02-07 (×5): qty 10

## 2021-02-07 MED ORDER — DILTIAZEM HCL ER COATED BEADS 120 MG PO CP24
240.0000 mg | ORAL_CAPSULE | Freq: Every day | ORAL | Status: DC
  Administered 2021-02-07 – 2021-02-14 (×8): 240 mg via ORAL
  Filled 2021-02-07 (×2): qty 2
  Filled 2021-02-07: qty 1
  Filled 2021-02-07 (×5): qty 2

## 2021-02-07 MED ORDER — ENSURE MAX PROTEIN PO LIQD
11.0000 [oz_av] | Freq: Two times a day (BID) | ORAL | Status: DC
  Administered 2021-02-07 – 2021-02-12 (×6): 11 [oz_av] via ORAL
  Filled 2021-02-07: qty 330

## 2021-02-07 MED ORDER — POTASSIUM CHLORIDE CRYS ER 20 MEQ PO TBCR
40.0000 meq | EXTENDED_RELEASE_TABLET | Freq: Once | ORAL | Status: AC
  Administered 2021-02-07: 40 meq via ORAL
  Filled 2021-02-07: qty 2

## 2021-02-07 MED ORDER — RIVAROXABAN 15 MG PO TABS
15.0000 mg | ORAL_TABLET | Freq: Every day | ORAL | Status: DC
  Administered 2021-02-07 – 2021-02-09 (×3): 15 mg via ORAL
  Filled 2021-02-07 (×4): qty 1

## 2021-02-07 MED ORDER — ADULT MULTIVITAMIN W/MINERALS CH
1.0000 | ORAL_TABLET | Freq: Every day | ORAL | Status: DC
  Administered 2021-02-08 – 2021-02-14 (×7): 1 via ORAL
  Filled 2021-02-07 (×7): qty 1

## 2021-02-07 MED ORDER — DILTIAZEM HCL 60 MG PO TABS
60.0000 mg | ORAL_TABLET | Freq: Four times a day (QID) | ORAL | Status: DC
  Administered 2021-02-07: 60 mg via ORAL
  Filled 2021-02-07: qty 1

## 2021-02-07 MED ORDER — THIAMINE HCL 100 MG PO TABS
100.0000 mg | ORAL_TABLET | Freq: Every day | ORAL | Status: DC
  Administered 2021-02-08 – 2021-02-14 (×7): 100 mg via ORAL
  Filled 2021-02-07 (×7): qty 1

## 2021-02-07 MED ORDER — GABAPENTIN 100 MG PO CAPS
200.0000 mg | ORAL_CAPSULE | Freq: Three times a day (TID) | ORAL | Status: DC
  Administered 2021-02-07 – 2021-02-14 (×21): 200 mg via ORAL
  Filled 2021-02-07 (×21): qty 2

## 2021-02-07 MED ORDER — FOLIC ACID 1 MG PO TABS
1.0000 mg | ORAL_TABLET | Freq: Every day | ORAL | Status: DC
  Administered 2021-02-08 – 2021-02-14 (×7): 1 mg via ORAL
  Filled 2021-02-07 (×8): qty 1

## 2021-02-07 MED ORDER — SODIUM CHLORIDE 0.9 % IV SOLN
500.0000 mg | INTRAVENOUS | Status: AC
  Administered 2021-02-07 – 2021-02-10 (×4): 500 mg via INTRAVENOUS
  Filled 2021-02-07 (×4): qty 500

## 2021-02-07 MED ORDER — POTASSIUM CHLORIDE CRYS ER 20 MEQ PO TBCR
40.0000 meq | EXTENDED_RELEASE_TABLET | ORAL | Status: AC
  Administered 2021-02-07 (×2): 40 meq via ORAL
  Filled 2021-02-07 (×2): qty 2

## 2021-02-07 MED ORDER — RIVAROXABAN 20 MG PO TABS: 20.0000 mg | ORAL_TABLET | Freq: Every day | ORAL | Status: DC

## 2021-02-07 NOTE — Progress Notes (Signed)
[facility] Progress Note  Patient: Samuel Thomas    IHK:[id number]  DOA: 02/06/2021     Date of Service: the patient was seen and examined on 02/07/2021  No chief complaint on file.  Brief hospital course: MICHAELJAMES MILNES is a 85 y.o. male with PMH of COPD. DMT2, HTN, Hearing loss, pulmonary HTN,  seen in ed with complaints of fall. PT had a fall today between 3-5 pm, and then his neighbor called his nephew and he called ems. Pt is alert,awake and oriented but is his baseline.  Pt states before hsi fall he was dizzy, and weak and his legs felt like water. It has happened before four days ago when he fell and was dizzy. Pt denies any eyes ears, nose or speech or gait issues. Pt states he is weak in his legs. At home has not been abel to do ADL. Daughter at bedside pt has been declining for past few weeks.  Pt has h/o c/h a/fib for 20 year, and takes [place]. Pt also takes blood thinner but does not know name, then pt states he take eliquis and xarelto both. Once med rec is done we can cont with the correct medication.      Assessment and Plan: Principal Problem:   Fall Active Problems:   Tobacco abuse   Generalized weakness   Atrial fibrillation with RVR (HCC)   Essential hypertension   COPD (chronic obstructive pulmonary disease) (HCC)   Diabetes mellitus (HCC)   A.FIB rvr: S/p Cardizem IV infusion and transitioned to Cardizem CD 240 mg daily as per cardiology Cont asa and tiokosdyn. Resumed Xarelto, Eliquis will be discontinued on discharge.   Diastolic heart failure: 2 d echo   Cont  tikosyn, asa, and lasix to be ordered Prn while monitoring weight , electrolyte  and kidney function.  Cardiology consult appreciated  Hypokalemic, potassium repleted  Fall/ dizziness: We will admit to telemetry unit ad obtain orthostatic vitals.  We will cut back his bp meds and diabetes meds on hold so we can do SSI/ Glycemic protocol. Fall precaution// aspiration precaution.  MIVF.   Check orthostatic BP   Community-acquired pneumonia, Leukocytosis, chest x-ray consistent with right lower lobe pneumonia Patient received ceftriaxone azithromycin in the ED which has been continued   Abnormal thyroid functions, TSH at lower end, slightly elevated FT4 level Follow free T3 Thyroid US 1. Normal-sized thyroid with bilateral nodules as above. None meets criteria for biopsy. 2. Recommend annual/biennial ultrasound follow-up of inferior right nodule as above, until stability x5 years confirmed. Patient may need to be treated for hypothyroid, will recheck free T4 level tomorrow a.m.   AKI on CKD stage III Creatinine  1.9--1.5 gradually improving Continue IV fluid for hydration   Tobacco abuse: Nicotine patch.    Generalized weakness: PT consult.      HTN: S/p Cardizem IV infusion, started Cardizem CD  We will continue monitor BP and titrate medications accordingly   COPD: Cont nebulizer and inhalers.    DM II; Held home medications for now Continue moderate sliding scale, monitor FSBG Continue diabetic diet    Body mass index is 28.19 kg/m.  Nutrition Problem: Increased nutrient needs Etiology: chronic illness (COPD) Interventions:       Diet: Carb modified diet DVT Prophylaxis: Therapeutic Anticoagulation with Xarelto    Advance goals of care discussion: DNR  Family Communication: family was not present at bedside, at the time of interview.  The pt provided permission to discuss medical plan with  the family. Opportunity was given to ask question and all questions were answered satisfactorily.   Disposition:  Pt is from Home, admitted with fall, PNA and A. fib with RVR, still has generalized weakness, at risk for fall, which precludes a safe discharge. Discharge to home versus SNF, pending PT OT eval, when stable.  Subjective: No significant events overnight, patient was admitted after a fall and was found to have A. fib RVR.  Currently  patient is asymptomatic, denies any chest pain or palpitations, no any other active issues.   Physical Exam: General:  alert oriented to time, place, and person.  Appear in no distress, affect appropriate Eyes: PERRLA ENT: Oral Mucosa Clear, moist  Neck: no JVD,  Cardiovascular: S1 and S2 Present, no Murmur,  Respiratory: good respiratory effort, Bilateral Air entry equal and Decreased, no Crackles, no wheezes Abdomen: Bowel Sound present, Soft and no tenderness,  Skin: no rashes Extremities: no Pedal edema, no calf tenderness Neurologic: without any new focal findings Gait not checked due to patient safety concerns  Vitals:   02/07/21 1415 02/07/21 1430 02/07/21 1453 02/07/21 1515  BP: 134/67 125/75 136/60 123/78  Pulse: 91 94  97  Resp: (!) 24 (!) 26  (!) 25  Temp:      TempSrc:      SpO2: 93% 92%  95%  Weight:      Height:        Intake/Output Summary (Last 24 hours) at 02/07/2021 1558 Last data filed at 02/07/2021 1413 Gross per 24 hour  Intake 546.79 ml  Output 400 ml  Net 146.79 ml   Filed Weights   02/06/21 1727  Weight: 81.6 kg    Data Reviewed: I have personally reviewed and interpreted daily labs, tele strips, imagings as discussed above. I reviewed all nursing notes, pharmacy notes, vitals, pertinent old records I have discussed plan of care as described above with RN and patient/family.  CBC: Recent Labs  Lab 02/06/21 1726 02/07/21 0535  WBC 12.4* 14.1*  HGB 13.7 12.9*  HCT 39.4 39.2  MCV 93.4 91.6  PLT 159 180   Basic Metabolic Panel: Recent Labs  Lab 02/06/21 1726 02/07/21 0535  NA 139 137  K 3.5 3.0*  CL 100 98  CO2 27 25  GLUCOSE 260* 237*  BUN 70* 58*  CREATININE 1.90* 1.54*  CALCIUM 8.3* 8.2*  MG  --  2.0    Studies: CT Head Wo Contrast  Result Date: 02/06/2021 CLINICAL DATA:  Fall with weakness EXAM: CT HEAD WITHOUT CONTRAST TECHNIQUE: Contiguous axial images were obtained from the base of the skull through the vertex  without intravenous contrast. COMPARISON:  CT brain 12/01/2013 FINDINGS: Brain: No acute territorial infarction, hemorrhage, or intracranial mass. Moderate atrophy. Moderate chronic small vessel ischemic changes of the white matter. Mildly prominent ventricles felt secondary to atrophy. Vascular: No hyperdense vessels. Vertebral and carotid vascular calcification Skull: Normal. Negative for fracture or focal lesion. Sinuses/Orbits: No acute finding. Other: None IMPRESSION: 1. No CT evidence for acute intracranial abnormality. 2. Atrophy and chronic small vessel ischemic changes of the white matter Electronically Signed   By: Jasmine Pang M.D.   On: 02/06/2021 18:26   DG Chest Portable 1 View  Result Date: 02/06/2021 CLINICAL DATA:  Weakness EXAM: PORTABLE CHEST 1 VIEW COMPARISON:  12/02/2013 FINDINGS: Consolidation in the right lower lobe compatible with pneumonia. Heart is normal size. Left lung clear. No effusions or acute bony abnormality. IMPRESSION: Right lower lobe pneumonia. Followup PA and lateral  chest X-ray is recommended in 3-4 weeks following trial of antibiotic therapy to ensure resolution and exclude underlying malignancy. Electronically Signed   By: Charlett Nose M.D.   On: 02/06/2021 17:53   US THYROID  Result Date: 02/07/2021 CLINICAL DATA:  Elevated serum free T4 level EXAM: THYROID ULTRASOUND TECHNIQUE: Ultrasound examination of the thyroid gland and adjacent soft tissues was performed. COMPARISON:  None. FINDINGS: Parenchymal Echotexture: Moderately heterogenous Isthmus: 0.2 cm thickness Right lobe: 4.2 x 1.8 x 1.8 cm Left lobe: 3.5 x 1.5 x 1.2 cm _________________________________________________________ Estimated total number of nodules >/= 1 cm: 1 Number of spongiform nodules >/=  2 cm not described below (TR1): 0 Number of mixed cystic and solid nodules >/= 1.5 cm not described below (TR2): 0 _________________________________________________________ Nodule 1: 0.9 cm benign septated  cyst, mid right Nodule # 2: Location: Right; inferior Maximum size: 1.1 cm; Other 2 dimensions: 1.1 x 0.7 cm Composition: solid/almost completely solid (2) Echogenicity: hypoechoic (2) Shape: not taller-than-wide (0) Margins: smooth (0) Echogenic foci: macrocalcifications (1) ACR TI-RADS total points: 5. ACR TI-RADS risk category: TR4 (4-6 points). ACR TI-RADS recommendations: *Given size (>/= 1 - 1.4 cm) and appearance, a follow-up ultrasound in 1 year should be considered based on TI-RADS criteria. _________________________________________________________ Nodule 3: 0.7 cm complex cyst, mid left; This nodule does NOT meet TI-RADS criteria for biopsy or dedicated follow-up. No regional cervical adenopathy identified. IMPRESSION: 1. Normal-sized thyroid with bilateral nodules as above. None meets criteria for biopsy. 2. Recommend annual/biennial ultrasound follow-up of inferior right nodule as above, until stability x5 years confirmed. The above is in keeping with the ACR TI-RADS recommendations - J Am Coll Radiol 2017;14:587-595. Electronically Signed   By: Corlis Leak M.D.   On: 02/07/2021 12:33    Scheduled Meds:  aspirin EC  81 mg Oral Daily   atorvastatin  20 mg Oral QHS   budesonide  0.5 mg Nebulization Daily   diltiazem  240 mg Oral Daily   dofetilide  125 mcg Oral Q24H   [START ON 02/08/2021] folic acid  1 mg Oral Daily   gabapentin  200 mg Oral TID   insulin aspart  0-9 Units Subcutaneous TID WC   [START ON 02/08/2021] multivitamin with minerals  1 tablet Oral Daily   Ensure Max Protein  11 oz Oral BID   sodium chloride flush  3 mL Intravenous Q12H   [START ON 02/08/2021] thiamine  100 mg Oral Daily   tiotropium  18 mcg Inhalation Daily   Continuous Infusions:  azithromycin     cefTRIAXone (ROCEPHIN)  IV     lactated ringers 75 mL/hr at 02/06/21 2325   PRN Meds: acetaminophen **OR** acetaminophen, albuterol, bisacodyl, hydrALAZINE, ipratropium-albuterol, ondansetron **OR** ondansetron  (ZOFRAN) IV, polyethylene glycol  Time spent: 35 minutes  Author: Gillis Santa. MD [facility] 02/07/2021 3:58 PM  To reach On-call, see care teams to locate the attending and reach out to them via [email or web address]. If 7PM-7AM, please contact night-coverage If you still have difficulty reaching the attending provider, please page the [facility] (Director on Call) for [facility] on amion for assistance.

## 2021-02-07 NOTE — ED Notes (Signed)
Pt asleep but easily woken by this RN's voice/touch.

## 2021-02-07 NOTE — Progress Notes (Signed)
PT Cancellation Note  Patient Details Name: Samuel Thomas MRN: [id number] DOB: [birth year]   Cancelled Treatment:    Reason Eval/Treat Not Completed: Patient not medically ready PT orders received, chart reviewed. Pt noted to be new to diltiazem drip & per therapy protocols, will hold for 24 hrs following initiation of medication. Pt also has a pending cardiology consult. Will re-attempt as able, as pt is appropriate for PT intervention.  Aleda Grana, PT, DPT 02/07/21, 1:19 PM   Sandi Mariscal 02/07/2021, 1:18 PM

## 2021-02-07 NOTE — ED Notes (Signed)
Pt grandson requesting to speak with social worker regarding obtaining medical POA and finding placement for pt after discharge d/t unsafe living environment currently. Secure chat sent to SW.

## 2021-02-07 NOTE — ED Notes (Signed)
ECHO at bedside.

## 2021-02-07 NOTE — ED Notes (Signed)
Informed RN bed assigned 

## 2021-02-07 NOTE — ED Notes (Signed)
Provider at bedside

## 2021-02-07 NOTE — ED Notes (Signed)
Call placed to floor to give report, floor charge RN has just got out of report and will look over chart and assign bed.

## 2021-02-07 NOTE — Consult Note (Addendum)
[facility] Cardiology Consultation Note  Patient ID: Samuel Thomas, MRN: [id number], DOB/AGE: 83-Jul-1937 83 y.o. Admit date: 02/06/2021   Date of Consult: 02/07/2021 Primary Physician: Jenell Milliner, MD Primary Cardiologist: Dr. Julio Alm  Chief Complaint: No chief complaint on file.  Reason for Consult:Atrial fibrillation with RVR  HPI: 85 y.o. male with a past medical history of atrial fibrillation anticoagulated with Xarelto who presented to the ED with complaints of recent fall.  Patient had endorsed some dizziness and weakness in his legs prior to his fall, he denies syncope or loss of consciousness.  Patient was found to be in atrial fibrillation with rapid ventricular rate of 122 bpm on arrival at the ED.  Patient takes Tikosyn for maintenance of normal sinus rhythm as well as Xarelto for stroke risk reduction with atrial fibrillation.  Patient was started on a diltiazem drip in the ER with improvement in his heart rate to 90-100 bpm.  Patient still endorses some fatigue but denies any problems with shortness of breath or chest pain at this time.  Patient has no complaints of lower extremity edema, weakness, syncope, presyncope.  Past Medical History:  Diagnosis Date  . COPD (chronic obstructive pulmonary disease) (HCC)   . DM2 (diabetes mellitus, type 2) (HCC)   . HLD (hyperlipidemia)   . HTN (hypertension)       Surgical History:  Past Surgical History:  Procedure Laterality Date  . HERNIA REPAIR       Home Meds: Prior to Admission medications   Medication Sig Start Date End Date Taking? Authorizing Provider  apixaban (ELIQUIS) 5 MG TABS tablet Eliquis 5 mg tablet  Take 1 tablet by mouth twice daily   Yes [provider]  atorvastatin (LIPITOR) 20 MG tablet Take 20 mg by mouth at bedtime.   Yes [provider]  Calcium Carb-Cholecalciferol 600-400 MG-UNIT TABS Take 1 tablet by mouth daily.   Yes [provider]  dofetilide (TIKOSYN) 125 MCG  capsule Take 125 mcg by mouth 2 (two) times daily. 11/29/17  Yes [provider]  finasteride (PROSCAR) 5 MG tablet Take 5 mg by mouth daily. 10/31/18  Yes [provider]  furosemide (LASIX) 40 MG tablet Take 1 tablet (40 mg total) by mouth daily. 12/07/13  Yes Zannie Cove, MD  gabapentin (NEURONTIN) 300 MG capsule Take 300 mg by mouth 3 (three) times daily. 01/17/21  Yes [provider]  insulin lispro (HUMALOG) 100 UNIT/ML injection Inject 6 Units into the skin 3 (three) times daily before meals.   Yes [provider]  levalbuterol Pauline Aus HFA) 45 MCG/ACT inhaler Inhale into the lungs every 4 (four) hours as needed for wheezing.   Yes [provider]  loratadine (CLARITIN) 10 MG tablet Take 10 mg by mouth daily.   Yes [provider]  metFORMIN (GLUCOPHAGE) 1000 MG tablet Take 1,000 mg by mouth 2 (two) times daily. 12/17/20  Yes [provider]  metoprolol succinate (TOPROL-XL) 25 MG 24 hr tablet Take 25 mg by mouth daily. 08/09/17 02/06/21 Yes [provider]  omeprazole (PRILOSEC) 20 MG capsule Take 20 mg by mouth daily. 06/26/20 06/26/21 Yes [provider]  potassium chloride SA (K-DUR,KLOR-CON) 20 MEQ tablet Take 20 mEq by mouth daily. 02/02/14  Yes [provider]  rivaroxaban (XARELTO) 20 MG TABS tablet Take by mouth. 08/15/20  Yes [provider]  tamsulosin (FLOMAX) 0.4 MG CAPS capsule Take 0.4 mg by mouth daily.   Yes [provider]  tiotropium [facility]  HANDIHALER) 18 MCG inhalation capsule Spiriva with HandiHaler 18 mcg and inhalation capsules   Yes [provider]  albuterol (VENTOLIN HFA) 108 (90 Base) MCG/ACT inhaler Ventolin HFA 90 mcg/actuation aerosol inhaler  Inhale 2 puffs by mouth every 6 hours as needed for wheezing 10/06/19   [provider]  aspirin 81 MG EC tablet aspirin 81 mg tablet,delayed release  Take 1 tablet every day by oral route. Patient not  taking: No sig reported    [provider]  budesonide (PULMICORT) 0.5 MG/2ML nebulizer solution Inhale 0.5 mg into the lungs daily. 02/07/15   [provider]  cetirizine (ZYRTEC) 10 MG tablet cetirizine 10 mg tablet  Take 1 tablet every day by oral route.    [provider]  colchicine 0.6 MG tablet Take 0.6 mg by mouth as needed (gout). 06/13/20 06/13/21  [provider]  DULoxetine (CYMBALTA) 60 MG capsule duloxetine 60 mg capsule,delayed release  Take 1 capsule by mouth once daily Patient not taking: No sig reported    [provider]  FLUoxetine (PROZAC) 20 MG capsule Take 20 mg by mouth daily. Patient not taking: No sig reported    [provider]  fluticasone (FLONASE) 50 MCG/ACT nasal spray Place 1-2 sprays into both nostrils daily.    [provider]  ipratropium-albuterol (DUONEB) 0.5-2.5 (3) MG/3ML SOLN Inhale into the lungs. 04/16/19 11/10/20  [provider]  Lactobacillus Acid-Pectin (ACIDOPHILUS/PECTIN) CAPS Acidophilus Probiotic 100 million cell-10 mg capsule  Take 1 capsule every day by oral route.    [provider]  Multiple Vitamin (ONE DAILY) tablet Take 1 tablet by mouth daily.    [provider]  naloxone [facility]) nasal spray 4 mg/0.1 mL Narcan 4 mg/actuation nasal spray 04/11/20   [provider]  NIFEdipine (PROCARDIA XL/ADALAT-CC) 90 MG 24 hr tablet Take 90 mg by mouth daily.    [provider]  Probiotic Product ([employer] PROBIOTIC) CHEW Chew by mouth.    [provider]  senna-docusate (SENOKOT-S) 8.6-50 MG tablet Senna with Docusate Sodium 8.6 mg-50 mg tablet  Take 2 tablets twice a day by oral route.    [provider]  TRUETRACK TEST test strip  02/21/14   [provider]    Inpatient Medications:  . aspirin EC  81 mg Oral Daily  . atorvastatin  20 mg Oral QHS  . budesonide  0.5 mg Nebulization Daily  . diltiazem  60 mg Oral Q6H  .  dofetilide  125 mcg Oral Q24H  . gabapentin  200 mg Oral TID  . insulin aspart  0-9 Units Subcutaneous TID WC  . sodium chloride flush  3 mL Intravenous Q12H  . tiotropium  18 mcg Inhalation Daily   . azithromycin    . cefTRIAXone (ROCEPHIN)  IV    . diltiazem (CARDIZEM) infusion 10 mg/hr (02/07/21 1017)  . lactated ringers 75 mL/hr at 02/06/21 2325    Allergies:  Allergies  Allergen Reactions  . Penicillins Rash and Swelling    Break outs swelling Other reaction(s): SWELLING in 1955 Break outs swelling Other reaction(s): SWELLING in 1955 Break outs swelling Other reaction(s): SWELLING in 1955 Break outs swelling Break outs swelling Other reaction(s): SWELLING in 1955 Break outs swelling Other reaction(s): SWELLING in 1955 Break outs swelling Break outs swelling Other reaction(s): SWELLING in 1955 Break outs swelling Other reaction(s): SWELLING in 1955 Break outs swelling Other reaction(s): SWELLING in 1955 Break outs swelling Break outs swelling Other reaction(s): SWELLING in 1955  Break  outs swelling Other reaction(s): SWELLING in 1955 Break outs swelling Other reaction(s): SWELLING in 1955 Break outs swelling Other reaction(s): SWELLING in 1955 Break outs swelling Break outs swelling Other reaction(s): SWELLING in 1955 Break outs swelling Other reaction(s): SWELLING in 1955 Break outs swelling Break outs swelling Other reaction(s): SWELLING in 1955   . Apixaban Other (See Comments)    Cold intolerance Cold intolerance   . Prednisone Other (See Comments) and Rash    Makes mean  Significant mood and behavioral changes - 'felt like I wasn't in control of anything' Makes mean  Significant mood and behavioral changes - 'felt like I wasn't in control of anything' Makes mean  Significant mood and behavioral changes - 'felt like I wasn't in control of anything' Significant mood and behavioral changes - 'felt like I wasn't in control of anything' Makes mean   Significant mood and behavioral changes - 'felt like I wasn't in control of anything' Makes mean  Significant mood and behavioral changes - 'felt like I wasn't in control of anything' Makes mean  Significant mood and behavioral changes - 'felt like I wasn't in control of anything' Significant mood and behavioral changes - 'felt like I wasn't in control of anything' Makes mean  Makes mean  Significant mood and behavioral changes - 'felt like I wasn't in control of anything' Makes mean  Significant mood and behavioral changes - 'felt like I wasn't in control of anything' Makes mean  Significant mood and behavioral changes - 'felt like I wasn't in control of anything' Significant mood and behavioral changes - 'felt like I wasn't in control of anything' Makes mean  Significant mood and behavioral changes - 'felt like I wasn't in control of anything' Makes mean  Significant mood and behavioral changes - 'felt like I wasn't in control of anything'  Makes mean  Significant mood and behavioral changes - 'felt like I wasn't in control of anything' Makes mean  Significant mood and behavioral changes - 'felt like I wasn't in control of anything' Makes mean  Significant mood and behavioral changes - 'felt like I wasn't in control of anything' Significant mood and behavioral changes - 'felt like I wasn't in control of anything' Makes mean  Significant mood and behavioral changes - 'felt like I wasn't in control of anything' Makes mean  Significant mood and behavioral changes - 'felt like I wasn't in control of anything' Makes mean  Significant mood and behavioral changes - 'felt like I wasn't in control of anything' Significant mood and behavioral changes - 'felt like I wasn't in control of anything' Makes mean  Makes mean  Significant mood and behavioral changes - 'felt like I wasn't in control of anything'     Social History   Socioeconomic History  . Marital status: Divorced     Spouse name: Not on file  . Number of children: Not on file  . Years of education: Not on file  . Highest education level: Not on file  Occupational History  . Not on file  Tobacco Use  . Smoking status: Former    Types: Cigarettes    Quit date: 12/01/1973    Years since quitting: 47.2  . Smokeless tobacco: Never  Substance and Sexual Activity  . Alcohol use: No    Alcohol/week: 0.0 standard drinks  . Drug use: No  . Sexual activity: Not on file  Other Topics Concern  . Not on file  Social History Narrative  . Not on file   Social  Determinants of Health   Financial Resource Strain: Not on file  Food Insecurity: Not on file  Transportation Needs: Not on file  Physical Activity: Not on file  Stress: Not on file  Social Connections: Not on file  Intimate Partner Violence: Not on file     History reviewed. No pertinent family history.   Review of Systems Positive for fatigue Negative for: General:  chills, fever, night sweats or weight changes.  Cardiovascular: PND orthopnea syncope dizziness  Dermatological skin lesions rashes Respiratory: Cough congestion Urologic: Frequent urination urination at night and hematuria Abdominal: negative for nausea, vomiting, diarrhea, bright red blood per rectum, melena, or hematemesis Neurologic: negative for visual changes, and/or hearing changes  All other systems reviewed and are otherwise negative except as noted above.  Labs: Recent Labs    02/06/21 1726  CKTOTAL 136   Lab Results  Component Value Date   WBC 14.1 (H) 02/07/2021   HGB 12.9 (L) 02/07/2021   HCT 39.2 02/07/2021   MCV 91.6 02/07/2021   PLT 180 02/07/2021    Recent Labs  Lab 02/07/21 0535  NA 137  K 3.0*  CL 98  CO2 25  BUN 58*  CREATININE 1.54*  CALCIUM 8.2*  GLUCOSE 237*   No results found for: CHOL, HDL, LDLCALC, TRIG No results found for: DDIMER  Radiology/Studies:  CT Head Wo Contrast  Result Date: 02/06/2021 CLINICAL DATA:  Fall with  weakness EXAM: CT HEAD WITHOUT CONTRAST TECHNIQUE: Contiguous axial images were obtained from the base of the skull through the vertex without intravenous contrast. COMPARISON:  CT brain 12/01/2013 FINDINGS: Brain: No acute territorial infarction, hemorrhage, or intracranial mass. Moderate atrophy. Moderate chronic small vessel ischemic changes of the white matter. Mildly prominent ventricles felt secondary to atrophy. Vascular: No hyperdense vessels. Vertebral and carotid vascular calcification Skull: Normal. Negative for fracture or focal lesion. Sinuses/Orbits: No acute finding. Other: None IMPRESSION: 1. No CT evidence for acute intracranial abnormality. 2. Atrophy and chronic small vessel ischemic changes of the white matter Electronically Signed   By: Jasmine Pang M.D.   On: 02/06/2021 18:26   DG Chest Portable 1 View  Result Date: 02/06/2021 CLINICAL DATA:  Weakness EXAM: PORTABLE CHEST 1 VIEW COMPARISON:  12/02/2013 FINDINGS: Consolidation in the right lower lobe compatible with pneumonia. Heart is normal size. Left lung clear. No effusions or acute bony abnormality. IMPRESSION: Right lower lobe pneumonia. Followup PA and lateral chest X-ray is recommended in 3-4 weeks following trial of antibiotic therapy to ensure resolution and exclude underlying malignancy. Electronically Signed   By: Charlett Nose M.D.   On: 02/06/2021 17:53   US THYROID  Result Date: 02/07/2021 CLINICAL DATA:  Elevated serum free T4 level EXAM: THYROID ULTRASOUND TECHNIQUE: Ultrasound examination of the thyroid gland and adjacent soft tissues was performed. COMPARISON:  None. FINDINGS: Parenchymal Echotexture: Moderately heterogenous Isthmus: 0.2 cm thickness Right lobe: 4.2 x 1.8 x 1.8 cm Left lobe: 3.5 x 1.5 x 1.2 cm _________________________________________________________ Estimated total number of nodules >/= 1 cm: 1 Number of spongiform nodules >/=  2 cm not described below (TR1): 0 Number of mixed cystic and solid  nodules >/= 1.5 cm not described below (TR2): 0 _________________________________________________________ Nodule 1: 0.9 cm benign septated cyst, mid right Nodule # 2: Location: Right; inferior Maximum size: 1.1 cm; Other 2 dimensions: 1.1 x 0.7 cm Composition: solid/almost completely solid (2) Echogenicity: hypoechoic (2) Shape: not taller-than-wide (0) Margins: smooth (0) Echogenic foci: macrocalcifications (1) ACR TI-RADS total points: 5. ACR  TI-RADS risk category: TR4 (4-6 points). ACR TI-RADS recommendations: *Given size (>/= 1 - 1.4 cm) and appearance, a follow-up ultrasound in 1 year should be considered based on TI-RADS criteria. _________________________________________________________ Nodule 3: 0.7 cm complex cyst, mid left; This nodule does NOT meet TI-RADS criteria for biopsy or dedicated follow-up. No regional cervical adenopathy identified. IMPRESSION: 1. Normal-sized thyroid with bilateral nodules as above. None meets criteria for biopsy. 2. Recommend annual/biennial ultrasound follow-up of inferior right nodule as above, until stability x5 years confirmed. The above is in keeping with the ACR TI-RADS recommendations - J Am Coll Radiol 2017;14:587-595. Electronically Signed   By: Corlis Leak M.D.   On: 02/07/2021 12:33    EKG: Atrial fibrillation with rapid ventricular rate 122 bpm  Weights: Filed Weights   02/06/21 1727  Weight: 81.6 kg     Physical Exam: Blood pressure (!) 143/77, pulse 83, temperature 98.3 F (36.8 C), temperature source Oral, resp. rate (!) 33, height 5\' 7"  (1.702 m), weight 81.6 kg, SpO2 91 %. Body mass index is 28.19 kg/m. General: Well developed, well nourished, in no acute distress. Head eyes ears nose throat: Normocephalic, atraumatic, sclera non-icteric, no xanthomas, nares are without discharge. No apparent thyromegaly and/or mass  Lungs: Normal respiratory effort.  no wheezes, no rales, no rhonchi.  Heart: Irregular rate and rhythm with normal S1 S2. no  murmur gallop, no rub, PMI is normal size and placement, carotid upstroke normal without bruit, jugular venous pressure is normal Abdomen: Soft, non-tender, non-distended with normoactive bowel sounds. No hepatomegaly. No rebound/guarding. No obvious abdominal masses. Abdominal aorta is normal size without bruit Extremities: No edema. no cyanosis, no clubbing, no ulcers  Peripheral : 2+ bilateral upper extremity pulses, 2+ bilateral femoral pulses, 2+ bilateral dorsal pedal pulse Neuro: Alert and oriented. No facial asymmetry. No focal deficit. Moves all extremities spontaneously. Musculoskeletal: Normal muscle tone without kyphosis Psych:  Responds to questions appropriately with a normal affect.    Assessment: 84 year old male with a past medical history of atrial fibrillation on Tikosyn and Xarelto, hypertension, COPD, type 2 diabetes presenting to the ER in atrial fibrillation with rapid ventricular rate.  Patient's heart rate appears better controlled with the diltiazem drip.  Plan: -Continue Tikosyn for rhythm control with atrial fibrillation with continued monitoring including ensuring GFR of greater than 30, magnesium greater than 2, potassium greater than 4, daily EKG -Continue potassium supplementation until potassium of greater than 4.0 -Transition to oral diltiazem 240 CD daily -Echocardiogram for evaluation of patient's heart structure and function.  -continue supportive care measures   Signed, 91 PA-C [facility] Cardiology 02/07/2021, 1:49 PM  The patient has been interviewed and examined. I agree with assessment and plan above. 02/09/2021 MD [facility]

## 2021-02-07 NOTE — ED Notes (Signed)
Dinner tray delivered.

## 2021-02-07 NOTE — Progress Notes (Signed)
OT Cancellation Note  Patient Details Name: Samuel Thomas MRN: [id number] DOB: 19-Mar-1936   Cancelled Treatment:    Reason Eval/Treat Not Completed: Patient at procedure or test/ unavailable. Consult received, chart reviewed. Pt out for testing. Also note cardiac consult pending, and pt on diltiazem drip. Will re-attempt OT evaluation at later date/time as medically appropriate.   Arman Filter., MPH, MS, OTR/L ascom [phone] 02/07/21, 12:23 PM

## 2021-02-07 NOTE — Progress Notes (Signed)
Initial Nutrition Assessment  DOCUMENTATION CODES:   Not applicable  INTERVENTION:   Ensure Max protein supplement BID, each supplement provides 150kcal and 30g of protein.  MVI, folic acid and thiamine po daily   Pt at high refeed risk; recommend monitor potassium, magnesium and phosphorus labs daily until stable  NUTRITION DIAGNOSIS:   Increased nutrient needs related to chronic illness (COPD) as evidenced by estimated needs.  GOAL:   Patient will meet greater than or equal to 90% of their needs  MONITOR:   PO intake, Supplement acceptance, Labs, Weight trends, Skin, I & O's  REASON FOR ASSESSMENT:   Consult Assessment of nutrition requirement/status  ASSESSMENT:   85 y/o male with h/o COPD, DM, HTN, BPH, CHF, divericulitis, OSA and etoh and subtance abuse who is admitted with fall.  RD working remotely.  Unable to see pt as pt remains in the ED. RD suspects pt with poor oral intake at baseline r/t substance abuse. RD will add supplements and vitamins to help pt meet his estimated needs. Pt is likely at refeed risk. There is no weight history in chart to determine if any recent significant weight changes. RD will obtain nutrition related history and exam if patient gets admitted into the hospital.   Medications reviewed and include: aspirin, insulin, azithromycin, ceftriaxone  Labs reviewed: K 3.0(L), BUN 58(H), creat 1.54(H), Mg 2.0 wnl Wbc- 14.1(H) Cbgs- 217, 223, 262 x 24 hrs AIC 7.1(H)- 10/27  NUTRITION - FOCUSED PHYSICAL EXAM: Unable to perform at this time   Diet Order:   Diet Order             Diet Carb Modified Fluid consistency: Thin; Room service appropriate? Yes  Diet effective now                  EDUCATION NEEDS:   No education needs have been identified at this time  Skin:   not assessed   Last BM:  pta  Height:   Ht Readings from Last 1 Encounters:  02/06/21 5\' 7"  (1.702 m)    Weight:   Wt Readings from Last 1 Encounters:   02/06/21 81.6 kg    Ideal Body Weight:  67.27 kg  BMI:  Body mass index is 28.19 kg/m.  Estimated Nutritional Needs:   Kcal:  1800-2100kcal/day  Protein:  90-105g/day  Fluid:  1.7-2.0L/day  02/08/21 MS, RD, LDN Please refer to [facility] for RD and/or RD on-call/weekend/after hours pager

## 2021-02-08 LAB — T4, FREE: Free T4: 1.36 ng/dL — ABNORMAL HIGH (ref 0.61–1.12)

## 2021-02-08 LAB — BASIC METABOLIC PANEL
Anion gap: 4 — ABNORMAL LOW (ref 5–15)
BUN: 49 mg/dL — ABNORMAL HIGH (ref 8–23)
CO2: 28 mmol/L (ref 22–32)
Calcium: 8.3 mg/dL — ABNORMAL LOW (ref 8.9–10.3)
Chloride: 106 mmol/L (ref 98–111)
Creatinine, Ser: 1.45 mg/dL — ABNORMAL HIGH (ref 0.61–1.24)
GFR, Estimated: 48 mL/min — ABNORMAL LOW (ref >60)
Glucose, Bld: 265 mg/dL — ABNORMAL HIGH (ref 70–99)
Potassium: 4.7 mmol/L (ref 3.5–5.1)
Sodium: 138 mmol/L (ref 135–145)

## 2021-02-08 LAB — PHOSPHORUS: Phosphorus: 2.3 mg/dL — ABNORMAL LOW (ref 2.5–4.6)

## 2021-02-08 LAB — CBC
HCT: 38.9 % — ABNORMAL LOW (ref 39.0–52.0)
Hemoglobin: 13 g/dL (ref 13.0–17.0)
MCH: 31.7 pg (ref 26.0–34.0)
MCHC: 33.4 g/dL (ref 30.0–36.0)
MCV: 94.9 fL (ref 80.0–100.0)
Platelets: 171 10*3/uL (ref 150–400)
RBC: 4.1 MIL/uL — ABNORMAL LOW (ref 4.22–5.81)
RDW: 13.7 % (ref 11.5–15.5)
WBC: 10.3 10*3/uL (ref 4.0–10.5)
nRBC: 0 % (ref 0.0–0.2)

## 2021-02-08 LAB — ECHOCARDIOGRAM COMPLETE
Height: 67 in
S' Lateral: 3.3 cm
Weight: 2880 oz

## 2021-02-08 LAB — MAGNESIUM: Magnesium: 2 mg/dL (ref 1.7–2.4)

## 2021-02-08 LAB — TSH: TSH: 1.525 u[IU]/mL (ref 0.350–4.500)

## 2021-02-08 LAB — GLUCOSE, CAPILLARY
Glucose-Capillary: 305 mg/dL — ABNORMAL HIGH (ref 70–99)
Glucose-Capillary: 335 mg/dL — ABNORMAL HIGH (ref 70–99)

## 2021-02-08 LAB — T3, FREE: T3, Free: 1.1 pg/mL — ABNORMAL LOW (ref 2.0–4.4)

## 2021-02-08 MED ORDER — POTASSIUM PHOSPHATES 15 MMOLE/5ML IV SOLN
15.0000 mmol | Freq: Once | INTRAVENOUS | Status: AC
  Administered 2021-02-08: 15 mmol via INTRAVENOUS
  Filled 2021-02-08: qty 5

## 2021-02-08 MED ORDER — POLYSACCHARIDE IRON COMPLEX 150 MG PO CAPS
150.0000 mg | ORAL_CAPSULE | Freq: Every day | ORAL | Status: DC
  Administered 2021-02-08 – 2021-02-14 (×7): 150 mg via ORAL
  Filled 2021-02-08 (×7): qty 1

## 2021-02-08 MED ORDER — ASCORBIC ACID 500 MG PO TABS
500.0000 mg | ORAL_TABLET | Freq: Every day | ORAL | Status: DC
  Administered 2021-02-08 – 2021-02-14 (×7): 500 mg via ORAL
  Filled 2021-02-08 (×7): qty 1

## 2021-02-08 MED ORDER — GUAIFENESIN-DM 100-10 MG/5ML PO SYRP
10.0000 mL | ORAL_SOLUTION | ORAL | Status: DC | PRN
  Administered 2021-02-09 – 2021-02-10 (×2): 10 mL via ORAL
  Filled 2021-02-08 (×2): qty 10

## 2021-02-08 MED ORDER — GUAIFENESIN ER 600 MG PO TB12
600.0000 mg | ORAL_TABLET | Freq: Two times a day (BID) | ORAL | Status: DC
  Administered 2021-02-08 – 2021-02-14 (×13): 600 mg via ORAL
  Filled 2021-02-08 (×13): qty 1

## 2021-02-08 NOTE — Evaluation (Addendum)
Occupational Therapy Evaluation Patient Details Name: Samuel Thomas MRN: [id number] DOB: 1935/05/26 Today's Date: 02/08/2021   History of Present Illness Per MD notes: Samuel Thomas is an 85 y.o. male with a past medical history of atrial fibrillation anticoagulated with Xarelto who presented to the ED with complaints of recent fall. Patient was found to be in atrial fibrillation with rapid ventricular rate of 122 bpm on arrival at the ED. Cardiology consulted.   Clinical Impression   Samuel Thomas was seen for OT evaluation this date. Prior to hospital admission, pt was generally independent with ADL management. Pt lives alone in a 2nd floor apartment home with a flight of steps to enter. Pt reports he has fallen on his steps multiple times in the past year. VSS t/o session. Pt denies adverse s/s. Orthostatic vitals taken during session. See below. Currently pt demonstrates impairments as described below (See OT problem list) which functionally limit his ability to perform ADL/self-care tasks. Pt currently requires MIN A for functional mobility, close SUPERVISION to MIN A for exertional LB ADL management, and SET-UP for UB ADL management. Pt would benefit from skilled OT services to address noted impairments and functional limitations (see below for any additional details) in order to maximize safety and independence while minimizing falls risk and caregiver burden. Upon hospital discharge, recommend STR to maximize pt safety and return to PLOF.    02/08/21 0930  Therapy Vitals  Patient Position (if appropriate) Orthostatic Vitals  Orthostatic Lying   BP- Lying 114/61  Pulse- Lying 81 ((semi-supine))  Orthostatic Sitting  BP- Sitting 128/67  Pulse- Sitting 88  Orthostatic Standing at 0 minutes  BP- Standing at 0 minutes 147/56  Pulse- Standing at 0 minutes 87  Orthostatic Standing at 3 minutes  BP- Standing at 3 minutes 127/62  Pulse- Standing at 3 minutes 90  Oxygen Therapy  SpO2 100 %  O2  Device Nasal Cannula  O2 Flow Rate (L/min) 2 L/min        Recommendations for follow up therapy are one component of a multi-disciplinary discharge planning process, led by the attending physician.  Recommendations may be updated based on patient status, additional functional criteria and insurance authorization.   Follow Up Recommendations  Skilled nursing-short term rehab (<3 hours/day)    Assistance Recommended at Discharge Intermittent Supervision/Assistance  Functional Status Assessment  Patient has had a recent decline in their functional status and demonstrates the ability to make significant improvements in function in a reasonable and predictable amount of time.  Equipment Recommendations  Other (comment) (Defer to next venue of care)    Recommendations for Other Services       Precautions / Restrictions Precautions Precautions: Fall Precaution Comments: Moderate Fall Restrictions Weight Bearing Restrictions: No      Mobility Bed Mobility Overal bed mobility: Needs Assistance Bed Mobility: Supine to Sit     Supine to sit: Modified independent (Device/Increase time);HOB elevated     General bed mobility comments: Increased time/effort to perform with heavy use of bed rails.    Transfers Overall transfer level: Needs assistance Equipment used: Rolling walker (2 wheels) Transfers: Sit to/from [employer] Sit to Stand: Min assist Stand pivot transfers: Supervision         General transfer comment: MIN  A for STS from bed in lowest position.      Balance Overall balance assessment: Needs assistance Sitting-balance support: Feet supported;No upper extremity supported Sitting balance-Samuel Thomas Scale: Good Sitting balance - Comments: No LOB appreciated with  weight shift during functional task.   Standing balance support: During functional activity;Reliant on assistive device for balance Standing balance-Samuel Thomas Scale: Fair Standing balance  comment: Heavy UE reliance on RW during static standing.                           ADL either performed or assessed with clinical judgement   ADL Overall ADL's : Needs assistance/impaired                                       General ADL Comments: Pt functionally limited by generalized weakness, decreased activity tolerance, and impaired balance. he requires MIN A for initial STS attempt with close CGA and cueing for safety during functional transfer to room recliner. He is able to don bilat hospital socks in figure four position with increased time/effort to perform but no physical assist required. Set-up assist for UB ADL management.     Vision Baseline Vision/History: 1 Wears glasses Ability to See in Adequate Light: 2 Moderately impaired Patient Visual Report: No change from baseline       Perception     Praxis      Pertinent Vitals/Pain Pain Assessment: No/denies pain Pain Score: 0-No pain Pain Intervention(s): Monitored during session     Hand Dominance Right   Extremity/Trunk Assessment Upper Extremity Assessment Upper Extremity Assessment: Generalized weakness   Lower Extremity Assessment Lower Extremity Assessment: Generalized weakness;Defer to PT evaluation   Cervical / Trunk Assessment Cervical / Trunk Assessment: Kyphotic   Communication Communication Communication: HOH (Wears hearing aid.)   Cognition Arousal/Alertness: Awake/alert Behavior During Therapy: WFL for tasks assessed/performed Overall Cognitive Status: Within Functional Limits for tasks assessed                                 General Comments: Pt generally A&O to self, place, situation. Talkative t/o session, but easily re-directable to task/topic at hand.     General Comments       Exercises Other Exercises Other Exercises: Pt educated on role of OT in acute setting, safe transfer techniques, safe use of AE/DME for ADL management, and LB  dressing task. Time for Orthostatic vitals.   Shoulder Instructions      Home Living Family/patient expects to be discharged to:: Private residence Living Arrangements: Alone Available Help at Discharge: Family;Friend(s);Available PRN/intermittently (Pt reports two grandchildren live nearby and check on him intermittently) Type of Home: Apartment Home Access: Stairs to enter [employer] of Steps: Flight with rail   Home Layout: One level     Bathroom Shower/Tub: Producer, television/film/video: Standard     Home Equipment: Shower seat;Grab bars - tub/shower;Cane - single Librarian, academic (2 wheels)          Prior Functioning/Environment Prior Level of Function : History of Falls (last six months);Needs assist       Physical Assist : Mobility (physical);ADLs (physical) Mobility (physical): Gait;Stairs ADLs (physical): IADLs Mobility Comments: Uses RW for functional mobility. Also reports having "walking stick" that he uses to go up and down stairs. Pt reports multiple falls on stairs into/out of his apartment. Reports his requests for transfer to ground floor apt have been denied/ignored. ADLs Comments: Family/friends provide some assist with cooking, cleaning, etc.        OT Problem List:  Decreased strength;Decreased coordination;Decreased activity tolerance;Decreased safety awareness;Impaired balance (sitting and/or standing);Decreased knowledge of use of DME or AE      OT Treatment/Interventions: Self-care/ADL training;Balance training;Therapeutic exercise;Therapeutic activities;Energy conservation;DME and/or AE instruction;Patient/family education    OT Goals(Current goals can be found in the care plan section) Acute Rehab OT Goals Patient Stated Goal: To feel better OT Goal Formulation: With patient Time For Goal Achievement: 02/22/21 Potential to Achieve Goals: Good  OT Frequency: Min 1X/week   Barriers to D/C: Decreased caregiver  support;Inaccessible home environment          Co-evaluation              AM-PAC OT "6 Clicks" Daily Activity     Outcome Measure Help from another person eating meals?: A Little Help from another person taking care of personal grooming?: A Little Help from another person toileting, which includes using toliet, bedpan, or urinal?: A Little Help from another person bathing (including washing, rinsing, drying)?: A Little Help from another person to put on and taking off regular upper body clothing?: A Little Help from another person to put on and taking off regular lower body clothing?: A Little 6 Click Score: 18   End of Session Equipment Utilized During Treatment: Gait belt;Rolling walker (2 wheels);Oxygen Nurse Communication: Other (comment) (IV alarm)  Activity Tolerance: Patient tolerated treatment well Patient left: in chair (With PT in room to begin session.)  OT Visit Diagnosis: Other abnormalities of gait and mobility (R26.89);Muscle weakness (generalized) (M62.81);History of falling (Z91.81)                Time: 0923-1000 OT Time Calculation (min): 37 min Charges:  OT General Charges $OT Visit: 1 Visit OT Evaluation $OT Eval Moderate Complexity: 1 Mod OT Treatments $Self Care/Home Management : 23-37 mins  Rockney Ghee, M.S., OTR/L Ascom: [phone] 02/08/21, 11:45 AM

## 2021-02-08 NOTE — TOC Initial Note (Addendum)
Transition of Care [facility]) - Initial/Assessment Note    Patient Details  Name: Samuel Thomas MRN: [id number] Date of Birth: 1935-12-27  Transition of Care [facility]) CM/SW Contact:    Magnus Ivan, LCSW Phone Number: 02/08/2021, 11:52 AM  Clinical Narrative:                CSW met with patient at bedside for high risk screening. Patient lives alone and drives himself to appointments. PCP is Dr. Arby Barrette. Pharmacy is [facility]. Patient has a RW, 2 canes, shower seat, raised toilet seat at home. Says he is not on o2 at home. Says his grandson Shanon Brow lives nearby and is supportive. PT eval pending.   Expected Discharge Plan: Home/Self Care Barriers to Discharge: Continued Medical Work up   Patient Goals and CMS Choice Patient states their goals for this hospitalization and ongoing recovery are:: to return home CMS Medicare.gov Compare Post Acute Care list provided to:: Patient Choice offered to / list presented to : Patient  Expected Discharge Plan and Services Expected Discharge Plan: Home/Self Care       Living arrangements for the past 2 months: Single Family Home                                      Prior Living Arrangements/Services Living arrangements for the past 2 months: Single Family Home Lives with:: Self Patient language and need for interpreter reviewed:: Yes Do you feel safe going back to the place where you live?: Yes      Need for Family Participation in Patient Care: Yes (Comment) Care giver support system in place?: Yes (comment) Current home services: DME Criminal Activity/Legal Involvement Pertinent to Current Situation/Hospitalization: No - Comment as needed  Activities of Daily Living Home Assistive Devices/Equipment: None ADL Screening (condition at time of admission) Patient's cognitive ability adequate to safely complete daily activities?: Yes Is the patient deaf or have difficulty hearing?: Yes Does the patient have difficulty seeing, even  when wearing glasses/contacts?: No Does the patient have difficulty concentrating, remembering, or making decisions?: No Patient able to express need for assistance with ADLs?: Yes Does the patient have difficulty dressing or bathing?: No Independently performs ADLs?: Yes (appropriate for developmental age) Does the patient have difficulty walking or climbing stairs?: No Weakness of Legs: Both Weakness of Arms/Hands: None  Permission Sought/Granted Permission sought to share information with : Facility Sport and exercise psychologist, Family Supports Permission granted to share information with : Yes, Verbal Permission Granted  Share Information with NAME: grandson - Carson Myrtle  Permission granted to share info w AGENCY: [place], DME if needed        Emotional Assessment       Orientation: : Oriented to Self, Oriented to Place, Oriented to Situation Alcohol / Substance Use: Not Applicable Psych Involvement: No (comment)  Admission diagnosis:  Fall [W19.XXXA] Elevated serum free T4 level [R79.89] Pneumonia due to infectious organism, unspecified laterality, unspecified part of lung [J18.9] Patient Active Problem List   Diagnosis Date Noted   Fall 02/06/2021   Generalized weakness 02/06/2021   Tobacco abuse 02/06/2021   Pain due to onychomycosis of toenails of both feet 05/20/2020   History of gout 04/02/2020   Acute encephalopathy 10/27/2017   Syncope and collapse 10/27/2017   Incomplete emptying of bladder 07/20/2017   Overactive detrusor 07/20/2017   Atrial fibrillation with RVR ([place]) 07/01/2017   Hearing loss, sensorineural, asymmetrical  03/09/2017   At risk for falls 03/24/2016   Polypharmacy 03/24/2016   Benign fibroma of prostate 16/60/6301   Diastolic heart failure ([place]) 08/16/2015   Pulmonary hypertension ([place]) 08/16/2015   Cough 08/15/2015   Breath shortness 08/15/2015   BP (high blood pressure) 07/24/2015   BPH with obstruction/lower urinary tract symptoms 07/02/2015    Diverticulitis large intestine 01/03/2015   Injury of kidney 01/02/2015   N&V (nausea and vomiting) 01/02/2015   Chronic pain associated with significant psychosocial dysfunction 10/04/2014   Chronic pain syndrome 10/04/2014   Encounter for monitoring opioid maintenance therapy 08/29/2014   Arthritis 07/24/2014   Venous stasis ulcer of lower extremity ([place]) 07/24/2014   Chronic pain of right knee 07/03/2014   Diabetes mellitus ([place]) 05/17/2014   Obstructive apnea 04/25/2014   Drug-induced constipation 04/03/2014   Arthritis of knee, degenerative 01/30/2014   Bacterial skin infection of leg 01/16/2014   Chronic diastolic heart failure ([place]) 01/16/2014   Edema leg 01/16/2014   Cellulitis of lower extremity 01/16/2014   Chronic combined systolic and diastolic heart failure ([place]) 60/01/9322   Acute diastolic heart failure ([place]) 12/02/2013   NSVT (nonsustained ventricular tachycardia) 12/01/2013   SIRS (systemic inflammatory response syndrome) ([place]) 12/01/2013   Altered mental status 12/01/2013   Hypokalemia 12/01/2013   DM2 (diabetes mellitus, type 2) ([place]) 12/01/2013   HTN (hypertension) 12/01/2013   Hypoxia 12/01/2013   Chronic, continuous use of opioids 09/18/2013   Pain in shoulder 07/21/2013   Pain of right hand 05/24/2013   Bilateral hand pain 05/24/2013   Other long term (current) drug therapy 01/03/2013   High risk medication use 01/03/2013   Muscle ache 11/23/2012   Suicidal ideation 11/23/2012   Cellulitis 09/25/2012   COPD (chronic obstructive pulmonary disease) ([place]) 09/25/2012   Apnea, sleep 09/25/2012   Diabetes mellitus type 2 in obese ([place]) 08/15/2012   Hook nail 08/15/2012   Fungal infection of nail 08/15/2012   Arthropathy of lumbar facet joint 07/08/2012   Nerve root pain 07/07/2012   Cannabis abuse 07/07/2012   Marijuana use 07/07/2012   Status post cataract extraction 06/16/2012   Artificial lens present 06/16/2012   Other specified health status  05/10/2012   Depression 05/10/2012   Alcohol use 05/10/2012   Lumbar canal stenosis 03/17/2012   Degenerative arthritis of hip 03/17/2012   Adiposity 03/17/2012   H/O arthrodesis 03/17/2012   S/P cervical spinal fusion 03/17/2012   Arthralgia of hip or thigh 01/12/2012   Lumbosacral spondylosis 12/10/2011   Pain in thoracic spine 12/10/2011   Brachial neuritis 10/27/2011   Hypercholesteremia 08/25/2011   Hypercholesterolemia 08/25/2011   Cervical spinal stenosis 07/27/2011   Cervical spondylosis with myelopathy 07/17/2011   Difficulty walking 07/13/2011   Thoracic and lumbosacral neuritis 07/13/2011   Diverticulosis of colon 05/25/2011   History of colonic polyps 05/25/2011   Diverticular disease of large intestine 05/25/2011   Astigmatism 04/10/2011   Cataract, nuclear sclerotic senile 04/10/2011   Diabetes mellitus type 2, uncontrolled 04/10/2011   Bilateral low back pain without sciatica 03/31/2011   Allergic rhinitis 06/20/2002   Asthma 06/20/2002   Moderate persistent asthma without complication 55/73/2202   Coronary atherosclerosis 06/17/2000   Essential hypertension 06/17/2000   PCP:  Danae Orleans, MD Pharmacy:   [facility] [place] [address]) [place] [address] Phone: [phone] Fax: [phone]     Social Determinants of Health (SDOH) Interventions    Readmission Risk Interventions Readmission Risk  Prevention Plan 02/08/2021  Transportation Screening Complete  Medication Review Press photographer) Complete  PCP or Specialist appointment within 3-5 days of discharge Complete  HRI or Home Care Consult Complete  SW Recovery Care/Counseling Consult Complete  Palliative Care Screening Not Applicable  Skilled Nursing Facility Complete  Some recent data might be hidden

## 2021-02-08 NOTE — Evaluation (Addendum)
Physical Therapy Evaluation Patient Details Name: Samuel Thomas MRN: [id number] DOB: 1936-04-05 Today's Date: 02/08/2021  History of Present Illness  Pt is an 85 y/o M admitted on 02/07/21 with c/c of a fall; pt also endorses another fall 4 days prior. Pt found to have a-fib with RVR & hypokalemic, PNA. PMH: COPD, DM2, HTN, hearing loss, pulmonary HTN  Clinical Impression  Pt seen for PT evaluation with pt reporting he is a "chronic damn faller" noting he's fallen down his flight of stairs multiple times prior to admission. On this date, pt is able to complete transfers & ambulate into hallway with RW & min assist. Pt maintains SpO2 >90% on 2L/min via nasal cannula. Pt c/o chronic back pain during mobility & demonstrates kyphotic posture. At this time, pt is unsafe to d/c home alone & would benefit from STR upon d/c to maximize independence with functional mobility & reduce fall risk prior to return home.        Recommendations for follow up therapy are one component of a multi-disciplinary discharge planning process, led by the attending physician.  Recommendations may be updated based on patient status, additional functional criteria and insurance authorization.  Follow Up Recommendations Skilled nursing-short term rehab (<3 hours/day)    Assistance Recommended at Discharge Frequent or constant Supervision/Assistance  Functional Status Assessment Patient has had a recent decline in their functional status and demonstrates the ability to make significant improvements in function in a reasonable and predictable amount of time.  Equipment Recommendations   (TBD in next venue)    Recommendations for Other Services       Precautions / Restrictions Precautions Precautions: Fall Precaution Comments: Moderate Fall Restrictions Weight Bearing Restrictions: No      Mobility  Bed Mobility            General bed mobility comments: not observed, pt received & left sitting in recliner     Transfers Overall transfer level: Needs assistance Equipment used: Rolling walker (2 wheels) Transfers: Sit to/from Stand Sit to Stand: Min assist          General transfer comment: min assist for sit>stand with RW    Ambulation/Gait Ambulation/Gait assistance: Min assist Gait Distance (Feet): 100 Feet Assistive device: Rolling walker (2 wheels) Gait Pattern/deviations: Decreased step length - right;Decreased step length - left;Decreased stride length;Trunk flexed Gait velocity: decreased   General Gait Details: slightly extra time to manevuer around Hydrologist    Modified Rankin (Stroke Patients Only)       Balance Overall balance assessment: Needs assistance Sitting-balance support: Feet supported;No upper extremity supported Sitting balance-Leahy Scale: Good Sitting balance - Comments: No LOB appreciated with weight shift during functional task.   Standing balance support: During functional activity;Reliant on assistive device for balance;Bilateral upper extremity supported Standing balance-Leahy Scale: Fair Standing balance comment: BUE support on RW                             Pertinent Vitals/Pain Pain Assessment: Faces Pain Score: 0-No pain Faces Pain Scale: Hurts whole lot Pain Location: chronic back pain Pain Descriptors / Indicators: Discomfort Pain Intervention(s): Monitored during session;Limited activity within patient's tolerance    Home Living Family/patient expects to be discharged to:: Private residence Living Arrangements: Alone Available Help at Discharge: Family;Friend(s);Available PRN/intermittently Type of Home: Apartment Home Access: Stairs to enter   Entrance Stairs-Number  of Steps: Flight with rail   Home Layout: One level Home Equipment: Shower seat;Grab bars - tub/shower;Cane - single Librarian, academic (2 wheels)      Prior Function Prior Level of Function : History  of Falls (last six months);Needs assist       Physical Assist : Mobility (physical) Mobility (physical): Gait;Stairs ADLs (physical): IADLs Mobility Comments: Uses RW for functional mobility. Also reports having "walking stick" that he uses to go up and down stairs. Pt reports multiple falls on stairs into/out of his apartment. Reports his requests for transfer to ground floor apt have been denied/ignored. ADLs Comments: Family/friends provide some assist with cooking, cleaning, etc.     Hand Dominance   Dominant Hand: Right    Extremity/Trunk Assessment   Upper Extremity Assessment Upper Extremity Assessment: Generalized weakness    Lower Extremity Assessment Lower Extremity Assessment: Generalized weakness    Cervical / Trunk Assessment Cervical / Trunk Assessment: Kyphotic  Communication   Communication: HOH (hearing aides)  Cognition Arousal/Alertness: Awake/alert Behavior During Therapy: WFL for tasks assessed/performed Overall Cognitive Status: Within Functional Limits for tasks assessed                                 General Comments: Pt generally A&O to self, place, situation. Talkative t/o session, but easily re-directable to task/topic at hand.        General Comments      Exercises    Assessment/Plan    PT Assessment Patient needs continued PT services  PT Problem List Decreased strength;Decreased mobility;Decreased safety awareness;Decreased activity tolerance;Cardiopulmonary status limiting activity;Decreased balance;Decreased knowledge of use of DME;Pain       PT Treatment Interventions DME instruction;Therapeutic activities;Therapeutic exercise;Gait training;Patient/family education;Modalities;Stair training;Balance training;Functional mobility training;Neuromuscular re-education;Manual techniques    PT Goals (Current goals can be found in the Care Plan section)  Acute Rehab PT Goals Patient Stated Goal: get better PT Goal  Formulation: With patient Time For Goal Achievement: 02/22/21 Potential to Achieve Goals: Fair    Frequency Min 2X/week   Barriers to discharge Decreased caregiver support;Inaccessible home environment      Co-evaluation               AM-PAC PT "6 Clicks" Mobility  Outcome Measure Help needed turning from your back to your side while in a flat bed without using bedrails?: A Little Help needed moving from lying on your back to sitting on the side of a flat bed without using bedrails?: A Little Help needed moving to and from a bed to a chair (including a wheelchair)?: A Little Help needed standing up from a chair using your arms (e.g., wheelchair or bedside chair)?: A Little Help needed to walk in hospital room?: A Little Help needed climbing 3-5 steps with a railing? : A Lot 6 Click Score: 17    End of Session Equipment Utilized During Treatment: Gait belt;Oxygen Activity Tolerance: Patient tolerated treatment well;Patient limited by pain;Patient limited by fatigue (limited by back pain) Patient left: in chair;with chair alarm set;with call bell/phone within reach Nurse Communication: Mobility status PT Visit Diagnosis: Muscle weakness (generalized) (M62.81);Unsteadiness on feet (R26.81);History of falling (Z91.81)    Time: 5176-1607 PT Time Calculation (min) (ACUTE ONLY): 19 min   Charges:   PT Evaluation $PT Eval Low Complexity: 1 Low PT Treatments $Therapeutic Activity: 8-22 mins        Aleda Grana, PT, DPT 02/08/21, 1:25 PM   [place]  Paul Half 02/08/2021, 1:22 PM

## 2021-02-08 NOTE — Progress Notes (Signed)
[facility] Encounter Note  Patient: Samuel Thomas / Admit Date: 02/06/2021 / Date of Encounter: 02/08/2021, 6:53 AM   Subjective: Patient is feeling much better today than yesterday with better breathing.  Heart rate much better controlled but still remaining in atrial fibrillation at this time with controlled heart rate at 100 bpm.  The patient has had improvements of electrolyte abnormalities with magnesium of 2, potassium of 4.7, and a glomerular filtration rate of 48.  This is appropriate for Tikosyn use and will need to be continue to follow throughout his hospitalization.  He is better from a hypoxia standpoint with no evidence of acute coronary syndrome or congestive heart failure  Review of Systems: Positive for: Shortness of breath Negative for: Vision change, hearing change, syncope, dizziness, nausea, vomiting,diarrhea, bloody stool, stomach pain, cough, congestion, diaphoresis, urinary frequency, urinary pain,skin lesions, skin rashes Others previously listed  Objective: Telemetry: Atrial fibrillation with controlled ventricular rate Physical Exam: Blood pressure 132/66, pulse 95, temperature 97.8 F (36.6 C), resp. rate 20, height 5\' 7"  (1.702 m), weight 81.6 kg, SpO2 96 %. Body mass index is 28.19 kg/m. General: Well developed, well nourished, in no acute distress. Head: Normocephalic, atraumatic, sclera non-icteric, no xanthomas, nares are without discharge. Neck: No apparent masses Lungs: Normal respirations with no wheezes, diffuse rhonchi, no rales , no crackles   Heart: Irregular rate and rhythm, normal S1 S2, no murmur, no rub, no gallop, PMI is normal size and placement, carotid upstroke normal without bruit, jugular venous pressure normal Abdomen: Soft, non-tender, non-distended with normoactive bowel sounds. No hepatosplenomegaly. Abdominal aorta is normal size without bruit Extremities: No edema, no clubbing, no cyanosis, no ulcers,   Peripheral: 2+ radial, 2+ femoral, 2+ dorsal pedal pulses Neuro: Alert and oriented. Moves all extremities spontaneously. Psych:  Responds to questions appropriately with a normal affect.   Intake/Output Summary (Last 24 hours) at 02/08/2021 0653 Last data filed at 02/08/2021 0409 Gross per 24 hour  Intake 934.49 ml  Output 1000 ml  Net -65.51 ml    Inpatient Medications:  . aspirin EC  81 mg Oral Daily  . atorvastatin  20 mg Oral QHS  . budesonide  0.5 mg Nebulization Daily  . diltiazem  240 mg Oral Daily  . dofetilide  125 mcg Oral Q24H  . folic acid  1 mg Oral Daily  . gabapentin  200 mg Oral TID  . insulin aspart  0-9 Units Subcutaneous TID WC  . multivitamin with minerals  1 tablet Oral Daily  . Ensure Max Protein  11 oz Oral BID  . rivaroxaban  15 mg Oral Q supper  . sodium chloride flush  3 mL Intravenous Q12H  . thiamine  100 mg Oral Daily  . tiotropium  18 mcg Inhalation Daily   Infusions:  . azithromycin Stopped (02/07/21 1933)  . cefTRIAXone (ROCEPHIN)  IV Stopped (02/07/21 1811)  . lactated ringers 75 mL/hr at 02/08/21 0459    Labs: Recent Labs    02/07/21 0535 02/07/21 1553 02/08/21 0437  NA 137  --  138  K 3.0*  --  4.7  CL 98  --  106  CO2 25  --  28  GLUCOSE 237*  --  265*  BUN 58*  --  49*  CREATININE 1.54*  --  1.45*  CALCIUM 8.2*  --  8.3*  MG 2.0  --  2.0  PHOS  --  3.1 2.3*   No results for input(s): AST, ALT, ALKPHOS, BILITOT,  PROT, ALBUMIN in the last 72 hours. Recent Labs    02/07/21 0535 02/08/21 0437  WBC 14.1* 10.3  HGB 12.9* 13.0  HCT 39.2 38.9*  MCV 91.6 94.9  PLT 180 171   Recent Labs    02/06/21 1726  CKTOTAL 136   Invalid input(s): POCBNP Recent Labs    02/06/21 2206  HGBA1C 7.1*     Weights: Filed Weights   02/06/21 1727  Weight: 81.6 kg     Radiology/Studies:  CT Head Wo Contrast  Result Date: 02/06/2021 CLINICAL DATA:  Fall with weakness EXAM: CT HEAD WITHOUT CONTRAST TECHNIQUE: Contiguous axial  images were obtained from the base of the skull through the vertex without intravenous contrast. COMPARISON:  CT brain 12/01/2013 FINDINGS: Brain: No acute territorial infarction, hemorrhage, or intracranial mass. Moderate atrophy. Moderate chronic small vessel ischemic changes of the white matter. Mildly prominent ventricles felt secondary to atrophy. Vascular: No hyperdense vessels. Vertebral and carotid vascular calcification Skull: Normal. Negative for fracture or focal lesion. Sinuses/Orbits: No acute finding. Other: None IMPRESSION: 1. No CT evidence for acute intracranial abnormality. 2. Atrophy and chronic small vessel ischemic changes of the white matter Electronically Signed   By: Jasmine Pang M.D.   On: 02/06/2021 18:26   DG Chest Portable 1 View  Result Date: 02/06/2021 CLINICAL DATA:  Weakness EXAM: PORTABLE CHEST 1 VIEW COMPARISON:  12/02/2013 FINDINGS: Consolidation in the right lower lobe compatible with pneumonia. Heart is normal size. Left lung clear. No effusions or acute bony abnormality. IMPRESSION: Right lower lobe pneumonia. Followup PA and lateral chest X-ray is recommended in 3-4 weeks following trial of antibiotic therapy to ensure resolution and exclude underlying malignancy. Electronically Signed   By: Charlett Nose M.D.   On: 02/06/2021 17:53   US THYROID  Result Date: 02/07/2021 CLINICAL DATA:  Elevated serum free T4 level EXAM: THYROID ULTRASOUND TECHNIQUE: Ultrasound examination of the thyroid gland and adjacent soft tissues was performed. COMPARISON:  None. FINDINGS: Parenchymal Echotexture: Moderately heterogenous Isthmus: 0.2 cm thickness Right lobe: 4.2 x 1.8 x 1.8 cm Left lobe: 3.5 x 1.5 x 1.2 cm _________________________________________________________ Estimated total number of nodules >/= 1 cm: 1 Number of spongiform nodules >/=  2 cm not described below (TR1): 0 Number of mixed cystic and solid nodules >/= 1.5 cm not described below (TR2): 0  _________________________________________________________ Nodule 1: 0.9 cm benign septated cyst, mid right Nodule # 2: Location: Right; inferior Maximum size: 1.1 cm; Other 2 dimensions: 1.1 x 0.7 cm Composition: solid/almost completely solid (2) Echogenicity: hypoechoic (2) Shape: not taller-than-wide (0) Margins: smooth (0) Echogenic foci: macrocalcifications (1) ACR TI-RADS total points: 5. ACR TI-RADS risk category: TR4 (4-6 points). ACR TI-RADS recommendations: *Given size (>/= 1 - 1.4 cm) and appearance, a follow-up ultrasound in 1 year should be considered based on TI-RADS criteria. _________________________________________________________ Nodule 3: 0.7 cm complex cyst, mid left; This nodule does NOT meet TI-RADS criteria for biopsy or dedicated follow-up. No regional cervical adenopathy identified. IMPRESSION: 1. Normal-sized thyroid with bilateral nodules as above. None meets criteria for biopsy. 2. Recommend annual/biennial ultrasound follow-up of inferior right nodule as above, until stability x5 years confirmed. The above is in keeping with the ACR TI-RADS recommendations - J Am Coll Radiol 2017;14:587-595. Electronically Signed   By: Corlis Leak M.D.   On: 02/07/2021 12:33     Assessment and Recommendation  85 y.o. male with acute paroxysmal nonvalvular atrial fibrillation with rapid ventricular rate due to right lower lobe pneumonia with chronic kidney  disease cough and congestion and no current evidence of congestive heart failure or acute coronary syndrome 1.  Continuation of Tikosyn for possible spontaneous conversion to normal rhythm and hopeful maintenance of normal rhythm thereafter but will need continuation of close analysis of magnesium above 2, potassium above 4, and glomerular filtration rate above 30. 2.  Continuation of diltiazem 240 mg CD for heart rate control of atrial fibrillation and possible spontaneous conversion to normal rhythm 3.  No further cardiac diagnostics necessary at  this time 4.  Continue supportive care and treatment of pneumonia likely exacerbating above 5.  Begin ambulation and follow improvements of symptoms  Signed, Arnoldo Hooker M.D. FACC

## 2021-02-08 NOTE — Progress Notes (Signed)
[facility] Progress Note  Patient: Samuel Thomas    TDD:[id number]  DOA: 02/06/2021     Date of Service: the patient was seen and examined on 02/08/2021  No chief complaint on file.  Brief hospital course: Samuel Thomas is a 85 y.o. male with PMH of COPD. DMT2, HTN, Hearing loss, pulmonary HTN,  seen in ed with complaints of fall. PT had a fall today between 3-5 pm, and then his neighbor called his nephew and he called ems. Pt is alert,awake and oriented but is his baseline.  Pt states before hsi fall he was dizzy, and weak and his legs felt like water. It has happened before four days ago when he fell and was dizzy. Pt denies any eyes ears, nose or speech or gait issues. Pt states he is weak in his legs. At home has not been abel to do ADL. Daughter at bedside pt has been declining for past few weeks.  Pt has h/o c/h a/fib for 20 year, and takes [place]. Pt also takes blood thinner but does not know name, then pt states he take eliquis and xarelto both. Once med rec is done we can cont with the correct medication.      Assessment and Plan: Principal Problem:   Fall Active Problems:   Tobacco abuse   Generalized weakness   Atrial fibrillation with RVR (HCC)   Essential hypertension   COPD (chronic obstructive pulmonary disease) (HCC)   Diabetes mellitus (HCC)   A.FIB rvr: S/p Cardizem IV infusion and transitioned to Cardizem CD 240 mg daily as per cardiology Cont asa and tiokosdyn. Resumed Xarelto, Eliquis will be discontinued on discharge.   Diastolic heart failure: 2 d echo LVEF 55 to 60%, no regional wall motion abnormality, Cont  tikosyn, asa, and lasix to be ordered Prn while monitoring weight , electrolyte  and kidney function.  Cardiology consult appreciated  Hypokalemic, potassium repleted Hypophosphatemia, phos repleted.   Fall/ dizziness: We will admit to telemetry unit ad obtain orthostatic vitals.  We will cut back his bp meds and diabetes meds on hold so  we can do SSI/ Glycemic protocol. Fall precaution// aspiration precaution.  MIVF.  Check orthostatic BP   Community-acquired pneumonia, Leukocytosis, chest x-ray consistent with right lower lobe pneumonia Patient received ceftriaxone azithromycin in the ED which has been continued Continue Mucinex twice daily, Robitussin-DM as needed  Abnormal thyroid functions, TSH at lower end, slightly elevated FT4 level Follow free T3 Thyroid US 1. Normal-sized thyroid with bilateral nodules as above. None meets criteria for biopsy. 2. Recommend annual/biennial ultrasound follow-up of inferior right nodule as above, until stability x5 years confirmed. Patient may need to be treated for hypothyroid, will recheck free T4 level tomorrow a.m.   AKI on CKD stage III Creatinine  1.9--1.5 gradually improving Continue IV fluid for hydration   Tobacco abuse: Nicotine patch.    Generalized weakness: PT consult.      HTN: S/p Cardizem IV infusion, started Cardizem CD  We will continue monitor BP and titrate medications accordingly   COPD: Cont nebulizer and inhalers.    DM II; HbA1c 7.1, well controlled Held home medications for now Continue moderate sliding scale, monitor FSBG Continue diabetic diet  Iron deficiency, iron saturation 6%, started oral iron supplement.  Follow with PCP to repeat iron profile after 3 to 6 months.  B12 and folic acid within normal range   PT/OT eval done, recommend SNF placement  Body mass index is 28.19 kg/m.  Nutrition  Problem: Increased nutrient needs Etiology: chronic illness (COPD) Interventions:       Diet: Carb modified diet DVT Prophylaxis: Therapeutic Anticoagulation with Xarelto    Advance goals of care discussion: DNR  Family Communication: family was not present at bedside, at the time of interview.  The pt provided permission to discuss medical plan with the family. Opportunity was given to ask question and all questions were answered  satisfactorily.   Disposition:  Pt is from Home, admitted with fall, PNA and A. fib with RVR, still has generalized weakness, at risk for fall, which precludes a safe discharge. Discharge to SNF,  when stable.  Subjective: No significant events overnight, patient still having some cough with phlegm production, denied any chest pain or palpitations, patient did ambulate with physical therapy.  No any other active issues today.   Physical Exam: General:  alert oriented to time, place, and person.  Appear in no distress, affect appropriate Eyes: PERRLA ENT: Oral Mucosa Clear, moist  Neck: no JVD,  Cardiovascular: S1 and S2 Present, no Murmur,  Respiratory: good respiratory effort, Bilateral Air entry equal and Decreased, no Crackles, no wheezes Abdomen: Bowel Sound present, Soft and no tenderness,  Skin: no rashes Extremities: no Pedal edema, no calf tenderness Neurologic: without any new focal findings Gait not checked due to patient safety concerns  Vitals:   02/08/21 0750 02/08/21 0816 02/08/21 0930 02/08/21 1128  BP: 123/61   (!) 117/53  Pulse: 95   93  Resp: 18   18  Temp: 97.8 F (36.6 C)   98.1 F (36.7 C)  TempSrc:      SpO2: 98% 96% 100% 96%  Weight:      Height:        Intake/Output Summary (Last 24 hours) at 02/08/2021 1321 Last data filed at 02/08/2021 1101 Gross per 24 hour  Intake 1796.47 ml  Output 1000 ml  Net 796.47 ml   Filed Weights   02/06/21 1727  Weight: 81.6 kg    Data Reviewed: I have personally reviewed and interpreted daily labs, tele strips, imagings as discussed above. I reviewed all nursing notes, pharmacy notes, vitals, pertinent old records I have discussed plan of care as described above with RN and patient/family.  CBC: Recent Labs  Lab 02/06/21 1726 02/07/21 0535 02/08/21 0437  WBC 12.4* 14.1* 10.3  HGB 13.7 12.9* 13.0  HCT 39.4 39.2 38.9*  MCV 93.4 91.6 94.9  PLT 159 180 171   Basic Metabolic Panel: Recent Labs  Lab  02/06/21 1726 02/07/21 0535 02/07/21 1553 02/08/21 0437  NA 139 137  --  138  K 3.5 3.0*  --  4.7  CL 100 98  --  106  CO2 27 25  --  28  GLUCOSE 260* 237*  --  265*  BUN 70* 58*  --  49*  CREATININE 1.90* 1.54*  --  1.45*  CALCIUM 8.3* 8.2*  --  8.3*  MG  --  2.0  --  2.0  PHOS  --   --  3.1 2.3*    Studies: ECHOCARDIOGRAM COMPLETE  Result Date: 02/08/2021    ECHOCARDIOGRAM REPORT   Patient Name:   Samuel Thomas Date of Exam: 02/07/2021 Medical Rec #:  [id number]    Height:       67.0 in Accession #:    [id number]   Weight:       180.0 lb Date of Birth:  09/10/1935    BSA:  1.934 m Patient Age:    85 years     BP:           104/69 mmHg Patient Gender: M            HR:           63 bpm. Exam Location:  [facility] Procedure: 2D Echo Indications:     Atrial Fibrillation I48.91  History:         Patient has no prior history of Echocardiogram examinations.                  Risk Factors:Hypertension, Diabetes and Dyslipidemia.  Sonographer:     Johnathan Hausen Referring Phys:  [id number] UTML Y PATEL Diagnosing Phys: Arnoldo Hooker MD  Sonographer Comments: Technically difficult study due to poor echo windows, suboptimal parasternal window and no apical window. Image acquisition challenging due to patient body habitus. IMPRESSIONS  1. Left ventricular ejection fraction, by estimation, is 55 to 60%. The left ventricle has normal function. The left ventricle has no regional wall motion abnormalities. Left ventricular diastolic function could not be evaluated.  2. Right ventricular systolic function is normal. The right ventricular size is normal.  3. Left atrial size was mildly dilated.  4. The mitral valve is normal in structure. Mild mitral valve regurgitation.  5. The aortic valve is grossly normal. Aortic valve regurgitation is not visualized. FINDINGS  Left Ventricle: Left ventricular ejection fraction, by estimation, is 55 to 60%. The left ventricle has normal function. The left ventricle has no  regional wall motion abnormalities. The left ventricular internal cavity size was normal in size. There is  no left ventricular hypertrophy. Left ventricular diastolic function could not be evaluated. Right Ventricle: The right ventricular size is normal. No increase in right ventricular wall thickness. Right ventricular systolic function is normal. Left Atrium: Left atrial size was mildly dilated. Right Atrium: Right atrial size was normal in size. Pericardium: There is no evidence of pericardial effusion. Mitral Valve: The mitral valve is normal in structure. Mild mitral valve regurgitation. Tricuspid Valve: The tricuspid valve is normal in structure. Tricuspid valve regurgitation is trivial. Aortic Valve: The aortic valve is grossly normal. Aortic valve regurgitation is not visualized. Pulmonic Valve: The pulmonic valve was not assessed. Pulmonic valve regurgitation is not visualized. Aorta: The aortic root and ascending aorta are structurally normal, with no evidence of dilitation. IAS/Shunts: The interatrial septum was not assessed.  LEFT VENTRICLE PLAX 2D LVIDd:         4.70 cm LVIDs:         3.30 cm LV PW:         1.20 cm LV IVS:        0.70 cm  LEFT ATRIUM         Index LA diam:    3.70 cm 1.91 cm/m  TRICUSPID VALVE TR Peak grad:   30.5 mmHg TR Vmax:        276.00 cm/s Arnoldo Hooker MD Electronically signed by Arnoldo Hooker MD Signature Date/Time: 02/08/2021/7:10:44 AM    Final     Scheduled Meds:  vitamin C  500 mg Oral Daily   aspirin EC  81 mg Oral Daily   atorvastatin  20 mg Oral QHS   budesonide  0.5 mg Nebulization Daily   diltiazem  240 mg Oral Daily   dofetilide  125 mcg Oral Q24H   folic acid  1 mg Oral Daily   gabapentin  200 mg Oral TID  insulin aspart  0-9 Units Subcutaneous TID WC   iron polysaccharides  150 mg Oral Daily   multivitamin with minerals  1 tablet Oral Daily   Ensure Max Protein  11 oz Oral BID   rivaroxaban  15 mg Oral Q supper   sodium chloride flush  3 mL  Intravenous Q12H   thiamine  100 mg Oral Daily   tiotropium  18 mcg Inhalation Daily   Continuous Infusions:  azithromycin Stopped (02/07/21 1933)   cefTRIAXone (ROCEPHIN)  IV Stopped (02/07/21 1811)   lactated ringers Stopped (02/08/21 1143)   PRN Meds: acetaminophen **OR** acetaminophen, albuterol, bisacodyl, hydrALAZINE, ipratropium-albuterol, ondansetron **OR** ondansetron (ZOFRAN) IV, polyethylene glycol  Time spent: 35 minutes  Author: Gillis Santa. MD [facility] 02/08/2021 1:21 PM  To reach On-call, see care teams to locate the attending and reach out to them via [email or web address]. If 7PM-7AM, please contact night-coverage If you still have difficulty reaching the attending provider, please page the [facility] (Director on Call) for [facility] on amion for assistance.

## 2021-02-09 DIAGNOSIS — I4891 Unspecified atrial fibrillation: Secondary | ICD-10-CM

## 2021-02-09 LAB — BASIC METABOLIC PANEL
Anion gap: 9 (ref 5–15)
BUN: 40 mg/dL — ABNORMAL HIGH (ref 8–23)
CO2: 26 mmol/L (ref 22–32)
Calcium: 8.5 mg/dL — ABNORMAL LOW (ref 8.9–10.3)
Chloride: 103 mmol/L (ref 98–111)
Creatinine, Ser: 1.14 mg/dL (ref 0.61–1.24)
GFR, Estimated: >60 mL/min (ref >60)
Glucose, Bld: 288 mg/dL — ABNORMAL HIGH (ref 70–99)
Potassium: 3.8 mmol/L (ref 3.5–5.1)
Sodium: 138 mmol/L (ref 135–145)

## 2021-02-09 LAB — CBC
HCT: 38.7 % — ABNORMAL LOW (ref 39.0–52.0)
Hemoglobin: 12.8 g/dL — ABNORMAL LOW (ref 13.0–17.0)
MCH: 30.5 pg (ref 26.0–34.0)
MCHC: 33.1 g/dL (ref 30.0–36.0)
MCV: 92.4 fL (ref 80.0–100.0)
Platelets: 197 10*3/uL (ref 150–400)
RBC: 4.19 MIL/uL — ABNORMAL LOW (ref 4.22–5.81)
RDW: 13.6 % (ref 11.5–15.5)
WBC: 8.5 10*3/uL (ref 4.0–10.5)
nRBC: 0 % (ref 0.0–0.2)

## 2021-02-09 LAB — PHOSPHORUS: Phosphorus: 2.9 mg/dL (ref 2.5–4.6)

## 2021-02-09 LAB — MAGNESIUM: Magnesium: 1.9 mg/dL (ref 1.7–2.4)

## 2021-02-09 LAB — GLUCOSE, CAPILLARY
Glucose-Capillary: 269 mg/dL — ABNORMAL HIGH (ref 70–99)
Glucose-Capillary: 338 mg/dL — ABNORMAL HIGH (ref 70–99)
Glucose-Capillary: 280 mg/dL — ABNORMAL HIGH (ref 70–99)

## 2021-02-09 MED ORDER — POTASSIUM CHLORIDE CRYS ER 20 MEQ PO TBCR
40.0000 meq | EXTENDED_RELEASE_TABLET | ORAL | Status: AC
Start: 2021-02-09 — End: 2021-02-09
  Administered 2021-02-09 (×2): 40 meq via ORAL
  Filled 2021-02-09 (×2): qty 4

## 2021-02-09 NOTE — [place] FL2 (Signed)
[place] MEDICAID FL2 LEVEL OF CARE SCREENING TOOL     IDENTIFICATION  Patient Name: Samuel Thomas Birthdate: [birth year] Sex: male Admission Date (Current Location): 02/06/2021  [facility] and [place] Number:  Chiropodist and Address:  [facility], [address] [place], [place] [address]      Provider Number: [id number]  Attending Physician Name and Address:  Gillis Santa, MD  Relative Name and Phone Number:  Michel Harrow)   [phone] [facility])    Current Level of Care: Hospital Recommended Level of Care: Skilled Nursing Facility Prior Approval Number:    Date Approved/Denied:   PASRR Number: [id number]  Discharge Plan:      Current Diagnoses: Patient Active Problem List   Diagnosis Date Noted   Fall 02/06/2021   Generalized weakness 02/06/2021   Tobacco abuse 02/06/2021   Pain due to onychomycosis of toenails of both feet 05/20/2020   History of gout 04/02/2020   Acute encephalopathy 10/27/2017   Syncope and collapse 10/27/2017   Incomplete emptying of bladder 07/20/2017   Overactive detrusor 07/20/2017   Atrial fibrillation with RVR (HCC) 07/01/2017   Hearing loss, sensorineural, asymmetrical 03/09/2017   At risk for falls 03/24/2016   Polypharmacy 03/24/2016   Benign fibroma of prostate 09/10/2015   Diastolic heart failure (HCC) 08/16/2015   Pulmonary hypertension (HCC) 08/16/2015   Cough 08/15/2015   Breath shortness 08/15/2015   BP (high blood pressure) 07/24/2015   BPH with obstruction/lower urinary tract symptoms 07/02/2015   Diverticulitis large intestine 01/03/2015   Injury of kidney 01/02/2015   N&V (nausea and vomiting) 01/02/2015   Chronic pain associated with significant psychosocial dysfunction 10/04/2014   Chronic pain syndrome 10/04/2014   Encounter for monitoring opioid maintenance therapy 08/29/2014   Arthritis 07/24/2014   Venous stasis ulcer of lower extremity (HCC) 07/24/2014   Chronic  pain of right knee 07/03/2014   Diabetes mellitus (HCC) 05/17/2014   Obstructive apnea 04/25/2014   Drug-induced constipation 04/03/2014   Arthritis of knee, degenerative 01/30/2014   Bacterial skin infection of leg 01/16/2014   Chronic diastolic heart failure (HCC) 01/16/2014   Edema leg 01/16/2014   Cellulitis of lower extremity 01/16/2014   Chronic combined systolic and diastolic heart failure (HCC) 01/02/2014   Acute diastolic heart failure (HCC) 12/02/2013   NSVT (nonsustained ventricular tachycardia) 12/01/2013   SIRS (systemic inflammatory response syndrome) (HCC) 12/01/2013   Altered mental status 12/01/2013   Hypokalemia 12/01/2013   DM2 (diabetes mellitus, type 2) (HCC) 12/01/2013   HTN (hypertension) 12/01/2013   Hypoxia 12/01/2013   Chronic, continuous use of opioids 09/18/2013   Pain in shoulder 07/21/2013   Pain of right hand 05/24/2013   Bilateral hand pain 05/24/2013   Other long term (current) drug therapy 01/03/2013   High risk medication use 01/03/2013   Muscle ache 11/23/2012   Suicidal ideation 11/23/2012   Cellulitis 09/25/2012   COPD (chronic obstructive pulmonary disease) (HCC) 09/25/2012   Apnea, sleep 09/25/2012   Diabetes mellitus type 2 in obese (HCC) 08/15/2012   Hook nail 08/15/2012   Fungal infection of nail 08/15/2012   Arthropathy of lumbar facet joint 07/08/2012   Nerve root pain 07/07/2012   Cannabis abuse 07/07/2012   Marijuana use 07/07/2012   Status post cataract extraction 06/16/2012   Artificial lens present 06/16/2012   Other specified health status 05/10/2012   Depression 05/10/2012   Alcohol use 05/10/2012   Lumbar canal stenosis 03/17/2012   Degenerative arthritis of hip 03/17/2012   Adiposity  03/17/2012   H/O arthrodesis 03/17/2012   S/P cervical spinal fusion 03/17/2012   Arthralgia of hip or thigh 01/12/2012   Lumbosacral spondylosis 12/10/2011   Pain in thoracic spine 12/10/2011   Brachial neuritis 10/27/2011    Hypercholesteremia 08/25/2011   Hypercholesterolemia 08/25/2011   Cervical spinal stenosis 07/27/2011   Cervical spondylosis with myelopathy 07/17/2011   Difficulty walking 07/13/2011   Thoracic and lumbosacral neuritis 07/13/2011   Diverticulosis of colon 05/25/2011   History of colonic polyps 05/25/2011   Diverticular disease of large intestine 05/25/2011   Astigmatism 04/10/2011   Cataract, nuclear sclerotic senile 04/10/2011   Diabetes mellitus type 2, uncontrolled 04/10/2011   Bilateral low back pain without sciatica 03/31/2011   Allergic rhinitis 06/20/2002   Asthma 06/20/2002   Moderate persistent asthma without complication 06/20/2002   Coronary atherosclerosis 06/17/2000   Essential hypertension 06/17/2000    Orientation RESPIRATION BLADDER Height & Weight     Self, Time, Situation, Place  Normal External catheter Weight: 180 lb (81.6 kg) Height:  5\' 7"  (170.2 cm)  BEHAVIORAL SYMPTOMS/MOOD NEUROLOGICAL BOWEL NUTRITION STATUS        Diet (carb modified; thin liquids)  AMBULATORY STATUS COMMUNICATION OF NEEDS Skin   Limited Assist Verbally Normal                       Personal Care Assistance Level of Assistance  Feeding, Bathing, Dressing Bathing Assistance: Limited assistance Feeding assistance: Independent Dressing Assistance: Limited assistance     Functional Limitations Info             SPECIAL CARE FACTORS FREQUENCY  PT (By licensed PT), OT (By licensed OT)     PT Frequency: 5 times per week OT Frequency: 5 times per week            Contractures      Additional Factors Info  Code Status, Allergies Code Status Info: DNR Allergies Info: Penicillins, Apixaban, Prednisone           Current Medications (02/09/2021):  This is the current hospital active medication list Current Facility-Administered Medications  Medication Dose Route Frequency Provider Last Rate Last Admin   acetaminophen (TYLENOL) tablet 650 mg  650 mg Oral Q6H PRN  02/11/2021, MD       Or   acetaminophen (TYLENOL) suppository 650 mg  650 mg Rectal Q6H PRN Gertha Calkin, MD       albuterol (PROVENTIL) (2.5 MG/3ML) 0.083% nebulizer solution 2.5 mg  2.5 mg Nebulization Q2H PRN Gertha Calkin, MD       ascorbic acid (VITAMIN C) tablet 500 mg  500 mg Oral Daily Gertha Calkin, MD   500 mg at 02/09/21 02/11/21   aspirin EC tablet 81 mg  81 mg Oral Daily 7353 V, MD   81 mg at 02/09/21 0851   atorvastatin (LIPITOR) tablet 20 mg  20 mg Oral QHS 02/11/21 V, MD   20 mg at 02/08/21 2112   azithromycin (ZITHROMAX) 500 mg in sodium chloride 0.9 % 250 mL IVPB  500 mg Intravenous Q24H 2113, MD   Stopped at 02/08/21 1854   bisacodyl (DULCOLAX) EC tablet 5 mg  5 mg Oral Daily PRN 02/10/21, MD       budesonide (PULMICORT) nebulizer solution 0.5 mg  0.5 mg Nebulization Daily Gertha Calkin V, MD   0.5 mg at 02/09/21 0813   cefTRIAXone (ROCEPHIN) 1 g in sodium chloride 0.9 % 100 mL IVPB  1 g Intravenous Q24H Gillis Santa, MD   Stopped at 02/08/21 1723   diltiazem (CARDIZEM CD) 24 hr capsule 240 mg  240 mg Oral Daily Gillis Santa, MD   240 mg at 02/09/21 0851   dofetilide (TIKOSYN) capsule 125 mcg  125 mcg Oral Q24H Gertha Calkin, MD   125 mcg at 02/09/21 2458   folic acid (FOLVITE) tablet 1 mg  1 mg Oral Daily Gillis Santa, MD   1 mg at 02/09/21 0998   gabapentin (NEURONTIN) capsule 200 mg  200 mg Oral TID Gillis Santa, MD   200 mg at 02/09/21 0851   guaiFENesin (MUCINEX) 12 hr tablet 600 mg  600 mg Oral BID Gillis Santa, MD   600 mg at 02/09/21 0851   guaiFENesin-dextromethorphan (ROBITUSSIN DM) 100-10 MG/5ML syrup 10 mL  10 mL Oral Q4H PRN Gillis Santa, MD       hydrALAZINE (APRESOLINE) injection 5 mg  5 mg Intravenous Q4H PRN Gertha Calkin, MD       insulin aspart (novoLOG) injection 0-9 Units  0-9 Units Subcutaneous TID WC Irena Cords V, MD   5 Units at 02/09/21 0851   ipratropium-albuterol (DUONEB) 0.5-2.5 (3) MG/3ML nebulizer solution 3 mL  3 mL  Inhalation Q6H PRN Gertha Calkin, MD       iron polysaccharides (NIFEREX) capsule 150 mg  150 mg Oral Daily Gillis Santa, MD   150 mg at 02/09/21 3382   lactated ringers infusion   Intravenous Continuous Gertha Calkin, MD 75 mL/hr at 02/09/21 0334 Infusion Verify at 02/09/21 0334   multivitamin with minerals tablet 1 tablet  1 tablet Oral Daily Gillis Santa, MD   1 tablet at 02/08/21 1205   ondansetron (ZOFRAN) tablet 4 mg  4 mg Oral Q6H PRN Gertha Calkin, MD       Or   ondansetron (ZOFRAN) injection 4 mg  4 mg Intravenous Q6H PRN Gertha Calkin, MD       polyethylene glycol (MIRALAX / GLYCOLAX) packet 17 g  17 g Oral Daily PRN Gertha Calkin, MD       protein supplement (ENSURE MAX) liquid  11 oz Oral BID Gillis Santa, MD   11 oz at 02/08/21 1029   Rivaroxaban (XARELTO) tablet 15 mg  15 mg Oral Q supper Sharen Hones, RPH   15 mg at 02/08/21 1721   sodium chloride flush (NS) 0.9 % injection 3 mL  3 mL Intravenous Q12H Irena Cords V, MD   3 mL at 02/09/21 0855   thiamine tablet 100 mg  100 mg Oral Daily Gillis Santa, MD   100 mg at 02/09/21 0851   tiotropium [facility]) inhalation capsule ([facility] use ONLY) 18 mcg  18 mcg Inhalation Daily Gertha Calkin, MD   18 mcg at 02/09/21 5053     Discharge Medications: Please see discharge summary for a list of discharge medications.  Relevant Imaging Results:  Relevant Lab Results:   Additional Information SS #: [id number]  Selby Slovacek E Damiel Barthold, LCSW

## 2021-02-09 NOTE — Progress Notes (Signed)
[facility] Progress Note  Samuel Thomas: Samuel Thomas    WGN:[id number]  DOA: 02/06/2021     Date of Service: the Samuel Thomas was seen and examined on 02/09/2021  No chief complaint on file.  Brief hospital course: Samuel Thomas is a 85 y.o. male with PMH of COPD. DMT2, HTN, Hearing loss, pulmonary HTN,  seen in ed with complaints of fall. Samuel Thomas had a fall today between 3-5 pm, and then his neighbor called his nephew and he called ems. Samuel Thomas is alert,awake and oriented but is his baseline.  Samuel Thomas states before hsi fall he was dizzy, and weak and his legs felt like water. It has happened before four days ago when he fell and was dizzy. Samuel Thomas denies any eyes ears, nose or speech or gait issues. Samuel Thomas states he is weak in his legs. At home has not been abel to do ADL. Daughter at bedside Samuel Thomas has been declining for past few weeks.  Samuel Thomas has h/o c/h a/fib for 20 year, and takes [place]. Samuel Thomas also takes blood thinner but does not know name, then Samuel Thomas states he take eliquis and xarelto both. Once med rec is done we can cont with the correct medication.      Assessment and Plan: Principal Problem:   Fall Active Problems:   Tobacco abuse   Generalized weakness   Atrial fibrillation with RVR (HCC)   Essential hypertension   COPD (chronic obstructive pulmonary disease) (HCC)   Diabetes mellitus (HCC)   A.FIB rvr: S/p Cardizem IV infusion and transitioned to Cardizem CD 240 mg daily as per cardiology Cont asa and tiokosdyn. Resumed Xarelto, Eliquis will be discontinued on discharge.   Diastolic heart failure: 2 d echo LVEF 55 to 60%, no regional wall motion abnormality, Cont  tikosyn, asa, and lasix to be ordered Prn while monitoring weight , electrolyte  and kidney function.  Cardiology consult appreciated  Hypokalemic, potassium repleted Hypophosphatemia, phos repleted.   Fall/ dizziness: We will admit to telemetry unit ad obtain orthostatic vitals.  We will cut back his bp meds and diabetes meds on hold so  we can do SSI/ Glycemic protocol. Fall precaution// aspiration precaution.  MIVF.  Check orthostatic BP   Community-acquired pneumonia, Leukocytosis, chest x-ray consistent with right lower lobe pneumonia Samuel Thomas received ceftriaxone azithromycin in the ED which has been continued Continue Mucinex twice daily, Robitussin-DM as needed  Abnormal thyroid functions, TSH at lower end, slightly elevated FT4 level Follow free T3 Thyroid US 1. Normal-sized thyroid with bilateral nodules as above. None meets criteria for biopsy. 2. Recommend annual/biennial ultrasound follow-up of inferior right nodule as above, until stability x5 years confirmed. Samuel Thomas may need to be treated for hypothyroid, will recheck free T4 level tomorrow a.m.   AKI on CKD stage III Creatinine  1.9--1.5--1.14 gradually improving Continue IV fluid for hydration   Tobacco abuse: Nicotine patch.    Generalized weakness: Samuel Thomas consult.      HTN: S/p Cardizem IV infusion, started Cardizem CD  We will continue monitor BP and titrate medications accordingly   COPD: Cont nebulizer and inhalers.    DM II; HbA1c 7.1, well controlled Held home medications for now Continue moderate sliding scale, monitor FSBG Continue diabetic diet  Iron deficiency, iron saturation 6%, started oral iron supplement.  Follow with PCP to repeat iron profile after 3 to 6 months.  B12 and folic acid within normal range   Samuel Thomas/OT eval done, recommend SNF placement  Body mass index is 28.19 kg/m.  Nutrition  Problem: Increased nutrient needs Etiology: chronic illness (COPD) Interventions:       Diet: Carb modified diet DVT Prophylaxis: Therapeutic Anticoagulation with Xarelto    Advance goals of care discussion: DNR  Family Communication: family was not present at bedside, at the time of interview.  The Samuel Thomas provided permission to discuss medical plan with the family. Opportunity was given to ask question and all questions were  answered satisfactorily.   Disposition:  Samuel Thomas is from Home, admitted with fall, PNA and A. fib with RVR, still has generalized weakness, at risk for fall, which precludes a safe discharge. Discharge to SNF,  when bed is available, medically optimized to discharge  Subjective: No significant events overnight, Samuel Thomas was uncomfortable in the bed, stated that he is feeling fine just feels weak and tired.  No any other active issues.  Denies any chest pain or palpitations.   Physical Exam: General:  alert oriented to time, place, and person.  Appear in no distress, affect appropriate Eyes: PERRLA ENT: Oral Mucosa Clear, moist  Neck: no JVD,  Cardiovascular: S1 and S2 Present, no Murmur,  Respiratory: good respiratory effort, Bilateral Air entry equal and Decreased, mild crackles, no significant wheezes Abdomen: Bowel Sound present, Soft and no tenderness,  Skin: no rashes Extremities: no Pedal edema, no calf tenderness Neurologic: without any new focal findings Gait not checked due to Samuel Thomas safety concerns  Vitals:   02/09/21 0339 02/09/21 0808 02/09/21 0815 02/09/21 1103  BP: 134/61 119/75  115/78  Pulse: 92 (!) 103  (!) 105  Resp: 20 17  17   Temp: (!) 97.5 F (36.4 C) 98 F (36.7 C)  98.7 F (37.1 C)  TempSrc:      SpO2: 92% 92% 92% 96%  Weight:      Height:        Intake/Output Summary (Last 24 hours) at 02/09/2021 1403 Last data filed at 02/09/2021 1014 Gross per 24 hour  Intake 1807.17 ml  Output 1600 ml  Net 207.17 ml   Filed Weights   02/06/21 1727  Weight: 81.6 kg    Data Reviewed: I have personally reviewed and interpreted daily labs, tele strips, imagings as discussed above. I reviewed all nursing notes, pharmacy notes, vitals, pertinent old records I have discussed plan of care as described above with RN and Samuel Thomas/family.  CBC: Recent Labs  Lab 02/06/21 1726 02/07/21 0535 02/08/21 0437 02/09/21 0456  WBC 12.4* 14.1* 10.3 8.5  HGB 13.7 12.9*  13.0 12.8*  HCT 39.4 39.2 38.9* 38.7*  MCV 93.4 91.6 94.9 92.4  PLT 159 180 171 197   Basic Metabolic Panel: Recent Labs  Lab 02/06/21 1726 02/07/21 0535 02/07/21 1553 02/08/21 0437 02/09/21 0456  NA 139 137  --  138 138  K 3.5 3.0*  --  4.7 3.8  CL 100 98  --  106 103  CO2 27 25  --  28 26  GLUCOSE 260* 237*  --  265* 288*  BUN 70* 58*  --  49* 40*  CREATININE 1.90* 1.54*  --  1.45* 1.14  CALCIUM 8.3* 8.2*  --  8.3* 8.5*  MG  --  2.0  --  2.0 1.9  PHOS  --   --  3.1 2.3* 2.9    Studies: No results found.  Scheduled Meds:  vitamin C  500 mg Oral Daily   aspirin EC  81 mg Oral Daily   atorvastatin  20 mg Oral QHS   budesonide  0.5 mg Nebulization Daily  diltiazem  240 mg Oral Daily   dofetilide  125 mcg Oral Q24H   folic acid  1 mg Oral Daily   gabapentin  200 mg Oral TID   guaiFENesin  600 mg Oral BID   insulin aspart  0-9 Units Subcutaneous TID WC   iron polysaccharides  150 mg Oral Daily   multivitamin with minerals  1 tablet Oral Daily   potassium chloride  40 mEq Oral Q4H   Ensure Max Protein  11 oz Oral BID   rivaroxaban  15 mg Oral Q supper   sodium chloride flush  3 mL Intravenous Q12H   thiamine  100 mg Oral Daily   tiotropium  18 mcg Inhalation Daily   Continuous Infusions:  azithromycin Stopped (02/08/21 1854)   cefTRIAXone (ROCEPHIN)  IV Stopped (02/08/21 1723)   lactated ringers 75 mL/hr at 02/09/21 0334   PRN Meds: acetaminophen **OR** acetaminophen, albuterol, bisacodyl, guaiFENesin-dextromethorphan, hydrALAZINE, ipratropium-albuterol, ondansetron **OR** ondansetron (ZOFRAN) IV, polyethylene glycol  Time spent: 35 minutes  Author: Gillis Santa. MD [facility] 02/09/2021 2:03 PM  To reach On-call, see care teams to locate the attending and reach out to them via [email or web address]. If 7PM-7AM, please contact night-coverage If you still have difficulty reaching the attending provider, please page the [facility] (Director on Call) for [facility] on amion for assistance.

## 2021-02-09 NOTE — TOC Progression Note (Signed)
Transition of Care [facility]) - Progression Note    Patient Details  Name: Samuel Thomas MRN: [id number] Date of Birth: 04-25-1935  Transition of Care [facility]) CM/SW [place], LCSW Phone Number: 02/09/2021, 10:31 AM  Clinical Narrative:   Met with patient at bedside. Patient agreeable to SNF. Says he has had 2 COVID shots and 2 boosters. Prefers a SNF near his Cardiologist office, did not have specific SNF in mind. Workup started.    Expected Discharge Plan: Home/Self Care Barriers to Discharge: Continued Medical Work up  Expected Discharge Plan and Services Expected Discharge Plan: Home/Self Care       Living arrangements for the past 2 months: Single Family Home                                       Social Determinants of Health (SDOH) Interventions    Readmission Risk Interventions Readmission Risk Prevention Plan 02/08/2021  Transportation Screening Complete  Medication Review Press photographer) Complete  PCP or Specialist appointment within 3-5 days of discharge Complete  HRI or [place] Complete  SW Recovery Care/Counseling Consult Complete  Palliative Care Screening Not [place] Complete  Some recent data might be hidden

## 2021-02-10 ENCOUNTER — Inpatient Hospital Stay: Payer: Medicare Other

## 2021-02-10 DIAGNOSIS — I4891 Unspecified atrial fibrillation: Secondary | ICD-10-CM

## 2021-02-10 LAB — CBC
HCT: 38.3 % — ABNORMAL LOW (ref 39.0–52.0)
Hemoglobin: 12.6 g/dL — ABNORMAL LOW (ref 13.0–17.0)
MCH: 30.5 pg (ref 26.0–34.0)
MCHC: 32.9 g/dL (ref 30.0–36.0)
MCV: 92.7 fL (ref 80.0–100.0)
Platelets: 221 10*3/uL (ref 150–400)
RBC: 4.13 MIL/uL — ABNORMAL LOW (ref 4.22–5.81)
RDW: 13.5 % (ref 11.5–15.5)
WBC: 8.6 10*3/uL (ref 4.0–10.5)
nRBC: 0 % (ref 0.0–0.2)

## 2021-02-10 LAB — BASIC METABOLIC PANEL
Anion gap: 8 (ref 5–15)
BUN: 28 mg/dL — ABNORMAL HIGH (ref 8–23)
CO2: 25 mmol/L (ref 22–32)
Calcium: 8.5 mg/dL — ABNORMAL LOW (ref 8.9–10.3)
Chloride: 103 mmol/L (ref 98–111)
Creatinine, Ser: 1 mg/dL (ref 0.61–1.24)
GFR, Estimated: >60 mL/min (ref >60)
Glucose, Bld: 302 mg/dL — ABNORMAL HIGH (ref 70–99)
Potassium: 4.6 mmol/L (ref 3.5–5.1)
Sodium: 136 mmol/L (ref 135–145)

## 2021-02-10 LAB — GLUCOSE, CAPILLARY
Glucose-Capillary: 336 mg/dL — ABNORMAL HIGH (ref 70–99)
Glucose-Capillary: 327 mg/dL — ABNORMAL HIGH (ref 70–99)
Glucose-Capillary: 286 mg/dL — ABNORMAL HIGH (ref 70–99)

## 2021-02-10 LAB — PHOSPHORUS: Phosphorus: 3 mg/dL (ref 2.5–4.6)

## 2021-02-10 LAB — MAGNESIUM: Magnesium: 1.8 mg/dL (ref 1.7–2.4)

## 2021-02-10 MED ORDER — MAGNESIUM OXIDE -MG SUPPLEMENT 400 (240 MG) MG PO TABS: 400.0000 mg | ORAL_TABLET | Freq: Every day | ORAL | Status: DC

## 2021-02-10 MED ORDER — RIVAROXABAN 20 MG PO TABS
20.0000 mg | ORAL_TABLET | Freq: Every day | ORAL | Status: DC
  Administered 2021-02-10 – 2021-02-13 (×4): 20 mg via ORAL
  Filled 2021-02-10 (×3): qty 1

## 2021-02-10 MED ORDER — DOFETILIDE 125 MCG PO CAPS
125.0000 ug | ORAL_CAPSULE | Freq: Two times a day (BID) | ORAL | Status: DC
  Administered 2021-02-10 – 2021-02-14 (×8): 125 ug via ORAL
  Filled 2021-02-10 (×9): qty 1

## 2021-02-10 MED ORDER — MAGNESIUM OXIDE -MG SUPPLEMENT 400 (240 MG) MG PO TABS
400.0000 mg | ORAL_TABLET | Freq: Two times a day (BID) | ORAL | Status: DC
  Administered 2021-02-10 – 2021-02-14 (×9): 400 mg via ORAL
  Filled 2021-02-10 (×9): qty 1

## 2021-02-10 NOTE — Progress Notes (Signed)
   02/10/21 1300  Clinical Encounter Type  Visited With Patient and family together  Visit Type Initial  Referral From Family  Consult/Referral To Chaplain  Spiritual Encounters  Spiritual Needs Literature;Brochure  Health Care Power of Attorney in chart for Mr. Samuel Thomas. Health care agent is his grandson [name].  Cell ph: [phone]

## 2021-02-10 NOTE — TOC Progression Note (Signed)
Transition of Care [facility]) - Progression Note    Patient Details  Name: DEVAUNTE GASPARINI MRN: [id number] Date of Birth: 05/30/1935  Transition of Care [facility]) CM/SW Contact  Maree Krabbe, LCSW Phone Number: 02/10/2021, 3:27 PM  Clinical Narrative:   Pt's Grandson toured [place] and has decided that he wants pt to go there. Pt unable to dc-- COVID test pending, no dc summary by 3:00-per facility request. Berkley Harvey has been obtained.     Expected Discharge Plan: Home/Self Care Barriers to Discharge: Continued Medical Work up  Expected Discharge Plan and Services Expected Discharge Plan: Home/Self Care       Living arrangements for the past 2 months: Single Family Home                                       Social Determinants of Health (SDOH) Interventions    Readmission Risk Interventions Readmission Risk Prevention Plan 02/08/2021  Transportation Screening Complete  Medication Review Oceanographer) Complete  PCP or Specialist appointment within 3-5 days of discharge Complete  HRI or Home Care Consult Complete  SW Recovery Care/Counseling Consult Complete  Palliative Care Screening Not Applicable  Skilled Nursing Facility Complete  Some recent data might be hidden

## 2021-02-10 NOTE — Progress Notes (Signed)
Physical Therapy Treatment Patient Details Name: Samuel Thomas MRN: [id number] DOB: [birth year] Today's Date: 02/10/2021   History of Present Illness Pt is an 85 y/o M admitted on 02/07/21 with c/c of a fall; pt also endorses another fall 4 days prior. Pt found to have a-fib with RVR & hypokalemic, PNA. PMH: COPD, DM2, HTN, hearing loss, pulmonary HTN    PT Comments    Pt received laying supine in bed and agreeable to PT treatment. Pt ambulated 140 ft with RW with cues to improve step length and heel strike. Pt demonstrating increased forward flexed posture while using RW and unable to correct due to chronic low back pain. Pt demonstrates poor safety awareness with device and continues to be a high fall risk during dynamic balance activities. Currently plan for SNF at discharge remains appropriate to improve independence and fall risk. Will continue to work with patient during admission to improve safety and maintain current mobility status.   Recommendations for follow up therapy are one component of a multi-disciplinary discharge planning process, led by the attending physician.  Recommendations may be updated based on patient status, additional functional criteria and insurance authorization.  Follow Up Recommendations  Skilled nursing-short term rehab (<3 hours/day)     Assistance Recommended at Discharge Frequent or constant Supervision/Assistance  Equipment Recommendations  Other (comment) (TBD next venue of care)    Recommendations for Other Services       Precautions / Restrictions Precautions Precautions: Fall Restrictions Weight Bearing Restrictions: No     Mobility  Bed Mobility Overal bed mobility: Needs Assistance Bed Mobility: Supine to Sit     Supine to sit: Modified independent (Device/Increase time);HOB elevated     General bed mobility comments: Cues for usage of bed rails to assist with trunk to sitting position    Transfers Overall transfer level: Needs  assistance Equipment used: Rolling walker (2 wheels) Transfers: Sit to/from Stand Sit to Stand: Min assist           General transfer comment: CGA for standing from slightly raised bed surface. MinA for standing off low toliet    Ambulation/Gait Ambulation/Gait assistance: Min guard Gait Distance (Feet): 140 Feet Assistive device: Rolling walker (2 wheels) Gait Pattern/deviations: Step-through pattern;Decreased step length - right;Decreased step length - left;Decreased stride length;Decreased dorsiflexion - right;Decreased dorsiflexion - left;Trunk flexed;Narrow base of support Gait velocity: decreased   General Gait Details: Trunk flexed throughout with cues to improve posture however, pt reporting chronic low back pain. Pt cued to increase heel strike bilaterally however, pt continued to place foot flat at initial contact   Stairs             Wheelchair Mobility    Modified Rankin (Stroke Patients Only)       Balance Overall balance assessment: Needs assistance Sitting-balance support: No upper extremity supported;Feet supported Sitting balance-Leahy Scale: Good     Standing balance support: During functional activity;Reliant on assistive device for balance Standing balance-Leahy Scale: Fair Standing balance comment: Requiring AD to maintain balance                            Cognition Arousal/Alertness: Awake/alert Behavior During Therapy: WFL for tasks assessed/performed Overall Cognitive Status: Within Functional Limits for tasks assessed  Exercises Other Exercises Other Exercises: Pt requesting to have a bowel movement and assisted wtih ambulating to toliet with CGA. Pt requiring minA to stand from lower surface and required totalA for pericare in standing with B UE support on RW. Pt ambulated back to recliner chair and left seated with all needs within reach.    General Comments         Pertinent Vitals/Pain Pain Assessment: No/denies pain    Home Living                          Prior Function            PT Goals (current goals can now be found in the care plan section) Acute Rehab PT Goals Patient Stated Goal: get better PT Goal Formulation: With patient Time For Goal Achievement: 02/22/21 Potential to Achieve Goals: Fair Progress towards PT goals: Progressing toward goals    Frequency    Min 2X/week      PT Plan Current plan remains appropriate    Co-evaluation              AM-PAC PT "6 Clicks" Mobility   Outcome Measure  Help needed turning from your back to your side while in a flat bed without using bedrails?: A Little Help needed moving from lying on your back to sitting on the side of a flat bed without using bedrails?: A Little Help needed moving to and from a bed to a chair (including a wheelchair)?: A Little Help needed standing up from a chair using your arms (e.g., wheelchair or bedside chair)?: A Little Help needed to walk in hospital room?: A Little Help needed climbing 3-5 steps with a railing? : Total 6 Click Score: 16    End of Session Equipment Utilized During Treatment: Gait belt Activity Tolerance: Patient tolerated treatment well;Patient limited by fatigue Patient left: in chair;with call bell/phone within reach;with chair alarm set Nurse Communication: Mobility status PT Visit Diagnosis: Muscle weakness (generalized) (M62.81);Unsteadiness on feet (R26.81);History of falling (Z91.81)     Time: 3300-7622 PT Time Calculation (min) (ACUTE ONLY): 25 min  Charges:  $Gait Training: 8-22 mins $Therapeutic Activity: 8-22 mins                     Verl Blalock, SPT    Verl Blalock 02/10/2021, 4:15 PM

## 2021-02-10 NOTE — Progress Notes (Signed)
   02/10/21 1300  Clinical Encounter Type  Visited With Patient and family together  Visit Type Initial  Referral From Family  Consult/Referral To Chaplain  Spiritual Encounters  Spiritual Needs Literature;Brochure  Chaplain Kerstie Agent completed one AD for Mr. Samuel Thomas room [phone].

## 2021-02-10 NOTE — Progress Notes (Signed)
[facility] Encounter Note  Patient: Samuel Thomas / Admit Date: 02/06/2021 / Date of Encounter: 02/10/2021, 8:34 AM   Subjective: Overall patient has had some difficulty today with atrial fibrillation with more rapid ventricular rate some more hypoxia shortness of breath cough and congestion.  No evidence of chest discomfort.  Exam suggest the patient has bibasilar Rales and/or decreased breath sounds as well suggesting infection and pneumonia and/or mild congestive heart failure.  The patient has had an echocardiogram showing normal LV systolic function with ejection fraction of 55%.  Patient has had improved glomerular filtration rate  Patient does have no current evidence of EKG changes.  Magnesium is 1.8 and will need supplementation.  Patient has a potassium level of 4.6.  Review of Systems: Positive for: Shortness of breath Negative for: Vision change, hearing change, syncope, dizziness, nausea, vomiting,diarrhea, bloody stool, stomach pain, cough, congestion, diaphoresis, urinary frequency, urinary pain,skin lesions, skin rashes Others previously listed  Objective:  Telemetry: Atrial fibrillation with rapid rate Physical Exam: Blood pressure 135/81, pulse (!) 118, temperature 98 F (36.7 C), temperature source Oral, resp. rate 20, height 5\' 7"  (1.702 m), weight 81.6 kg, SpO2 92 %. Body mass index is 28.19 kg/m. General: Well developed, well nourished, in no acute distress. Head: Normocephalic, atraumatic, sclera non-icteric, no xanthomas, nares are without discharge. Neck: No apparent masses Lungs: Normal respirations with no wheezes, diffuse rhonchi, no rales , no crackles   Heart: Irregular rate and rhythm, normal S1 S2, no murmur, no rub, no gallop, PMI is normal size and placement, carotid upstroke normal without bruit, jugular venous pressure normal Abdomen: Soft, non-tender, non-distended with normoactive bowel sounds. No hepatosplenomegaly. Abdominal  aorta is normal size without bruit Extremities: No edema, no clubbing, no cyanosis, no ulcers,  Peripheral: 2+ radial, 2+ femoral, 2+ dorsal pedal pulses Neuro: Alert and oriented. Moves all extremities spontaneously. Psych:  Responds to questions appropriately with a normal affect.   Intake/Output Summary (Last 24 hours) at 02/10/2021 0834 Last data filed at 02/10/2021 0801 Gross per 24 hour  Intake 2532.64 ml  Output 1250 ml  Net 1282.64 ml     Inpatient Medications:  . vitamin C  500 mg Oral Daily  . aspirin EC  81 mg Oral Daily  . atorvastatin  20 mg Oral QHS  . budesonide  0.5 mg Nebulization Daily  . diltiazem  240 mg Oral Daily  . dofetilide  125 mcg Oral Q24H  . folic acid  1 mg Oral Daily  . gabapentin  200 mg Oral TID  . guaiFENesin  600 mg Oral BID  . insulin aspart  0-9 Units Subcutaneous TID WC  . iron polysaccharides  150 mg Oral Daily  . multivitamin with minerals  1 tablet Oral Daily  . Ensure Max Protein  11 oz Oral BID  . rivaroxaban  15 mg Oral Q supper  . sodium chloride flush  3 mL Intravenous Q12H  . thiamine  100 mg Oral Daily  . tiotropium  18 mcg Inhalation Daily   Infusions:  . azithromycin Stopped (02/09/21 1803)  . cefTRIAXone (ROCEPHIN)  IV Stopped (02/09/21 1742)  . lactated ringers 75 mL/hr at 02/10/21 0819    Labs: Recent Labs    02/09/21 0456 02/10/21 0600  NA 138 136  K 3.8 4.6  CL 103 103  CO2 26 25  GLUCOSE 288* 302*  BUN 40* 28*  CREATININE 1.14 1.00  CALCIUM 8.5* 8.5*  MG 1.9 1.8  PHOS 2.9 3.0  No results for input(s): AST, ALT, ALKPHOS, BILITOT, PROT, ALBUMIN in the last 72 hours. Recent Labs    02/09/21 0456 02/10/21 0600  WBC 8.5 8.6  HGB 12.8* 12.6*  HCT 38.7* 38.3*  MCV 92.4 92.7  PLT 197 221    No results for input(s): CKTOTAL, CKMB, TROPONINI in the last 72 hours.  Invalid input(s): POCBNP No results for input(s): HGBA1C in the last 72 hours.    Weights: Filed Weights   02/06/21 1727  Weight:  81.6 kg     Radiology/Studies:  CT Head Wo Contrast  Result Date: 02/06/2021 CLINICAL DATA:  Fall with weakness EXAM: CT HEAD WITHOUT CONTRAST TECHNIQUE: Contiguous axial images were obtained from the base of the skull through the vertex without intravenous contrast. COMPARISON:  CT brain 12/01/2013 FINDINGS: Brain: No acute territorial infarction, hemorrhage, or intracranial mass. Moderate atrophy. Moderate chronic small vessel ischemic changes of the white matter. Mildly prominent ventricles felt secondary to atrophy. Vascular: No hyperdense vessels. Vertebral and carotid vascular calcification Skull: Normal. Negative for fracture or focal lesion. Sinuses/Orbits: No acute finding. Other: None IMPRESSION: 1. No CT evidence for acute intracranial abnormality. 2. Atrophy and chronic small vessel ischemic changes of the white matter Electronically Signed   By: Jasmine Pang M.D.   On: 02/06/2021 18:26   DG Chest Portable 1 View  Result Date: 02/06/2021 CLINICAL DATA:  Weakness EXAM: PORTABLE CHEST 1 VIEW COMPARISON:  12/02/2013 FINDINGS: Consolidation in the right lower lobe compatible with pneumonia. Heart is normal size. Left lung clear. No effusions or acute bony abnormality. IMPRESSION: Right lower lobe pneumonia. Followup PA and lateral chest X-ray is recommended in 3-4 weeks following trial of antibiotic therapy to ensure resolution and exclude underlying malignancy. Electronically Signed   By: Charlett Nose M.D.   On: 02/06/2021 17:53   ECHOCARDIOGRAM COMPLETE  Result Date: 02/08/2021    ECHOCARDIOGRAM REPORT   Patient Name:   Samuel Thomas Date of Exam: 02/07/2021 Medical Rec #:  [id number]    Height:       67.0 in Accession #:    [id number]   Weight:       180.0 lb Date of Birth:  [birth year]    BSA:          1.934 m Patient Age:    85 years     BP:           104/69 mmHg Patient Gender: M            HR:           63 bpm. Exam Location:  [facility] Procedure: 2D Echo Indications:     Atrial  Fibrillation I48.91  History:         Patient has no prior history of Echocardiogram examinations.                  Risk Factors:Hypertension, Diabetes and Dyslipidemia.  Sonographer:     Johnathan Hausen Referring Phys:  [id number] IPJA S PATEL Diagnosing Phys: Arnoldo Hooker MD  Sonographer Comments: Technically difficult study due to poor echo windows, suboptimal parasternal window and no apical window. Image acquisition challenging due to patient body habitus. IMPRESSIONS  1. Left ventricular ejection fraction, by estimation, is 55 to 60%. The left ventricle has normal function. The left ventricle has no regional wall motion abnormalities. Left ventricular diastolic function could not be evaluated.  2. Right ventricular systolic function is normal. The right ventricular size is normal.  3. Left atrial size was  mildly dilated.  4. The mitral valve is normal in structure. Mild mitral valve regurgitation.  5. The aortic valve is grossly normal. Aortic valve regurgitation is not visualized. FINDINGS  Left Ventricle: Left ventricular ejection fraction, by estimation, is 55 to 60%. The left ventricle has normal function. The left ventricle has no regional wall motion abnormalities. The left ventricular internal cavity size was normal in size. There is  no left ventricular hypertrophy. Left ventricular diastolic function could not be evaluated. Right Ventricle: The right ventricular size is normal. No increase in right ventricular wall thickness. Right ventricular systolic function is normal. Left Atrium: Left atrial size was mildly dilated. Right Atrium: Right atrial size was normal in size. Pericardium: There is no evidence of pericardial effusion. Mitral Valve: The mitral valve is normal in structure. Mild mitral valve regurgitation. Tricuspid Valve: The tricuspid valve is normal in structure. Tricuspid valve regurgitation is trivial. Aortic Valve: The aortic valve is grossly normal. Aortic valve regurgitation is not  visualized. Pulmonic Valve: The pulmonic valve was not assessed. Pulmonic valve regurgitation is not visualized. Aorta: The aortic root and ascending aorta are structurally normal, with no evidence of dilitation. IAS/Shunts: The interatrial septum was not assessed.  LEFT VENTRICLE PLAX 2D LVIDd:         4.70 cm LVIDs:         3.30 cm LV PW:         1.20 cm LV IVS:        0.70 cm  LEFT ATRIUM         Index LA diam:    3.70 cm 1.91 cm/m  TRICUSPID VALVE TR Peak grad:   30.5 mmHg TR Vmax:        276.00 cm/s Arnoldo Hooker MD Electronically signed by Arnoldo Hooker MD Signature Date/Time: 02/08/2021/7:10:44 AM    Final    US THYROID  Result Date: 02/07/2021 CLINICAL DATA:  Elevated serum free T4 level EXAM: THYROID ULTRASOUND TECHNIQUE: Ultrasound examination of the thyroid gland and adjacent soft tissues was performed. COMPARISON:  None. FINDINGS: Parenchymal Echotexture: Moderately heterogenous Isthmus: 0.2 cm thickness Right lobe: 4.2 x 1.8 x 1.8 cm Left lobe: 3.5 x 1.5 x 1.2 cm _________________________________________________________ Estimated total number of nodules >/= 1 cm: 1 Number of spongiform nodules >/=  2 cm not described below (TR1): 0 Number of mixed cystic and solid nodules >/= 1.5 cm not described below (TR2): 0 _________________________________________________________ Nodule 1: 0.9 cm benign septated cyst, mid right Nodule # 2: Location: Right; inferior Maximum size: 1.1 cm; Other 2 dimensions: 1.1 x 0.7 cm Composition: solid/almost completely solid (2) Echogenicity: hypoechoic (2) Shape: not taller-than-wide (0) Margins: smooth (0) Echogenic foci: macrocalcifications (1) ACR TI-RADS total points: 5. ACR TI-RADS risk category: TR4 (4-6 points). ACR TI-RADS recommendations: *Given size (>/= 1 - 1.4 cm) and appearance, a follow-up ultrasound in 1 year should be considered based on TI-RADS criteria. _________________________________________________________ Nodule 3: 0.7 cm complex cyst, mid left;  This nodule does NOT meet TI-RADS criteria for biopsy or dedicated follow-up. No regional cervical adenopathy identified. IMPRESSION: 1. Normal-sized thyroid with bilateral nodules as above. None meets criteria for biopsy. 2. Recommend annual/biennial ultrasound follow-up of inferior right nodule as above, until stability x5 years confirmed. The above is in keeping with the ACR TI-RADS recommendations - J Am Coll Radiol 2017;14:587-595. Electronically Signed   By: Corlis Leak M.D.   On: 02/07/2021 12:33     Assessment and Recommendation  85 y.o. male with acute paroxysmal nonvalvular  atrial fibrillation with rapid ventricular rate due to right lower lobe pneumonia with chronic kidney disease cough and congestion and no current evidence of   acute coronary syndrome although may have some basilar rails causing or contributing to patient's current worsening hypoxia 1.  Continuation of Tikosyn for possible spontaneous conversion to normal rhythm and hopeful maintenance of normal rhythm thereafter but will need continuation of close analysis of magnesium above 2, potassium above 4, and glomerular filtration rate above 30. 2.  Continuation of diltiazem 240 mg CD for heart rate control of atrial fibrillation and possible spontaneous conversion to normal rhythm 3.  No further cardiac diagnostics necessary at this time 4.  Continue supportive care and treatment of pneumonia likely exacerbating above and would consider the possibility of chest x-ray to reassess the possibility of pulmonary edema.  At that time patient may benefit from 1 dose of Lasix 5.  Begin ambulation and follow improvements of symptoms  Signed, Arnoldo Hooker M.D. FACC

## 2021-02-10 NOTE — Progress Notes (Signed)
[facility] Progress Note  Patient: Samuel Thomas    MLY:[id number]  DOA: 02/06/2021     Date of Service: the patient was seen and examined on 02/10/2021  No chief complaint on file.  Brief hospital course: Samuel Thomas is a 85 y.o. male with PMH of COPD. DMT2, HTN, Hearing loss, pulmonary HTN,  seen in ed with complaints of fall. PT had a fall today between 3-5 pm, and then his neighbor called his nephew and he called ems. Pt is alert,awake and oriented but is his baseline.  Pt states before hsi fall he was dizzy, and weak and his legs felt like water. It has happened before four days ago when he fell and was dizzy. Pt denies any eyes ears, nose or speech or gait issues. Pt states he is weak in his legs. At home has not been abel to do ADL. Daughter at bedside pt has been declining for past few weeks.  Pt has h/o c/h a/fib for 20 year, and takes [place]. Pt also takes blood thinner but does not know name, then pt states he take eliquis and xarelto both. Once med rec is done we can cont with the correct medication.      Assessment and Plan: Principal Problem:   Fall Active Problems:   Tobacco abuse   Generalized weakness   Atrial fibrillation with RVR (HCC)   Essential hypertension   COPD (chronic obstructive pulmonary disease) (HCC)   Diabetes mellitus (HCC)   A.FIB rvr: S/p Cardizem IV infusion and transitioned to Cardizem CD 240 mg daily as per cardiology Cont asa and tiokosdyn. Resumed Xarelto, Eliquis will be discontinued on discharge.   Diastolic heart failure: 2 d echo LVEF 55 to 60%, no regional wall motion abnormality, Cont  tikosyn, asa, and lasix to be ordered Prn while monitoring weight , electrolyte  and kidney function.  Cardiology consult appreciated  Hypokalemic, potassium repleted Hypophosphatemia, phos repleted.   Fall/ dizziness: We will admit to telemetry unit ad obtain orthostatic vitals.  We will cut back his bp meds and diabetes meds on hold so  we can do SSI/ Glycemic protocol. Fall precaution// aspiration precaution.   orthostatic BP negative   Community-acquired pneumonia, Leukocytosis, chest x-ray consistent with right lower lobe pneumonia Patient received ceftriaxone azithromycin in the ED which has been continued Continue Mucinex twice daily, Robitussin-DM as needed 10/31 CXR Worsening right lower lobe consolidation with new left mid lung consolidation concerning for multifocal pneumonia. Clinically patient seems to be stable, saturating well on room air, not in respiratory distress.   Abnormal thyroid functions, TSH at lower end, slightly elevated FT4 level Follow free T3 Thyroid US 1. Normal-sized thyroid with bilateral nodules as above. None meets criteria for biopsy. 2. Recommend annual/biennial ultrasound follow-up of inferior right nodule as above, until stability x5 years confirmed. Patient may need to be treated for hypothyroid, will recheck free T4 level tomorrow a.m.   AKI on CKD stage III Creatinine  1.9--1.5--1.14 gradually improving Continue IV fluid for hydration   Tobacco abuse: Nicotine patch.    Generalized weakness: PT consult.      HTN: S/p Cardizem IV infusion, started Cardizem CD  We will continue monitor BP and titrate medications accordingly   COPD: Cont nebulizer and inhalers.    DM II; HbA1c 7.1, well controlled Held home medications for now Continue moderate sliding scale, monitor FSBG Continue diabetic diet  Iron deficiency, iron saturation 6%, started oral iron supplement.  Follow with PCP to repeat  iron profile after 3 to 6 months.  [id number] and folic acid within normal range   PT/OT eval done, recommend SNF placement  Body mass index is 28.19 kg/m.  Nutrition Problem: Increased nutrient needs Etiology: chronic illness (COPD) Interventions:       Diet: Carb modified diet DVT Prophylaxis: Therapeutic Anticoagulation with Xarelto    Advance goals of care discussion:  DNR  Family Communication: family was not present at bedside, at the time of interview.  The pt provided permission to discuss medical plan with the family. Opportunity was given to ask question and all questions were answered satisfactorily.   Disposition:  Pt is from Home, admitted with fall, PNA and A. fib with RVR, still has generalized weakness, at risk for fall, which precludes a safe discharge. Discharge to SNF, clinically stable, medically optimized and can be discharged to SNF when bed is available.  Follow cardiology clearance disposition plan.    Subjective: No significant events overnight, patient was resting comfortably, he was sleeping and woke up by calling his name.  Patient seen tired, without any acute respiratory distress.  Stated that he is feeling fine, denied any worsening of shortness of breath or cough, no chest pain or palpitations.  Physical Exam: General:  alert oriented to time, place, and person.  Appear in no distress, affect appropriate Eyes: PERRLA ENT: Oral Mucosa Clear, moist  Neck: no JVD,  Cardiovascular: S1 and S2 Present, no Murmur,  Respiratory: good respiratory effort, Bilateral Air entry equal and Decreased, mild crackles, no significant wheezes Abdomen: Bowel Sound present, Soft and no tenderness,  Skin: no rashes Extremities: no Pedal edema, no calf tenderness Neurologic: without any new focal findings Gait not checked due to patient safety concerns  Vitals:   02/10/21 0159 02/10/21 0756 02/10/21 1051 02/10/21 1144  BP: 134/84 135/81  136/84  Pulse: 96 (!) 118  89  Resp: (!) 33 20    Temp: 98.5 F (36.9 C) 98 F (36.7 C)  97.6 F (36.4 C)  TempSrc: Oral Oral  Axillary  SpO2: 97% 92% 96% 94%  Weight:      Height:        Intake/Output Summary (Last 24 hours) at 02/10/2021 1612 Last data filed at 02/10/2021 1501 Gross per 24 hour  Intake 1977.77 ml  Output 1250 ml  Net 727.77 ml   Filed Weights   02/06/21 1727  Weight: 81.6 kg     Data Reviewed: I have personally reviewed and interpreted daily labs, tele strips, imagings as discussed above. I reviewed all nursing notes, pharmacy notes, vitals, pertinent old records I have discussed plan of care as described above with RN and patient/family.  CBC: Recent Labs  Lab 02/06/21 1726 02/07/21 0535 02/08/21 0437 02/09/21 0456 02/10/21 0600  WBC 12.4* 14.1* 10.3 8.5 8.6  HGB 13.7 12.9* 13.0 12.8* 12.6*  HCT 39.4 39.2 38.9* 38.7* 38.3*  MCV 93.4 91.6 94.9 92.4 92.7  PLT 159 180 171 197 [id number]   Basic Metabolic Panel: Recent Labs  Lab 02/06/21 1726 02/07/21 0535 02/07/21 1553 02/08/21 0437 02/09/21 0456 02/10/21 0600  NA 139 137  --  138 138 136  K 3.5 3.0*  --  4.7 3.8 4.6  CL 100 98  --  106 103 103  CO2 27 25  --  28 26 25   GLUCOSE 260* 237*  --  265* 288* 302*  BUN 70* 58*  --  49* 40* 28*  CREATININE 1.90* 1.54*  --  1.45* 1.14 1.00  CALCIUM 8.3* 8.2*  --  8.3* 8.5* 8.5*  MG  --  2.0  --  2.0 1.9 1.8  PHOS  --   --  3.1 2.3* 2.9 3.0    Studies: DG Chest 1 View  Result Date: 02/10/2021 CLINICAL DATA:  Pneumonia, history of CHF EXAM: CHEST  1 VIEW COMPARISON:  October 27 FINDINGS: The heart measures at the upper limit of normal. There is increased right lower lobe consolidation. A small right pleural effusion cannot be excluded. No left pleural effusion. No pneumothorax. No addition to the increased consolidation the right lung, there is now new consolidation the mid lung. No acute osseous abnormality. IMPRESSION: Worsening right lower lobe consolidation with new left mid lung consolidation concerning for multifocal pneumonia. Electronically Signed   By: Albin Felling M.D.   On: 02/10/2021 10:10    Scheduled Meds:  vitamin C  500 mg Oral Daily   aspirin EC  81 mg Oral Daily   atorvastatin  20 mg Oral QHS   budesonide  0.5 mg Nebulization Daily   diltiazem  240 mg Oral Daily   dofetilide  125 mcg Oral BID   folic acid  1 mg Oral Daily    gabapentin  200 mg Oral TID   guaiFENesin  600 mg Oral BID   insulin aspart  0-9 Units Subcutaneous TID WC   iron polysaccharides  150 mg Oral Daily   magnesium oxide  400 mg Oral BID   multivitamin with minerals  1 tablet Oral Daily   Ensure Max Protein  11 oz Oral BID   rivaroxaban  20 mg Oral Q supper   sodium chloride flush  3 mL Intravenous Q12H   thiamine  100 mg Oral Daily   tiotropium  18 mcg Inhalation Daily   Continuous Infusions:  azithromycin Stopped (02/09/21 1803)   cefTRIAXone (ROCEPHIN)  IV Stopped (02/09/21 1742)   lactated ringers 75 mL/hr at 02/10/21 1506   PRN Meds: acetaminophen **OR** acetaminophen, albuterol, bisacodyl, guaiFENesin-dextromethorphan, hydrALAZINE, ipratropium-albuterol, ondansetron **OR** ondansetron (ZOFRAN) IV, polyethylene glycol  Time spent: 35 minutes  Author: Val Riles. MD [facility] 02/10/2021 4:12 PM  To reach On-call, see care teams to locate the attending and reach out to them via [email or web address]. If 7PM-7AM, please contact night-coverage If you still have difficulty reaching the attending provider, please page the [facility] (Director on Call) for [facility] on amion for assistance.

## 2021-02-10 NOTE — Progress Notes (Signed)
Inpatient Diabetes Program Recommendations  AACE/ADA: New Consensus Statement on Inpatient Glycemic Control (2015)  Target Ranges:  Prepandial:   less than 140 mg/dL      Peak postprandial:   less than 180 mg/dL (1-2 hours)      Critically ill patients:  140 - 180 mg/dL  Results for [name], [name] (MRN [id number]) as of 02/10/2021 07:18  Ref. Range 02/09/2021 08:07 02/09/2021 11:07 02/09/2021 16:40  Glucose-Capillary Latest Ref Range: 70 - 99 mg/dL 432 (H)  5 units Novolog  338 (H)  7 units Novolog  280 (H)  5 units Novolog   Results for [name], [name] (MRN [id number]) as of 02/10/2021 10:45  Ref. Range 02/10/2021 07:54  Glucose-Capillary Latest Ref Range: 70 - 99 mg/dL 574 (H)  7 units Novolog      Home DM Meds: Humalog 6 units TID with meals       Metformin 1000 mg BID  Current Orders: Novolog Sensitive Correction Scale/ SSI (0-9 units) TID AC    MD- Note CBGs >250.  Takes Humalog with meals at home.  Please consider:  1. Start Semglee 8 units Daily (0.1 units/kg)  2. Start Novolog Meal Coverage: Novolog 6 units TID with meals (home dose) Hold if pt eats <50% of meal, Hold if pt NPO    --Will follow patient during hospitalization--  Ambrose Finland RN, MSN, CDE Diabetes Coordinator Inpatient Glycemic Control Team Team Pager: [phone] (8a-5p)

## 2021-02-10 NOTE — Care Management Important Message (Signed)
Important Message  Patient Details  Name: Samuel Thomas MRN: [id number] Date of Birth: 1935-06-07   Medicare Important Message Given:  Yes     [name] 02/10/2021, 3:25 PM

## 2021-02-10 NOTE — TOC Progression Note (Addendum)
Transition of Care [facility]) - Progression Note    Patient Details  Name: Samuel Thomas MRN: [id number] Date of Birth: September 19, 1935  [facility]) CM/SW Contact  Maree Krabbe, LCSW Phone Number: 02/10/2021, 10:14 AM  Clinical Narrative:   CSW received a call from [place] stating he is the only one in the family who is able to assist pt. Lucila Maine states he is going to start searching for long term care for pt in terms of ALF. Pt's Lucila Maine is aware he will need to get pt applied for Medicaid. Per Lucila Maine pt is going to make him HCPOA--CSW to confirm with pt. Grandson would prefer pt go to [facility] for SNF as he has a friend who is a therpist there--CSW will confirm with Pt.    Expected Discharge Plan: Home/Self Care Barriers to Discharge: Continued Medical Work up  Expected Discharge Plan and Services Expected Discharge Plan: Home/Self Care       Living arrangements for the past 2 months: Single Family Home                                       Social Determinants of Health (SDOH) Interventions    Readmission Risk Interventions Readmission Risk Prevention Plan 02/08/2021  Transportation Screening Complete  Medication Review Oceanographer) Complete  PCP or Specialist appointment within 3-5 days of discharge Complete  HRI or Home Care Consult Complete  SW Recovery Care/Counseling Consult Complete  Palliative Care Screening Not Applicable  Skilled Nursing Facility Complete  Some recent data might be hidden

## 2021-02-10 NOTE — Progress Notes (Signed)
   02/10/21 0012  Assess: MEWS Score  Temp 99 F (37.2 C)  BP (!) 143/78  Pulse Rate (!) 109  Resp (!) 32  SpO2 97 %  O2 Device Nasal Cannula  O2 Flow Rate (L/min) 2 L/min  Assess: MEWS Score  MEWS Temp 0  MEWS Systolic 0  MEWS Pulse 1  MEWS RR 2  MEWS LOC 0  MEWS Score 3  MEWS Score Color Yellow  Assess: if the MEWS score is Yellow or Red  Were vital signs taken at a resting state? Yes  Focused Assessment No change from prior assessment  Does the patient meet 2 or more of the SIRS criteria? No  MEWS guidelines implemented *See Row Information* Yes  Treat  MEWS Interventions Administered scheduled meds/treatments  Pain Scale 0-10  Pain Score 0  Patients response to intervention Effective  Take Vital Signs  Increase Vital Sign Frequency  Yellow: Q 2hr X 2 then Q 4hr X 2, if remains yellow, continue Q 4hrs  Escalate  MEWS: Escalate Yellow: discuss with charge nurse/RN and consider discussing with provider and RRT  Notify: Charge Nurse/RN  Name of Charge Nurse/RN Notified Lillia Abed RN  Date Charge Nurse/RN Notified 02/10/21  Time Charge Nurse/RN Notified 0015  Document  Patient Outcome Stabilized after interventions  Assess: SIRS CRITERIA  SIRS Temperature  0  SIRS Pulse 1  SIRS Respirations  1  SIRS WBC 0  SIRS Score Sum  2

## 2021-02-10 NOTE — TOC Progression Note (Signed)
Transition of Care [facility]) - Progression Note    Patient Details  Name: Samuel Thomas MRN: [id number] Date of Birth: [birth year]  Transition of Care [facility]) CM/SW Contact  Maree Krabbe, LCSW Phone Number: 02/10/2021, 12:03 PM  Clinical Narrative:   CSW spoke with pt and pt states he is agreeable to CSW speaking with Grandson to determine plan. Pt is also agreeable to [facility]. Pt was asking desperately for someone to help him brush his teeth stating he hasn't brushed his teeth since prior to being admitted. CSW will reach out to staff to see if anyone can help him do this.    Expected Discharge Plan: Home/Self Care Barriers to Discharge: Continued Medical Work up  Expected Discharge Plan and Services Expected Discharge Plan: Home/Self Care       Living arrangements for the past 2 months: Single Family Home                                       Social Determinants of Health (SDOH) Interventions    Readmission Risk Interventions Readmission Risk Prevention Plan 02/08/2021  Transportation Screening Complete  Medication Review Oceanographer) Complete  PCP or Specialist appointment within 3-5 days of discharge Complete  HRI or Home Care Consult Complete  SW Recovery Care/Counseling Consult Complete  Palliative Care Screening Not Applicable  Skilled Nursing Facility Complete  Some recent data might be hidden

## 2021-02-11 LAB — GLUCOSE, CAPILLARY
Glucose-Capillary: 252 mg/dL — ABNORMAL HIGH (ref 70–99)
Glucose-Capillary: 307 mg/dL — ABNORMAL HIGH (ref 70–99)
Glucose-Capillary: 241 mg/dL — ABNORMAL HIGH (ref 70–99)
Glucose-Capillary: 297 mg/dL — ABNORMAL HIGH (ref 70–99)

## 2021-02-11 LAB — CBC
HCT: 38.6 % — ABNORMAL LOW (ref 39.0–52.0)
Hemoglobin: 12.1 g/dL — ABNORMAL LOW (ref 13.0–17.0)
MCH: 30 pg (ref 26.0–34.0)
MCHC: 31.3 g/dL (ref 30.0–36.0)
MCV: 95.8 fL (ref 80.0–100.0)
Platelets: 200 10*3/uL (ref 150–400)
RBC: 4.03 MIL/uL — ABNORMAL LOW (ref 4.22–5.81)
RDW: 13.4 % (ref 11.5–15.5)
WBC: 9.7 10*3/uL (ref 4.0–10.5)
nRBC: 0 % (ref 0.0–0.2)

## 2021-02-11 LAB — CULTURE, BLOOD (ROUTINE X 2)
Culture: NO GROWTH
Culture: NO GROWTH

## 2021-02-11 LAB — BASIC METABOLIC PANEL
Anion gap: 5 (ref 5–15)
BUN: 21 mg/dL (ref 8–23)
CO2: 26 mmol/L (ref 22–32)
Calcium: 8.6 mg/dL — ABNORMAL LOW (ref 8.9–10.3)
Chloride: 106 mmol/L (ref 98–111)
Creatinine, Ser: 0.73 mg/dL (ref 0.61–1.24)
GFR, Estimated: >60 mL/min (ref >60)
Glucose, Bld: 281 mg/dL — ABNORMAL HIGH (ref 70–99)
Potassium: 4.3 mmol/L (ref 3.5–5.1)
Sodium: 137 mmol/L (ref 135–145)

## 2021-02-11 LAB — PROCALCITONIN: Procalcitonin: 0.76 ng/mL

## 2021-02-11 LAB — SARS CORONAVIRUS 2 (TAT 6-24 HRS): SARS Coronavirus 2: NEGATIVE

## 2021-02-11 MED ORDER — INSULIN GLARGINE-YFGN 100 UNIT/ML ~~LOC~~ SOLN
10.0000 [IU] | Freq: Every day | SUBCUTANEOUS | Status: DC
  Administered 2021-02-11: 10 [IU] via SUBCUTANEOUS
  Filled 2021-02-11 (×2): qty 0.1

## 2021-02-11 MED ORDER — LEVOFLOXACIN 500 MG PO TABS
750.0000 mg | ORAL_TABLET | Freq: Every day | ORAL | Status: AC
  Administered 2021-02-11 – 2021-02-13 (×3): 750 mg via ORAL
  Filled 2021-02-11 (×3): qty 2

## 2021-02-11 NOTE — Progress Notes (Signed)
Physical Therapy Treatment Patient Details Name: Samuel Thomas MRN: [id number] DOB: 1935/12/31 Today's Date: 02/11/2021   History of Present Illness Pt is an 85 y/o M admitted on 02/07/21 with c/c of a fall; pt also endorses another fall 4 days prior. Pt found to have a-fib with RVR & hypokalemic, PNA. PMH: COPD, DM2, HTN, hearing loss, pulmonary HTN    PT Comments    Pt received laying supine in bed and agreeable to PT treatment. Pt completed bed mobility mod I with additional time and cues to scoot towards EOB. Pt stood with CGA with RW for safety from lower bed surface. Pt able to ambulate in room with RW + close SBA and continues to demonstrate decreased safety awareness with usage of RW and increased forward flexed posture. Pt negotiated 6 steps with B UE assistance + 2 for safety and demonstrates impaired eccentric control while descending stairs placing him at a high fall risk. Pt HR reached 120 bpm during stair training and after seated rest break returned back to 98 bpm. This author had long discussion with Grandson regarding options of care moving forward and explaining differences between STR, LTC, and independently living. Pt current barriers to return home include inaccessible apartment (2 flights of stairs to enter), limited activity tolerance, and decreased caregiver support throughout the day. Recommending SNF at this time to improve overall mobility and independence. Will continue to work with patient during acute admission to maintain muscular strength and reduce fall risk.   Recommendations for follow up therapy are one component of a multi-disciplinary discharge planning process, led by the attending physician.  Recommendations may be updated based on patient status, additional functional criteria and insurance authorization.  Follow Up Recommendations  Skilled nursing-short term rehab (<3 hours/day)     Assistance Recommended at Discharge Frequent or constant  Supervision/Assistance  Equipment Recommendations  Other (comment) (TBD next venue of care)    Recommendations for Other Services       Precautions / Restrictions Precautions Precautions: Fall Restrictions Weight Bearing Restrictions: No     Mobility  Bed Mobility Overal bed mobility: Modified Independent Bed Mobility: Supine to Sit     Supine to sit: Modified independent (Device/Increase time)     General bed mobility comments: NT, up in recliner    Transfers Overall transfer level: Needs assistance Equipment used: Rolling walker (2 wheels) Transfers: Sit to/from Stand Sit to Stand: Min guard           General transfer comment: CGA for safety with standing from lower surface    Ambulation/Gait Ambulation/Gait assistance: Min guard Gait Distance (Feet): 50 Feet Assistive device: Rolling walker (2 wheels) Gait Pattern/deviations: Step-through pattern;Decreased stride length;Decreased dorsiflexion - left;Decreased dorsiflexion - right Gait velocity: decreased   General Gait Details: Pt continues to demonstrate increased forward flexed posture during ambulation and decreased safety with usage of RW   Stairs Stairs: Yes Stairs assistance: Min assist;+2 safety/equipment Stair Management: Two rails;Step to pattern;Forwards Number of Stairs: 6 General stair comments: Requiring +2 for safety with descending stairs. Pt with R knee buckling when attempting to step down with R foot. Improved with stepping down with L LE. Decreased eccentric control with descending steps with B UE support (1 rail, 1 Handheld assistance)   Wheelchair Mobility    Modified Rankin (Stroke Patients Only)       Balance Overall balance assessment: Needs assistance Sitting-balance support: No upper extremity supported;Feet supported Sitting balance-Leahy Scale: Good     Standing balance support: Rel[place]  on assistive device for balance;During functional activity Standing  balance-Leahy Scale: Fair Standing balance comment: Good static, Fair dynamic standing balance requiring the AD for balance                            Cognition Arousal/Alertness: Awake/alert Behavior During Therapy: WFL for tasks assessed/performed Overall Cognitive Status: Within Functional Limits for tasks assessed                                 General Comments: pt required cues to redirect to task        Exercises      General Comments        Pertinent Vitals/Pain Pain Assessment: No/denies pain    Home Living                          Prior Function            PT Goals (current goals can now be found in the care plan section) Acute Rehab PT Goals Patient Stated Goal: get better and find a better living situation PT Goal Formulation: With patient Time For Goal Achievement: 02/22/21 Potential to Achieve Goals: Fair Progress towards PT goals: Progressing toward goals    Frequency    Min 2X/week      PT Plan Current plan remains appropriate    Co-evaluation              AM-PAC PT "6 Clicks" Mobility   Outcome Measure  Help needed turning from your back to your side while in a flat bed without using bedrails?: None Help needed moving from lying on your back to sitting on the side of a flat bed without using bedrails?: None Help needed moving to and from a bed to a chair (including a wheelchair)?: A Little Help needed standing up from a chair using your arms (e.g., wheelchair or bedside chair)?: A Little Help needed to walk in hospital room?: A Little Help needed climbing 3-5 steps with a railing? : Total 6 Click Score: 18    End of Session Equipment Utilized During Treatment: Gait belt Activity Tolerance: Patient tolerated treatment well;Patient limited by fatigue Patient left: in chair;with call bell/phone within reach;with chair alarm set;with family/visitor present Nurse Communication: Mobility status PT  Visit Diagnosis: Muscle weakness (generalized) (M62.81);Unsteadiness on feet (R26.81);History of falling (Z91.81)     Time: 7034-0352 PT Time Calculation (min) (ACUTE ONLY): 38 min  Charges:  $Gait Training: 38-52 mins                     Verl Blalock, SPT   Verl Blalock 02/11/2021, 4:28 PM

## 2021-02-11 NOTE — Progress Notes (Signed)
[facility] Encounter Note  Patient: Samuel Thomas / Admit Date: 02/06/2021 / Date of Encounter: 02/11/2021, 1:21 PM   Subjective: Overall patient has had some difficulty with atrial fibrillation with more rapid ventricular rate some more hypoxia shortness of breath cough and congestion.  No evidence of chest discomfort.  Exam suggest the patient has bibasilar Rales and/or decreased breath sounds as well suggesting infection and pneumonia and/or mild congestive heart failure.  The patient has had an echocardiogram showing normal LV systolic function with ejection fraction of 55%.  Patient has had improved glomerular filtration rate  Patient does have no current evidence of EKG changes.  Magnesium is 1.8 and will need supplementation.  Patient has a potassium level of 4.3.  Today patient continues to endorse shortness of breath with exertion but denies any chest pain, palpitations. Patient heart rate control has been better the last 24 hours with a heart rate averaging in the 90s.  Review of Systems: Positive for: Shortness of breath Negative for: Vision change, hearing change, syncope, dizziness, nausea, vomiting,diarrhea, bloody stool, stomach pain, cough, congestion, diaphoresis, urinary frequency, urinary pain,skin lesions, skin rashes Others previously listed  Objective:  Telemetry: Atrial fibrillation with rapid rate Physical Exam: Blood pressure (!) 155/93, pulse 98, temperature 98 F (36.7 C), resp. rate 20, height 5\' 7"  (1.702 m), weight 81.6 kg, SpO2 93 %. Body mass index is 28.19 kg/m. General: Well developed, well nourished, in no acute distress. Head: Normocephalic, atraumatic, sclera non-icteric, no xanthomas, nares are without discharge. Neck: No apparent masses Lungs: Normal respirations with no wheezes, diffuse rhonchi, no rales , no crackles   Heart: Irregular rate and rhythm, normal S1 S2, no murmur, no rub, no gallop, PMI is normal size and  placement, carotid upstroke normal without bruit, jugular venous pressure normal Abdomen: Soft, non-tender, non-distended with normoactive bowel sounds. No hepatosplenomegaly. Abdominal aorta is normal size without bruit Extremities: No edema, no clubbing, no cyanosis, no ulcers,  Peripheral: 2+ radial, 2+ femoral, 2+ dorsal pedal pulses Neuro: Alert and oriented. Moves all extremities spontaneously. Psych:  Responds to questions appropriately with a normal affect.   Intake/Output Summary (Last 24 hours) at 02/11/2021 1321 Last data filed at 02/11/2021 1100 Gross per 24 hour  Intake 2502.81 ml  Output 1100 ml  Net 1402.81 ml    Inpatient Medications:  . vitamin C  500 mg Oral Daily  . aspirin EC  81 mg Oral Daily  . atorvastatin  20 mg Oral QHS  . budesonide  0.5 mg Nebulization Daily  . diltiazem  240 mg Oral Daily  . dofetilide  125 mcg Oral BID  . folic acid  1 mg Oral Daily  . gabapentin  200 mg Oral TID  . guaiFENesin  600 mg Oral BID  . insulin aspart  0-9 Units Subcutaneous TID WC  . insulin glargine-yfgn  10 Units Subcutaneous QHS  . iron polysaccharides  150 mg Oral Daily  . levofloxacin  750 mg Oral Daily  . magnesium oxide  400 mg Oral BID  . multivitamin with minerals  1 tablet Oral Daily  . Ensure Max Protein  11 oz Oral BID  . rivaroxaban  20 mg Oral Q supper  . sodium chloride flush  3 mL Intravenous Q12H  . thiamine  100 mg Oral Daily  . tiotropium  18 mcg Inhalation Daily   Infusions:  . cefTRIAXone (ROCEPHIN)  IV 1 g (02/10/21 1641)  . lactated ringers 75 mL/hr at 02/11/21 0909  Labs: Recent Labs    02/09/21 0456 02/10/21 0600 02/11/21 0516  NA 138 136 137  K 3.8 4.6 4.3  CL 103 103 106  CO2 26 25 26   GLUCOSE 288* 302* 281*  BUN 40* 28* 21  CREATININE 1.14 1.00 0.73  CALCIUM 8.5* 8.5* 8.6*  MG 1.9 1.8  --   PHOS 2.9 3.0  --    No results for input(s): AST, ALT, ALKPHOS, BILITOT, PROT, ALBUMIN in the last 72 hours. Recent Labs     02/10/21 0600 02/11/21 0516  WBC 8.6 9.7  HGB 12.6* 12.1*  HCT 38.3* 38.6*  MCV 92.7 95.8  PLT 221 200   No results for input(s): CKTOTAL, CKMB, TROPONINI in the last 72 hours.  Invalid input(s): POCBNP No results for input(s): HGBA1C in the last 72 hours.    Weights: Filed Weights   02/06/21 1727  Weight: 81.6 kg     Radiology/Studies:  DG Chest 1 View  Result Date: 02/10/2021 CLINICAL DATA:  Pneumonia, history of CHF EXAM: CHEST  1 VIEW COMPARISON:  October 27 FINDINGS: The heart measures at the upper limit of normal. There is increased right lower lobe consolidation. A small right pleural effusion cannot be excluded. No left pleural effusion. No pneumothorax. No addition to the increased consolidation the right lung, there is now new consolidation the mid lung. No acute osseous abnormality. IMPRESSION: Worsening right lower lobe consolidation with new left mid lung consolidation concerning for multifocal pneumonia. Electronically Signed   By: October 29 M.D.   On: 02/10/2021 10:10   CT Head Wo Contrast  Result Date: 02/06/2021 CLINICAL DATA:  Fall with weakness EXAM: CT HEAD WITHOUT CONTRAST TECHNIQUE: Contiguous axial images were obtained from the base of the skull through the vertex without intravenous contrast. COMPARISON:  CT brain 12/01/2013 FINDINGS: Brain: No acute territorial infarction, hemorrhage, or intracranial mass. Moderate atrophy. Moderate chronic small vessel ischemic changes of the white matter. Mildly prominent ventricles felt secondary to atrophy. Vascular: No hyperdense vessels. Vertebral and carotid vascular calcification Skull: Normal. Negative for fracture or focal lesion. Sinuses/Orbits: No acute finding. Other: None IMPRESSION: 1. No CT evidence for acute intracranial abnormality. 2. Atrophy and chronic small vessel ischemic changes of the white matter Electronically Signed   By: 12/03/2013 M.D.   On: 02/06/2021 18:26   DG Chest Portable 1  View  Result Date: 02/06/2021 CLINICAL DATA:  Weakness EXAM: PORTABLE CHEST 1 VIEW COMPARISON:  12/02/2013 FINDINGS: Consolidation in the right lower lobe compatible with pneumonia. Heart is normal size. Left lung clear. No effusions or acute bony abnormality. IMPRESSION: Right lower lobe pneumonia. Followup PA and lateral chest X-ray is recommended in 3-4 weeks following trial of antibiotic therapy to ensure resolution and exclude underlying malignancy. Electronically Signed   By: 12/04/2013 M.D.   On: 02/06/2021 17:53   ECHOCARDIOGRAM COMPLETE  Result Date: 02/08/2021    ECHOCARDIOGRAM REPORT   Patient Name:   ORLANDIS SANDEN Date of Exam: 02/07/2021 Medical Rec #:  02/09/2021    Height:       67.0 in Accession #:    [id number]   Weight:       180.0 lb Date of Birth:  October 17, 1935    BSA:          1.934 m Patient Age:    85 years     BP:           104/69 mmHg Patient Gender: M  HR:           63 bpm. Exam Location:  [facility] Procedure: 2D Echo Indications:     Atrial Fibrillation I48.91  History:         Patient has no prior history of Echocardiogram examinations.                  Risk Factors:Hypertension, Diabetes and Dyslipidemia.  Sonographer:     Johnathan Hausen Referring Phys:  [id number] J PATEL Diagnosing Phys: Arnoldo Hooker MD  Sonographer Comments: Technically difficult study due to poor echo windows, suboptimal parasternal window and no apical window. Image acquisition challenging due to patient body habitus. IMPRESSIONS  1. Left ventricular ejection fraction, by estimation, is 55 to 60%. The left ventricle has normal function. The left ventricle has no regional wall motion abnormalities. Left ventricular diastolic function could not be evaluated.  2. Right ventricular systolic function is normal. The right ventricular size is normal.  3. Left atrial size was mildly dilated.  4. The mitral valve is normal in structure. Mild mitral valve regurgitation.  5. The aortic valve is grossly normal.  Aortic valve regurgitation is not visualized. FINDINGS  Left Ventricle: Left ventricular ejection fraction, by estimation, is 55 to 60%. The left ventricle has normal function. The left ventricle has no regional wall motion abnormalities. The left ventricular internal cavity size was normal in size. There is  no left ventricular hypertrophy. Left ventricular diastolic function could not be evaluated. Right Ventricle: The right ventricular size is normal. No increase in right ventricular wall thickness. Right ventricular systolic function is normal. Left Atrium: Left atrial size was mildly dilated. Right Atrium: Right atrial size was normal in size. Pericardium: There is no evidence of pericardial effusion. Mitral Valve: The mitral valve is normal in structure. Mild mitral valve regurgitation. Tricuspid Valve: The tricuspid valve is normal in structure. Tricuspid valve regurgitation is trivial. Aortic Valve: The aortic valve is grossly normal. Aortic valve regurgitation is not visualized. Pulmonic Valve: The pulmonic valve was not assessed. Pulmonic valve regurgitation is not visualized. Aorta: The aortic root and ascending aorta are structurally normal, with no evidence of dilitation. IAS/Shunts: The interatrial septum was not assessed.  LEFT VENTRICLE PLAX 2D LVIDd:         4.70 cm LVIDs:         3.30 cm LV PW:         1.20 cm LV IVS:        0.70 cm  LEFT ATRIUM         Index LA diam:    3.70 cm 1.91 cm/m  TRICUSPID VALVE TR [facility] grad:   30.5 mmHg TR Vmax:        276.00 cm/s Arnoldo Hooker MD Electronically signed by Arnoldo Hooker MD Signature Date/Time: 02/08/2021/7:10:44 AM    Final    US THYROID  Result Date: 02/07/2021 CLINICAL DATA:  Elevated serum free T4 level EXAM: THYROID ULTRASOUND TECHNIQUE: Ultrasound examination of the thyroid gland and adjacent soft tissues was performed. COMPARISON:  None. FINDINGS: Parenchymal Echotexture: Moderately heterogenous Isthmus: 0.2 cm thickness Right lobe: 4.2 x  1.8 x 1.8 cm Left lobe: 3.5 x 1.5 x 1.2 cm _________________________________________________________ Estimated total number of nodules >/= 1 cm: 1 Number of spongiform nodules >/=  2 cm not described below (TR1): 0 Number of mixed cystic and solid nodules >/= 1.5 cm not described below (TR2): 0 _________________________________________________________ Nodule 1: 0.9 cm benign septated cyst, mid right Nodule # 2: Location:  Right; inferior Maximum size: 1.1 cm; Other 2 dimensions: 1.1 x 0.7 cm Composition: solid/almost completely solid (2) Echogenicity: hypoechoic (2) Shape: not taller-than-wide (0) Margins: smooth (0) Echogenic foci: macrocalcifications (1) ACR TI-RADS total points: 5. ACR TI-RADS risk category: TR4 (4-6 points). ACR TI-RADS recommendations: *Given size (>/= 1 - 1.4 cm) and appearance, a follow-up ultrasound in 1 year should be considered based on TI-RADS criteria. _________________________________________________________ Nodule 3: 0.7 cm complex cyst, mid left; This nodule does NOT meet TI-RADS criteria for biopsy or dedicated follow-up. No regional cervical adenopathy identified. IMPRESSION: 1. Normal-sized thyroid with bilateral nodules as above. None meets criteria for biopsy. 2. Recommend annual/biennial ultrasound follow-up of inferior right nodule as above, until stability x5 years confirmed. The above is in keeping with the ACR TI-RADS recommendations - J Am Coll Radiol 2017;14:587-595. Electronically Signed   By: Corlis Leak M.D.   On: 02/07/2021 12:33     Assessment and Recommendation  85 y.o. male with acute paroxysmal nonvalvular atrial fibrillation with rapid ventricular rate due to right lower lobe pneumonia with chronic kidney disease cough and congestion and no current evidence of   acute coronary syndrome although may have some basilar rales causing or contributing to patient's current worsening hypoxia  1.  Continuation of Tikosyn for possible spontaneous conversion to normal  rhythm and hopeful maintenance of normal rhythm thereafter but will need continuation of close analysis of magnesium above 2, potassium above 4, and glomerular filtration rate above 30. 2.  Continuation of diltiazem 240 mg CD for heart rate control of atrial fibrillation and possible spontaneous conversion to normal rhythm. Xarelto for anticoagulation. 3.  No further cardiac diagnostics necessary at this time 4.  Continue supportive care and treatment of pneumonia likely exacerbating above.  5.  Begin ambulation and follow improvements of symptoms  Signed, Maralyn Sago, PA-C

## 2021-02-11 NOTE — Progress Notes (Signed)
Inpatient Diabetes Program Recommendations  AACE/ADA: New Consensus Statement on Inpatient Glycemic Control (2015)  Target Ranges:  Prepandial:   less than 140 mg/dL      [facility] postprandial:   less than 180 mg/dL (1-2 hours)      Critically ill patients:  140 - 180 mg/dL   Lab Results  Component Value Date   GLUCAP 252 (H) 02/11/2021   HGBA1C 7.1 (H) 02/06/2021    Review of Glycemic Control Results for Samuel Thomas, Samuel Thomas (MRN [id number]) as of 02/11/2021 09:59  Ref. Range 02/09/2021 16:40 02/10/2021 07:54 02/10/2021 11:39 02/10/2021 17:34 02/11/2021 07:56  Glucose-Capillary Latest Ref Range: 70 - 99 mg/dL 809 (H) 983 (H) 382 (H) 286 (H) 252 (H)   Diabetes history: DM 2 Home DM Meds: Humalog 6 units TID with meals                             Metformin 1000 mg BID   Current Orders: Novolog Sensitive Correction Scale/ SSI (0-9 units) TID [facility] Inpatient Diabetes Program Recommendations:   Blood sugars>goal.  Please add Semglee 10 units daily.   Thanks,  Beryl Meager, RN, BC-ADM Inpatient Diabetes Coordinator Pager [phone]  (8a-5p)

## 2021-02-11 NOTE — TOC Progression Note (Addendum)
Transition of Care [facility]) - Progression Note    Patient Details  Name: Samuel Thomas MRN: [id number] Date of Birth: 23-Dec-1935  Transition [facility] CM/SW Contact  Maree Krabbe, LCSW Phone Number: 02/11/2021, 3:39 PM  Clinical Narrative:  CSW spoke with pt and pt's grandson Onalee Hua at great length at bedside. Onalee Hua was concerned about long term plan for pt. Pt lives at [facility]. Pt is on the list for [employer] home (per Lucila Maine is number 50 a couple months ago). Pt needs SNF. Pt is still in his right mind and can make his own decisions. CSW proposed several scenarios. CSW explained that if after 20 days pt was independent enough to return home and was not next on the list for the retirement home would the pt be agreeable to return to the [place] and the pt agreed. Pt gets a check and understanding if pt finds himself in a SNF LTC they would take his check. Pt states the goal for him in rehab is for it to be short term and him physically be able to live independently in the apartment or retirement home. Grandson to be checking on where the pt is on the list for the retirement home.    CSW confirmed with [facility] at this time they have denied a bed for pt. Pt's grandson has stated to check with [facility], referral sent.    Expected Discharge Plan: Home/Self Care Barriers to Discharge: Continued Medical Work up  Expected Discharge Plan and Services Expected Discharge Plan: Home/Self Care       Living arrangements for the past 2 months: Single Family Home                                       Social Determinants of Health (SDOH) Interventions    Readmission Risk Interventions Readmission Risk Prevention Plan 02/08/2021  Transportation Screening Complete  Medication Review Oceanographer) Complete  PCP or Specialist appointment within 3-5 days of discharge Complete  HRI or Home Care Consult Complete  SW Recovery Care/Counseling Consult Complete   Palliative Care Screening Not Applicable  Skilled Nursing Facility Complete  Some recent data might be hidden

## 2021-02-11 NOTE — Progress Notes (Signed)
  Chaplain On-Call received phone call from Samuel Thomas, the patient's grandson. [phone])  Mr. Samuel Thomas stated that he wants to be named as the Financial Power of Attorney for the patient, in order that he can assist the patient with an application for Medicaid.  Chaplain told Mr. Samuel Thomas that the hospital employees who are Notaries will authorize documents for patients related only to [facility] POA, not financial matters.  Chaplain suggested to Mr. Samuel Thomas that he contact the bank which holds the financial accounts for the patient, and to inquire if there is a Education officer, museum available from the bank.  Chaplain conferred by phone with Transition of Care leader Pryor Ochoa, who agreed that this is the best information to provide to Mr. Samuel Thomas.  Chaplain Evelena Peat M.Div., [facility]

## 2021-02-11 NOTE — Progress Notes (Signed)
Occupational Therapy Treatment Patient Details Name: Samuel Thomas MRN: [id number] DOB: 09-21-1935 Today's Date: 02/11/2021   History of present illness Pt is an 85 y/o M admitted on 02/07/21 with c/c of a fall; pt also endorses another fall 4 days prior. Pt found to have a-fib with RVR & hypokalemic, PNA. PMH: COPD, DM2, HTN, hearing loss, pulmonary HTN   OT comments  Pt seen for OT tx this date. Pt initially expressed frustration about care. Active listening utilized. Pt endorses concern that he will still be able to keep his appointment on the 4th for knee injections. Pt reports that his knee pain/stiffness interferes with his daily life, including intermittent difficulty performing LB ADL tasks. Pt instructed in AE for LB ADL to improve safety/independence with bathing, dressing, and toileting. Pt verbalized understanding. Pt required VC intermittently during session to redirect to task. Tends to get sidetracked easily, sharing stories from his past but is redirectable with time/effort. Pt continues to benefit from skilled OT services to maximize safety/indep. Continue to recommend SNF at this time. Current barriers to returning home include an inaccessible apartment (2 flights of stairs to enter), limited activity tolerance, and decreased caregiver support throughout the day. Will continue to progress as able.    Recommendations for follow up therapy are one component of a multi-disciplinary discharge planning process, led by the attending physician.  Recommendations may be updated based on patient status, additional functional criteria and insurance authorization.    Follow Up Recommendations  Skilled nursing-short term rehab (<3 hours/day)    Assistance Recommended at Discharge Frequent or constant Supervision/Assistance  Equipment Recommendations  [facility]    Recommendations for Other Services      Precautions / Restrictions Precautions Precautions: Fall Restrictions Weight Bearing  Restrictions: No       Mobility Bed Mobility      General bed mobility comments: NT, up in recliner    Transfers Overall transfer level: Needs assistance Equipment used: Rolling walker (2 wheels) Transfers: Sit to/from Stand Sit to Stand: Min guard           General transfer comment: CGA for safety with standing from lower surface     Balance Overall balance assessment: Needs assistance Sitting-balance support: No upper extremity supported;Feet supported Sitting balance-Leahy Scale: Good     Standing balance support: Reliant on assistive device for balance;During functional activity Standing balance-Leahy Scale: Fair Standing balance comment: Good static, Fair dynamic standing balance requiring the AD for balance                           ADL either performed or assessed with clinical judgement   ADL                                               Vision       Perception     Praxis      Cognition Arousal/Alertness: Awake/alert Behavior During Therapy: WFL for tasks assessed/performed Overall Cognitive Status: Within Functional Limits for tasks assessed                                 General Comments: pt required cues to redirect to task          Exercises Other Exercises Other Exercises: Pt reports  his knees stiffness/soreness makes it difficult sometimes to perform LB ADL tasks. Pt instructed in AE for LB ADL to improve safety/independence with bathing, dressing, and toileting. Pt verbalized understanding.   Shoulder Instructions       General Comments      Pertinent Vitals/ Pain       Pain Assessment: No/denies pain  Home Living                                          Prior Functioning/Environment              Frequency  Min 2X/week        Progress Toward Goals  OT Goals(current goals can now be found in the care plan section)  Progress towards OT goals:  Progressing toward goals  Acute Rehab OT Goals Patient Stated Goal: to feel better OT Goal Formulation: With patient Time For Goal Achievement: 02/22/21 Potential to Achieve Goals: Good  Plan Discharge plan remains appropriate;Frequency needs to be updated    Co-evaluation                 AM-PAC OT "6 Clicks" Daily Activity     Outcome Measure   Help from another person eating meals?: None Help from another person taking care of personal grooming?: A Little Help from another person toileting, which includes using toliet, bedpan, or urinal?: A Little Help from another person bathing (including washing, rinsing, drying)?: A Little Help from another person to put on and taking off regular upper body clothing?: A Little Help from another person to put on and taking off regular lower body clothing?: A Little 6 Click Score: 19    End of Session    OT Visit Diagnosis: Other abnormalities of gait and mobility (R26.89);Muscle weakness (generalized) (M62.81);History of falling (Z91.81)   Activity Tolerance Patient tolerated treatment well   Patient Left in chair;with call bell/phone within reach;with chair alarm set   Nurse Communication Other (comment) (let unit secretary know that chair alarm was not connected to the wall unit so would not notify the desk. Secretary said she would pass along to the nurse tech.)        Time: 1884-1660 OT Time Calculation (min): 25 min  Charges: OT General Charges $OT Visit: 1 Visit OT Treatments $Self Care/Home Management : 23-37 mins  Arman Filter., MPH, MS, OTR/L ascom [phone] 02/11/21, 4:43 PM

## 2021-02-11 NOTE — Progress Notes (Signed)
[facility] Progress Note  Patient: Samuel Thomas    [id number]  DOA: 02/06/2021     Date of Service: the patient was seen and examined on 02/11/2021  Brief hospital course: Samuel Thomas is a 85 y.o. male with PMH of COPD. DMT2, HTN, Hearing loss, pulmonary HTN,  seen in ed with complaints of fall. PT had a fall today between 3-5 pm, and then his neighbor called his nephew and he called ems. Pt is alert,awake and oriented but is his baseline.  Pt states before hsi fall he was dizzy, and weak and his legs felt like water. It has happened before four days ago when he fell and was dizzy. Pt denies any eyes ears, nose or speech or gait issues. Pt states he is weak in his legs. At home has not been abel to do ADL. Daughter at bedside pt has been declining for past few weeks.  Pt has h/o c/h a/fib for 20 year, and takes [place] and Xarelto.  Subjective: Patient continues to have quite a bit of cough with clear sputum production.  Denies any pain.  Assessment and Plan: Principal Problem:   Fall Active Problems:   Tobacco abuse   Generalized weakness   Atrial fibrillation with RVR (HCC)   Essential hypertension   COPD (chronic obstructive pulmonary disease) (HCC)   Diabetes mellitus ([place])   A.FIB with RVR: S/p Cardizem IV infusion and transitioned to Cardizem CD 240 mg daily as per cardiology Cont asa and tiokosdyn. Resumed Xarelto.  Diastolic heart failure: 2 d echo LVEF 55 to 60%, no regional wall motion abnormality, Cont  tikosyn, asa, and lasix to be ordered Prn while monitoring weight , electrolyte  and kidney function.  Cardiology consult appreciated  Hypokalemic, potassium repleted Hypophosphatemia, phos repleted.  Fall/ dizziness: Orthostatic vitals negative. -PT is recommending SNF-TOC is working on it -Fall precaution// aspiration precaution.    Community-acquired pneumonia, Leukocytosis, chest x-ray consistent with right lower lobe pneumonia Patient received  ceftriaxone azithromycin in the ED which has been continued and received for 5 days. Continue Mucinex twice daily, Robitussin-DM as needed 10/31 CXR Worsening right lower lobe consolidation with new left mid lung consolidation concerning for multifocal pneumonia. Clinically patient seems to be stable, saturating well on room air, not in respiratory distress, continue to have significant amount of cough. -Switch antibiotics with Levaquin for another 3 days due to persistent of symptoms  Abnormal thyroid functions, TSH at lower end, slightly elevated FT4 level Follow free T3 Thyroid US 1. Normal-sized thyroid with bilateral nodules as above. None meets criteria for biopsy. 2. Recommend annual/biennial ultrasound follow-up of inferior right nodule as above, until stability x5 years confirmed. -Patient will need repeat TSH levels in about 4 weeks after improving from acute illness.  AKI on CKD stage III Creatinine  1.9--1.5--1.14 gradually improving Continue IV fluid for hydration  Tobacco abuse: Nicotine patch.      HTN: S/p Cardizem IV infusion, started Cardizem CD  We will continue monitor BP and titrate medications accordingly   COPD: Cont nebulizer and inhalers.    DM II; HbA1c 7.1, well controlled Held home medications for now Continue moderate sliding scale, monitor FSBG Continue diabetic diet  Iron deficiency, iron saturation 6%, started oral iron supplement.  Follow with PCP to repeat iron profile after 3 to 6 months.  [id number] and folic acid within normal range   Body mass index is 28.19 kg/m.  Nutrition Problem: Increased nutrient needs Etiology: chronic illness (COPD) Interventions:  Diet: Carb modified diet DVT Prophylaxis: Therapeutic Anticoagulation with Xarelto    Advance goals of care discussion: DNR  Family Communication:   Disposition:  Pt is from Home, admitted with fall, PNA and A. fib with RVR, still has generalized weakness, at risk for fall, which  precludes a safe discharge. Discharge to SNF, clinically stable, medically optimized and can be discharged to SNF when bed is available.   Physical Exam: General.  Frail elderly man, in no acute distress. Pulmonary.  Few scattered rhonchi bilaterally, normal respiratory effort. CV.  Regular rate and rhythm, no JVD, rub or murmur. Abdomen.  Soft, nontender, nondistended, BS positive. CNS.  Alert and oriented .  No focal neurologic deficit. Extremities.  No edema, no cyanosis, pulses intact and symmetrical. Psychiatry.  Judgment and insight appears mildly impaired  Vitals:   02/11/21 0756 02/11/21 0822 02/11/21 1121 02/11/21 1542  BP: (!) 152/82  (!) 155/93 (!) 145/68  Pulse: (!) 107  98 95  Resp: 20  20 17   Temp: 98.7 F (37.1 C)  98 F (36.7 C) 98 F (36.7 C)  TempSrc:      SpO2: 94% 94% 93% 95%  Weight:      Height:        Intake/Output Summary (Last 24 hours) at 02/11/2021 1730 Last data filed at 02/11/2021 1330 Gross per 24 hour  Intake 2832.81 ml  Output 1100 ml  Net 1732.81 ml    Filed Weights   02/06/21 1727  Weight: 81.6 kg    Data Reviewed: I have personally reviewed and interpreted daily labs, tele strips, imagings as discussed above. I reviewed all nursing notes, pharmacy notes, vitals, pertinent old records I have discussed plan of care as described above with RN and patient/family.  CBC: Recent Labs  Lab 02/07/21 0535 02/08/21 0437 02/09/21 0456 02/10/21 0600 02/11/21 0516  WBC 14.1* 10.3 8.5 8.6 9.7  HGB 12.9* 13.0 12.8* 12.6* 12.1*  HCT 39.2 38.9* 38.7* 38.3* 38.6*  MCV 91.6 94.9 92.4 92.7 95.8  PLT 180 171 197 221 200    Basic Metabolic Panel: Recent Labs  Lab 02/07/21 0535 02/07/21 1553 02/08/21 0437 02/09/21 0456 02/10/21 0600 02/11/21 0516  NA 137  --  138 138 136 137  K 3.0*  --  4.7 3.8 4.6 4.3  CL 98  --  106 103 103 106  CO2 25  --  28 26 25 26   GLUCOSE 237*  --  265* 288* 302* 281*  BUN 58*  --  49* 40* 28* 21  CREATININE  1.54*  --  1.45* 1.14 1.00 0.73  CALCIUM 8.2*  --  8.3* 8.5* 8.5* 8.6*  MG 2.0  --  2.0 1.9 1.8  --   PHOS  --  3.1 2.3* 2.9 3.0  --      Studies: No results found.  Scheduled Meds:  vitamin C  500 mg Oral Daily   aspirin EC  81 mg Oral Daily   atorvastatin  20 mg Oral QHS   budesonide  0.5 mg Nebulization Daily   diltiazem  240 mg Oral Daily   dofetilide  125 mcg Oral BID   folic acid  1 mg Oral Daily   gabapentin  200 mg Oral TID   guaiFENesin  600 mg Oral BID   insulin aspart  0-9 Units Subcutaneous TID WC   insulin glargine-yfgn  10 Units Subcutaneous QHS   iron polysaccharides  150 mg Oral Daily   levofloxacin  750 mg Oral Daily  magnesium oxide  400 mg Oral BID   multivitamin with minerals  1 tablet Oral Daily   Ensure Max Protein  11 oz Oral BID   rivaroxaban  20 mg Oral Q supper   sodium chloride flush  3 mL Intravenous Q12H   thiamine  100 mg Oral Daily   tiotropium  18 mcg Inhalation Daily   Continuous Infusions:  lactated ringers 75 mL/hr at 02/11/21 1608   PRN Meds: acetaminophen **OR** acetaminophen, albuterol, bisacodyl, guaiFENesin-dextromethorphan, hydrALAZINE, ipratropium-albuterol, ondansetron **OR** ondansetron (ZOFRAN) IV, polyethylene glycol  Time spent: 40 minutes  Author: Lorella Nimrod. MD [facility] 02/11/2021 5:30 PM  To reach On-call, see care teams to locate the attending and reach out to them via [email or web address]. If 7PM-7AM, please contact night-coverage If you still have difficulty reaching the attending provider, please page the [facility] (Director on Call) for [facility] on amion for assistance.

## 2021-02-11 NOTE — Progress Notes (Signed)
OT Cancellation Note  Patient Details Name: Samuel Thomas MRN: [id number] DOB: [birth year]   Cancelled Treatment:    Reason Eval/Treat Not Completed: Other (comment). Pt meeting with family and TOC. Will re-attempt at later date/time as pt is available.   Arman Filter., MPH, MS, OTR/L ascom [phone] 02/11/21, 3:14 PM

## 2021-02-12 LAB — CBC
HCT: 39.6 % (ref 39.0–52.0)
Hemoglobin: 12.1 g/dL — ABNORMAL LOW (ref 13.0–17.0)
MCH: 30.6 pg (ref 26.0–34.0)
MCHC: 30.6 g/dL (ref 30.0–36.0)
MCV: 100.3 fL — ABNORMAL HIGH (ref 80.0–100.0)
Platelets: 251 10*3/uL (ref 150–400)
RBC: 3.95 MIL/uL — ABNORMAL LOW (ref 4.22–5.81)
RDW: 13.6 % (ref 11.5–15.5)
WBC: 9.9 10*3/uL (ref 4.0–10.5)
nRBC: 0 % (ref 0.0–0.2)

## 2021-02-12 LAB — GLUCOSE, CAPILLARY
Glucose-Capillary: 295 mg/dL — ABNORMAL HIGH (ref 70–99)
Glucose-Capillary: 410 mg/dL — ABNORMAL HIGH (ref 70–99)
Glucose-Capillary: 264 mg/dL — ABNORMAL HIGH (ref 70–99)
Glucose-Capillary: 248 mg/dL — ABNORMAL HIGH (ref 70–99)

## 2021-02-12 LAB — BASIC METABOLIC PANEL
Anion gap: 9 (ref 5–15)
BUN: 18 mg/dL (ref 8–23)
CO2: 21 mmol/L — ABNORMAL LOW (ref 22–32)
Calcium: 8.5 mg/dL — ABNORMAL LOW (ref 8.9–10.3)
Chloride: 105 mmol/L (ref 98–111)
Creatinine, Ser: 0.87 mg/dL (ref 0.61–1.24)
GFR, Estimated: >60 mL/min (ref >60)
Glucose, Bld: 324 mg/dL — ABNORMAL HIGH (ref 70–99)
Potassium: 4.3 mmol/L (ref 3.5–5.1)
Sodium: 135 mmol/L (ref 135–145)

## 2021-02-12 LAB — MAGNESIUM: Magnesium: 1.9 mg/dL (ref 1.7–2.4)

## 2021-02-12 MED ORDER — INSULIN ASPART 100 UNIT/ML IJ SOLN
20.0000 [IU] | Freq: Once | INTRAMUSCULAR | Status: AC
Start: 2021-02-12 — End: 2021-02-12
  Administered 2021-02-12: 20 [IU] via SUBCUTANEOUS
  Filled 2021-02-12: qty 1

## 2021-02-12 MED ORDER — NEPRO/CARBSTEADY PO LIQD
237.0000 mL | Freq: Two times a day (BID) | ORAL | Status: DC
  Administered 2021-02-12 – 2021-02-14 (×4): 237 mL via ORAL

## 2021-02-12 MED ORDER — MAGNESIUM SULFATE 2 GM/50ML IV SOLN
2.0000 g | Freq: Once | INTRAVENOUS | Status: AC
  Administered 2021-02-12: 2 g via INTRAVENOUS
  Filled 2021-02-12: qty 50

## 2021-02-12 MED ORDER — INSULIN ASPART 100 UNIT/ML IJ SOLN
3.0000 [IU] | Freq: Three times a day (TID) | INTRAMUSCULAR | Status: DC
  Administered 2021-02-12 – 2021-02-14 (×5): 3 [IU] via SUBCUTANEOUS
  Filled 2021-02-12 (×6): qty 1

## 2021-02-12 MED ORDER — INSULIN ASPART 100 UNIT/ML IJ SOLN
0.0000 [IU] | Freq: Three times a day (TID) | INTRAMUSCULAR | Status: DC
  Administered 2021-02-12: 8 [IU] via SUBCUTANEOUS
  Administered 2021-02-13: 5 [IU] via SUBCUTANEOUS
  Administered 2021-02-13: 3 [IU] via SUBCUTANEOUS
  Administered 2021-02-13: 6 [IU] via SUBCUTANEOUS
  Administered 2021-02-14: 3 [IU] via SUBCUTANEOUS
  Administered 2021-02-14: 5 [IU] via SUBCUTANEOUS
  Filled 2021-02-12 (×6): qty 1

## 2021-02-12 MED ORDER — INSULIN GLARGINE-YFGN 100 UNIT/ML ~~LOC~~ SOLN
15.0000 [IU] | Freq: Every day | SUBCUTANEOUS | Status: DC
  Administered 2021-02-12 – 2021-02-14 (×3): 15 [IU] via SUBCUTANEOUS
  Filled 2021-02-12 (×3): qty 0.15

## 2021-02-12 NOTE — Progress Notes (Signed)
Physical Therapy Treatment Patient Details Name: Samuel Thomas MRN: [id number] DOB: 05/03/1935 Today's Date: 02/12/2021   History of Present Illness Pt is an 85 y/o M admitted on 02/07/21 with c/c of a fall; pt also endorses another fall 4 days prior. Pt found to have a-fib with RVR & hypokalemic, PNA. PMH: COPD, DM2, HTN, hearing loss, pulmonary HTN    PT Comments    Pt continuing to make progress towards his therapy goals. Pt limited due to poor activity tolerance, safety awareness, and decreased awareness of proper usage of assistive devices. During ambulation, pt oxygen saturation dropped to 80% and pt reporting some shortness of breath. Pt returned to room and required extended seated rest break with cues for breathing to improve oxygen saturation to 90% SpO2. RN notified of oxygen saturation with exertion and current readings in sitting. Pt continues to be at an increased risk of falls and would benefit from SNF to improve activity tolerance, strength, and endurance. Will continue to work with patient during admission to improve deficits listed above.   Recommendations for follow up therapy are one component of a multi-disciplinary discharge planning process, led by the attending physician.  Recommendations may be updated based on patient status, additional functional criteria and insurance authorization.  Follow Up Recommendations  Skilled nursing-short term rehab (<3 hours/day)     Assistance Recommended at Discharge Frequent or constant Supervision/Assistance  Equipment Recommendations  Other (comment)    Recommendations for Other Services       Precautions / Restrictions Precautions Precautions: Fall Restrictions Weight Bearing Restrictions: No     Mobility  Bed Mobility Overal bed mobility: Needs Assistance Bed Mobility: Supine to Sit     Supine to sit: Modified independent (Device/Increase time)     General bed mobility comments: Increased time and verbal cues to  complete    Transfers Overall transfer level: Needs assistance Equipment used: Rolling walker (2 wheels) Transfers: Sit to/from Stand Sit to Stand: Supervision           General transfer comment: Cues for hand placement on bed but able to complete transfer with close SBA    Ambulation/Gait Ambulation/Gait assistance: Min guard Gait Distance (Feet): 100 Feet Assistive device: Rolling walker (2 wheels) Gait Pattern/deviations: Step-through pattern;Decreased stride length;Trunk flexed Gait velocity: decreased   General Gait Details: Foot flat contact with lack of push off throughout gait. Forward flexed posture despite cues to improve upright posture and management of RW. VCs for control of RW throughout   Stairs             Wheelchair Mobility    Modified Rankin (Stroke Patients Only)       Balance Overall balance assessment: Needs assistance Sitting-balance support: No upper extremity supported;Feet supported Sitting balance-Leahy Scale: Good     Standing balance support: Reliant on assistive device for balance;During functional activity Standing balance-Leahy Scale: Fair                              Cognition Arousal/Alertness: Awake/alert Behavior During Therapy: WFL for tasks assessed/performed Overall Cognitive Status: Within Functional Limits for tasks assessed                                          Exercises Total Joint Exercises Ankle Circles/Pumps: AROM;Strengthening;Both;10 reps;Seated Long Arc Quad: AROM;Strengthening;Both;10 reps;Seated Marching in Standing: AROM;Strengthening;Both;10  reps;Seated    General Comments General comments (skin integrity, edema, etc.): Oxygen saturation monitored throughout treatment and during ambulation pt dropped to 80% SpO2 on Room air and reporting SOB. Pt requiring prolonged sitting rest break to return to 89%. RN notified of SpO2 drop with exertion and pt position and current  Spo2 readings in sitting.      Pertinent Vitals/Pain Pain Assessment: No/denies pain    Home Living                          Prior Function            PT Goals (current goals can now be found in the care plan section) Acute Rehab PT Goals Patient Stated Goal: get better and find a better living situation PT Goal Formulation: With patient Time For Goal Achievement: 02/22/21 Potential to Achieve Goals: Fair Progress towards PT goals: Progressing toward goals    Frequency    Min 2X/week      PT Plan Current plan remains appropriate    Co-evaluation              AM-PAC PT "6 Clicks" Mobility   Outcome Measure  Help needed turning from your back to your side while in a flat bed without using bedrails?: None Help needed moving from lying on your back to sitting on the side of a flat bed without using bedrails?: None Help needed moving to and from a bed to a chair (including a wheelchair)?: A Little Help needed standing up from a chair using your arms (e.g., wheelchair or bedside chair)?: A Little Help needed to walk in hospital room?: A Little Help needed climbing 3-5 steps with a railing? : Total 6 Click Score: 18    End of Session Equipment Utilized During Treatment: Gait belt Activity Tolerance: Patient limited by fatigue Patient left: in chair;with call bell/phone within reach;with chair alarm set Nurse Communication: Mobility status PT Visit Diagnosis: Muscle weakness (generalized) (M62.81);Unsteadiness on feet (R26.81);History of falling (Z91.81)     Time: 8099-8338 PT Time Calculation (min) (ACUTE ONLY): 28 min  Charges:  $Gait Training: 8-22 mins $Therapeutic Exercise: 8-22 mins                     Verl Blalock, SPT   Verl Blalock 02/12/2021, 12:27 PM

## 2021-02-12 NOTE — TOC Progression Note (Signed)
Transition of Care [facility]) - Progression Note    Patient Details  Name: Samuel Thomas MRN: [id number] Date of Birth: [birth year]  Transition of Care [facility]) CM/SW Contact  Chapman Fitch, RN Phone Number: 02/12/2021, 3:33 PM  Clinical Narrative:     Did not hear back from [employer]. New auth started  Expected Discharge Plan: Home/Self Care Barriers to Discharge: Continued Medical Work up  Expected Discharge Plan and Services Expected Discharge Plan: Home/Self Care       Living arrangements for the past 2 months: Single Family Home                                       Social Determinants of Health (SDOH) Interventions    Readmission Risk Interventions Readmission Risk Prevention Plan 02/08/2021  Transportation Screening Complete  Medication Review Oceanographer) Complete  PCP or Specialist appointment within 3-5 days of discharge Complete  HRI or Home Care Consult Complete  SW Recovery Care/Counseling Consult Complete  Palliative Care Screening Not Applicable  Skilled Nursing Facility Complete  Some recent data might be hidden

## 2021-02-12 NOTE — Progress Notes (Signed)
Inpatient Diabetes Program Recommendations  AACE/ADA: New Consensus Statement on Inpatient Glycemic Control (2015)  Target Ranges:  Prepandial:   less than 140 mg/dL      [facility] postprandial:   less than 180 mg/dL (1-2 hours)      Critically ill patients:  140 - 180 mg/dL   Lab Results  Component Value Date   GLUCAP 295 (H) 02/12/2021   HGBA1C 7.1 (H) 02/06/2021    Review of Glycemic Control Results for LEX, LINHARES (MRN [id number]) as of 02/12/2021 09:18  Ref. Range 02/11/2021 07:56 02/11/2021 11:31 02/11/2021 15:39 02/11/2021 20:09 02/12/2021 07:33  Glucose-Capillary Latest Ref Range: 70 - 99 mg/dL 779 (H) 390 (H) 300 (H) 297 (H) 295 (H)   Diabetes history: DM 2 Home DM Meds: Humalog 6 units TID with meals                             Metformin 1000 mg BID   Current Orders: Novolog Sensitive Correction Scale/ SSI (0-9 units) TID AC, Semglee 10 units daily Inpatient Diabetes Program Recommendations:   Consider increasing Semglee to 15 units daily. Also consider adding Novolog 3 units tid with meals (hold if patient eats less than 50% or  NPO).   Thanks, Beryl Meager, RN, BC-ADM Inpatient Diabetes Coordinator Pager [phone]  (8a-5p)

## 2021-02-12 NOTE — TOC Progression Note (Signed)
Transition of Care [facility]) - Progression Note    Patient Details  Name: Samuel Thomas MRN: [id number] Date of Birth: Sep 04, 1935  [facility]) CM/SW Contact  Chapman Fitch, RN Phone Number: 02/12/2021, 1:20 PM  Clinical Narrative:    [facility] can offer a bed Grand[facility] and Patient accepts bed.  Per In[facility] Fermo at [facility] states they can accept patient tomorrow  Reached out to [place] via portal "Accepted bed at [facility] in [place] They can accept him tomorrow  Can this auth be extended till tomorrow, or do I have to submit a new Berkley Harvey?"  MD to order covid test    Expected Discharge Plan: Home/Self Care Barriers to Discharge: Continued Medical Work up  Expected Discharge Plan and Services Expected Discharge Plan: Home/Self Care       Living arrangements for the past 2 months: Single Family Home                                       Social Determinants of Health (SDOH) Interventions    Readmission Risk Interventions Readmission Risk Prevention Plan 02/08/2021  Transportation Screening Complete  Medication Review Oceanographer) Complete  PCP or Specialist appointment within 3-5 days of discharge Complete  HRI or Home Care Consult Complete  SW Recovery Care/Counseling Consult Complete  Palliative Care Screening Not Applicable  Skilled Nursing Facility Complete  Some recent data might be hidden

## 2021-02-12 NOTE — Progress Notes (Signed)
[facility] Cardiology [facility] Encounter Note  Patient: Samuel Thomas / Admit Date: 02/06/2021 / Date of Encounter: 02/12/2021, 10:08 AM   Subjective: Patient has been doing fairly well now in the last 24 hours with increase pulmonary toilet as well as now spontaneously converted to normal sinus rhythm with no evidence of rapid rate.  No evidence of chest pain or congestive heart failure type symptoms at this time  Review of Systems: Positive for: Shortness of breath Negative for: Vision change, hearing change, syncope, dizziness, nausea, vomiting,diarrhea, bloody stool, stomach pain, cough, congestion, diaphoresis, urinary frequency, urinary pain,skin lesions, skin rashes Others previously listed  Objective:  Telemetry: Atrial fibrillation with rapid rate Physical Exam: Blood pressure (!) 157/73, pulse 90, temperature 97.9 F (36.6 C), resp. rate 16, height 5\' 7"  (1.702 m), weight 81.6 kg, SpO2 91 %. Body mass index is 28.19 kg/m. General: Well developed, well nourished, in no acute distress. Head: Normocephalic, atraumatic, sclera non-icteric, no xanthomas, nares are without discharge. Neck: No apparent masses Lungs: Normal respirations with no wheezes, diffuse rhonchi, no rales , no crackles   Heart: Irregular rate and rhythm, normal S1 S2, no murmur, no rub, no gallop, PMI is normal size and placement, carotid upstroke normal without bruit, jugular venous pressure normal Abdomen: Soft, non-tender, non-distended with normoactive bowel sounds. No hepatosplenomegaly. Abdominal aorta is normal size without bruit Extremities: No edema, no clubbing, no cyanosis, no ulcers,  Peripheral: 2+ radial, 2+ femoral, 2+ dorsal pedal pulses Neuro: Alert and oriented. Moves all extremities spontaneously. Psych:  Responds to questions appropriately with a normal affect.   Intake/Output Summary (Last 24 hours) at 02/12/2021 1008 Last data filed at 02/12/2021 0042 Gross per 24 hour  Intake 1439.2  ml  Output 1100 ml  Net 339.2 ml     Inpatient Medications:  . vitamin C  500 mg Oral Daily  . aspirin EC  81 mg Oral Daily  . atorvastatin  20 mg Oral QHS  . budesonide  0.5 mg Nebulization Daily  . diltiazem  240 mg Oral Daily  . dofetilide  125 mcg Oral BID  . folic acid  1 mg Oral Daily  . gabapentin  200 mg Oral TID  . guaiFENesin  600 mg Oral BID  . insulin aspart  0-15 Units Subcutaneous TID WC  . insulin aspart  3 Units Subcutaneous TID WC  . insulin glargine-yfgn  15 Units Subcutaneous Daily  . iron polysaccharides  150 mg Oral Daily  . levofloxacin  750 mg Oral Daily  . magnesium oxide  400 mg Oral BID  . multivitamin with minerals  1 tablet Oral Daily  . Ensure Max Protein  11 oz Oral BID  . rivaroxaban  20 mg Oral Q supper  . sodium chloride flush  3 mL Intravenous Q12H  . thiamine  100 mg Oral Daily  . tiotropium  18 mcg Inhalation Daily   Infusions:  . magnesium sulfate bolus IVPB      Labs: Recent Labs    02/10/21 0600 02/11/21 0516 02/12/21 0528  NA 136 137 135  K 4.6 4.3 4.3  CL 103 106 105  CO2 25 26 21*  GLUCOSE 302* 281* 324*  BUN 28* 21 18  CREATININE 1.00 0.73 0.87  CALCIUM 8.5* 8.6* 8.5*  MG 1.8  --  1.9  PHOS 3.0  --   --     No results for input(s): AST, ALT, ALKPHOS, BILITOT, PROT, ALBUMIN in the last 72 hours. Recent Labs  02/11/21 0516 02/12/21 0528  WBC 9.7 9.9  HGB 12.1* 12.1*  HCT 38.6* 39.6  MCV 95.8 100.3*  PLT 200 251    No results for input(s): CKTOTAL, CKMB, TROPONINI in the last 72 hours.  Invalid input(s): POCBNP No results for input(s): HGBA1C in the last 72 hours.    Weights: Filed Weights   02/06/21 1727  Weight: 81.6 kg     Radiology/Studies:  DG Chest 1 View  Result Date: 02/10/2021 CLINICAL DATA:  Pneumonia, history of CHF EXAM: CHEST  1 VIEW COMPARISON:  October 27 FINDINGS: The heart measures at the upper limit of normal. There is increased right lower lobe consolidation. A small right  pleural effusion cannot be excluded. No left pleural effusion. No pneumothorax. No addition to the increased consolidation the right lung, there is now new consolidation the mid lung. No acute osseous abnormality. IMPRESSION: Worsening right lower lobe consolidation with new left mid lung consolidation concerning for multifocal pneumonia. Electronically Signed   By: Olive Bass M.D.   On: 02/10/2021 10:10   CT Head Wo Contrast  Result Date: 02/06/2021 CLINICAL DATA:  Fall with weakness EXAM: CT HEAD WITHOUT CONTRAST TECHNIQUE: Contiguous axial images were obtained from the base of the skull through the vertex without intravenous contrast. COMPARISON:  CT brain 12/01/2013 FINDINGS: Brain: No acute territorial infarction, hemorrhage, or intracranial mass. Moderate atrophy. Moderate chronic small vessel ischemic changes of the white matter. Mildly prominent ventricles felt secondary to atrophy. Vascular: No hyperdense vessels. Vertebral and carotid vascular calcification Skull: Normal. Negative for fracture or focal lesion. Sinuses/Orbits: No acute finding. Other: None IMPRESSION: 1. No CT evidence for acute intracranial abnormality. 2. Atrophy and chronic small vessel ischemic changes of the white matter Electronically Signed   By: Jasmine Pang M.D.   On: 02/06/2021 18:26   DG Chest Portable 1 View  Result Date: 02/06/2021 CLINICAL DATA:  Weakness EXAM: PORTABLE CHEST 1 VIEW COMPARISON:  12/02/2013 FINDINGS: Consolidation in the right lower lobe compatible with pneumonia. Heart is normal size. Left lung clear. No effusions or acute bony abnormality. IMPRESSION: Right lower lobe pneumonia. Followup PA and lateral chest X-ray is recommended in 3-4 weeks following trial of antibiotic therapy to ensure resolution and exclude underlying malignancy. Electronically Signed   By: Charlett Nose M.D.   On: 02/06/2021 17:53   ECHOCARDIOGRAM COMPLETE  Result Date: 02/08/2021    ECHOCARDIOGRAM REPORT   Patient  Name:   TYSIN Thomas Date of Exam: 02/07/2021 Medical Rec #:  [id number]    Height:       67.0 in Accession #:    [id number]   Weight:       180.0 lb Date of Birth:  02-17-1936    BSA:          1.934 m Patient Age:    85 years     BP:           104/69 mmHg Patient Gender: M            HR:           63 bpm. Exam Location:  [facility] Procedure: 2D Echo Indications:     Atrial Fibrillation I48.91  History:         Patient has no prior history of Echocardiogram examinations.                  Risk Factors:Hypertension, Diabetes and Dyslipidemia.  Sonographer:     Johnathan Hausen Referring Phys:  [id number]  PATEL Diagnosing Phys: Arnoldo Hooker MD  Sonographer Comments: Technically difficult study due to poor echo windows, suboptimal parasternal window and no apical window. Image acquisition challenging due to patient body habitus. IMPRESSIONS  1. Left ventricular ejection fraction, by estimation, is 55 to 60%. The left ventricle has normal function. The left ventricle has no regional wall motion abnormalities. Left ventricular diastolic function could not be evaluated.  2. Right ventricular systolic function is normal. The right ventricular size is normal.  3. Left atrial size was mildly dilated.  4. The mitral valve is normal in structure. Mild mitral valve regurgitation.  5. The aortic valve is grossly normal. Aortic valve regurgitation is not visualized. FINDINGS  Left Ventricle: Left ventricular ejection fraction, by estimation, is 55 to 60%. The left ventricle has normal function. The left ventricle has no regional wall motion abnormalities. The left ventricular internal cavity size was normal in size. There is  no left ventricular hypertrophy. Left ventricular diastolic function could not be evaluated. Right Ventricle: The right ventricular size is normal. No increase in right ventricular wall thickness. Right ventricular systolic function is normal. Left Atrium: Left atrial size was mildly dilated. Right Atrium: Right  atrial size was normal in size. Pericardium: There is no evidence of pericardial effusion. Mitral Valve: The mitral valve is normal in structure. Mild mitral valve regurgitation. Tricuspid Valve: The tricuspid valve is normal in structure. Tricuspid valve regurgitation is trivial. Aortic Valve: The aortic valve is grossly normal. Aortic valve regurgitation is not visualized. Pulmonic Valve: The pulmonic valve was not assessed. Pulmonic valve regurgitation is not visualized. Aorta: The aortic root and ascending aorta are structurally normal, with no evidence of dilitation. IAS/Shunts: The interatrial septum was not assessed.  LEFT VENTRICLE PLAX 2D LVIDd:         4.70 cm LVIDs:         3.30 cm LV PW:         1.20 cm LV IVS:        0.70 cm  LEFT ATRIUM         Index LA diam:    3.70 cm 1.91 cm/m  TRICUSPID VALVE TR Peak grad:   30.5 mmHg TR Vmax:        276.00 cm/s Arnoldo Hooker MD Electronically signed by Arnoldo Hooker MD Signature Date/Time: 02/08/2021/7:10:44 AM    Final    US THYROID  Result Date: 02/07/2021 CLINICAL DATA:  Elevated serum free T4 level EXAM: THYROID ULTRASOUND TECHNIQUE: Ultrasound examination of the thyroid gland and adjacent soft tissues was performed. COMPARISON:  None. FINDINGS: Parenchymal Echotexture: Moderately heterogenous Isthmus: 0.2 cm thickness Right lobe: 4.2 x 1.8 x 1.8 cm Left lobe: 3.5 x 1.5 x 1.2 cm _________________________________________________________ Estimated total number of nodules >/= 1 cm: 1 Number of spongiform nodules >/=  2 cm not described below (TR1): 0 Number of mixed cystic and solid nodules >/= 1.5 cm not described below (TR2): 0 _________________________________________________________ Nodule 1: 0.9 cm benign septated cyst, mid right Nodule # 2: Location: Right; inferior Maximum size: 1.1 cm; Other 2 dimensions: 1.1 x 0.7 cm Composition: solid/almost completely solid (2) Echogenicity: hypoechoic (2) Shape: not taller-than-wide (0) Margins: smooth (0)  Echogenic foci: macrocalcifications (1) ACR TI-RADS total points: 5. ACR TI-RADS risk category: TR4 (4-6 points). ACR TI-RADS recommendations: *Given size (>/= 1 - 1.4 cm) and appearance, a follow-up ultrasound in 1 year should be considered based on TI-RADS criteria. _________________________________________________________ Nodule 3: 0.7 cm complex cyst, mid left; This nodule does NOT  meet TI-RADS criteria for biopsy or dedicated follow-up. No regional cervical adenopathy identified. IMPRESSION: 1. Normal-sized thyroid with bilateral nodules as above. None meets criteria for biopsy. 2. Recommend annual/biennial ultrasound follow-up of inferior right nodule as above, until stability x5 years confirmed. The above is in keeping with the ACR TI-RADS recommendations - J Am Coll Radiol 2017;14:587-595. Electronically Signed   By: Corlis Leak M.D.   On: 02/07/2021 12:33     Assessment and Recommendation  84 y.o. male with acute paroxysmal nonvalvular atrial fibrillation with rapid ventricular rate due to right lower lobe pneumonia with chronic kidney disease cough and congestion and no current evidence of   acute coronary syndrome although may have some basilar rails causing or contributing to patient's current worsening hypoxia 1.  Continuation of Tikosyn for possible spontaneous conversion to normal rhythm and hopeful maintenance of normal rhythm thereafter but will need continuation of close analysis of magnesium above 2, potassium above 4, and glomerular filtration rate above 30. 2.  Continuation of diltiazem 240 mg CD for heart rate control of atrial fibrillation and maintaining normal sinus rhythm 3.  No further cardiac diagnostics necessary at this time 4.  Continuation of medication management and supportive care of pneumonia 5.  Begin ambulation and follow improvements of symptoms 6.  Anticoagulation for further risk reduction stroke with atrial fibrillation with using Xarelto Signed, Arnoldo Hooker  M.D. FACC

## 2021-02-12 NOTE — Progress Notes (Signed)
Nutrition Follow Up Note   DOCUMENTATION CODES:   Not applicable  INTERVENTION:   Nepro Shake po BID, each supplement provides 425 kcal and 19 grams protein  MVI, folic acid and thiamine po daily   Pt at high refeed risk; recommend monitor potassium, magnesium and phosphorus labs daily until stable  NUTRITION DIAGNOSIS:   Increased nutrient needs related to chronic illness (COPD) as evidenced by estimated needs.  GOAL:   Patient will meet greater than or equal to 90% of their needs -not met   MONITOR:   PO intake, Supplement acceptance, Labs, Weight trends, Skin, I & O's  ASSESSMENT:   85 y/o male with h/o COPD, DM, HTN, BPH, CHF, divericulitis, OSA and etoh and subtance abuse who is admitted with fall.  Met with pt in room today. Pt reports fairly good appetite and oral intake at baseline but reports that he does not like the hospital food. RD suspects pt with poor oral intake at baseline r/t etoh abuse. Pt reports that he ate most of his breakfast this morning but reports only eating his salad for lunch because he did not like the food. Pt reports that he does not like the Ensure but he is willing to try butter pecan Nepro as he reports that he loves butter pecan. Pt is documented to be eating only sips and bites of meals. RD discussed with pt the importance of adequate nutrition needed to preserve lean muscle. Pt requesting a hamburger for dinner; RD ordered. RD will change pt over to Nepro. Pt remains at high refeed risk.   Medications reviewed and include: aspirin, insulin, azithromycin, ceftriaxone  Labs reviewed: K 3.0(L), BUN 58(H), creat 1.54(H), Mg 2.0 wnl Wbc- 14.1(H) Cbgs- 217, 223, 262 x 24 hrs AIC 7.1(H)- 10/27  NUTRITION - FOCUSED PHYSICAL EXAM:  Flowsheet Row Most Recent Value  Orbital Region No depletion  Upper Arm Region No depletion  Thoracic and Lumbar Region No depletion  Buccal Region No depletion  Temple Region Mild depletion  Clavicle Bone  Region Mild depletion  Clavicle and Acromion Bone Region Mild depletion  Scapular Bone Region No depletion  Dorsal Hand No depletion  Patellar Region Mild depletion  Anterior Thigh Region Mild depletion  Posterior Calf Region Mild depletion  Edema (RD Assessment) None  Hair Reviewed  Eyes Reviewed  Mouth Reviewed  Skin Reviewed  Nails Reviewed   Diet Order:   Diet Order             Diet Carb Modified Fluid consistency: Thin; Room service appropriate? Yes  Diet effective now                  EDUCATION NEEDS:   No education needs have been identified at this time  Skin:  Skin Assessment: Reviewed RN Assessment  Last BM:  11/1- type 6  Height:   Ht Readings from Last 1 Encounters:  02/06/21 5' 7"  (1.702 m)    Weight:   Wt Readings from Last 1 Encounters:  02/06/21 81.6 kg    Ideal Body Weight:  67.27 kg  BMI:  Body mass index is 28.19 kg/m.  Estimated Nutritional Needs:   Kcal:  1800-2100kcal/day  Protein:  90-105g/day  Fluid:  1.7-2.0L/day  Koleen Distance MS, RD, LDN Please refer to [facility] for RD and/or RD on-call/weekend/after hours pager

## 2021-02-12 NOTE — Progress Notes (Signed)
[facility] Progress Note  Patient: Samuel Thomas    [id number]  DOA: 02/06/2021     Date of Service: the patient was seen and examined on 02/12/2021  Brief hospital course: Samuel Thomas is a 85 y.o. male with PMH of COPD. DMT2, HTN, Hearing loss, pulmonary HTN,  seen in ed with complaints of fall. PT had a fall today between 3-5 pm, and then his neighbor called his nephew and he called ems. Pt is alert,awake and oriented but is his baseline.  Pt states before hsi fall he was dizzy, and weak and his legs felt like water. It has happened before four days ago when he fell and was dizzy. Pt denies any eyes ears, nose or speech or gait issues. Pt states he is weak in his legs. At home has not been abel to do ADL. Daughter at bedside pt has been declining for past few weeks.  Pt has h/o c/h a/fib for 20 year, and takes [place] and Xarelto.  11/2: Medically stable for discharge, had a bed offer for tomorrow, pending insurance authorization.  Subjective:  Patient was feeling much improved when seen today.  Sitting in chair comfortably.  Cough is improving.  Able to work with PT.  Assessment and Plan: Principal Problem:   Fall Active Problems:   Tobacco abuse   Generalized weakness   Atrial fibrillation with RVR (HCC)   Essential hypertension   COPD (chronic obstructive pulmonary disease) (HCC)   Diabetes mellitus ([place])   A.FIB with RVR: S/p Cardizem IV infusion and transitioned to Cardizem CD 240 mg daily as per cardiology Cont asa and tiokosdyn. Resumed Xarelto.  Diastolic heart failure: 2 d echo LVEF 55 to 60%, no regional wall motion abnormality, Cont  tikosyn, asa, and lasix to be ordered Prn while monitoring weight , electrolyte  and kidney function.  Cardiology consult appreciated  Hypokalemic, potassium repleted Hypophosphatemia, phos repleted.  Fall/ dizziness: Orthostatic vitals negative. -PT is recommending SNF-TOC is working on it -Fall precaution//  aspiration precaution.    Community-acquired pneumonia, Leukocytosis, chest x-ray consistent with right lower lobe pneumonia Patient received ceftriaxone azithromycin in the ED which has been continued and received for 5 days. Continue Mucinex twice daily, Robitussin-DM as needed 10/31 CXR Worsening right lower lobe consolidation with new left mid lung consolidation concerning for multifocal pneumonia. Clinically patient seems to be stable, saturating well on room air, not in respiratory distress, continue to have significant amount of cough. -Switch antibiotics with Levaquin for another 3 days due to persistent of symptoms, will complete the course tomorrow.  Abnormal thyroid functions, TSH at lower end, slightly elevated FT4 level Follow free T3 Thyroid US 1. Normal-sized thyroid with bilateral nodules as above. None meets criteria for biopsy. 2. Recommend annual/biennial ultrasound follow-up of inferior right nodule as above, until stability x5 years confirmed. -Patient will need repeat TSH levels in about 4 weeks after improving from acute illness.  AKI on CKD stage III Creatinine  1.9--1.5--1.14 gradually improving Continue IV fluid for hydration  Tobacco abuse: Nicotine patch.     HTN: Blood pressure within goal. -Continue with current management.   COPD: Cont nebulizer and inhalers.    DM II; HbA1c 7.1, CBG elevated today. -Increase the dose of Semglee to 15 units. -Add 3 units of NovoLog for mealtime coverage. -Continue moderate sliding scale, monitor FSBG Continue diabetic diet  Iron deficiency, iron saturation 6%, started oral iron supplement.  Follow with PCP to repeat iron profile after 3 to 6  months.  B12 and folic acid within normal range   Body mass index is 28.19 kg/m.  Nutrition Problem: Increased nutrient needs Etiology: chronic illness (COPD) Interventions:    Diet: Carb modified diet DVT Prophylaxis: Therapeutic Anticoagulation with Xarelto     Advance goals of care discussion: DNR  Family Communication:   Disposition:  Pt is from Home, admitted with fall, PNA and A. fib with RVR, still has generalized weakness, at risk for fall, which precludes a safe discharge. Discharge to SNF, clinically stable, medically optimized and can be discharged to SNF when bed is available.   Physical Exam: General.  Pleasant elderly man, in no acute distress. Pulmonary.  Mostly clear lungs with very few scattered rhonchi, normal respiratory effort. CV.  Regular rate and rhythm, no JVD, rub or murmur. Abdomen.  Soft, nontender, nondistended, BS positive. CNS.  Alert and oriented .  No focal neurologic deficit. Extremities.  No edema, no cyanosis, pulses intact and symmetrical. Psychiatry.  Judgment and insight appears normal.   Vitals:   02/12/21 0704 02/12/21 0732 02/12/21 0748 02/12/21 1151  BP: (!) 156/74 (!) 157/73  (!) 144/63  Pulse: 90 90  95  Resp:  16  17  Temp:  97.9 F (36.6 C)  98 F (36.7 C)  TempSrc:      SpO2: 93% 94% 91% 99%  Weight:      Height:        Intake/Output Summary (Last 24 hours) at 02/12/2021 1352 Last data filed at 02/12/2021 0042 Gross per 24 hour  Intake 539.2 ml  Output 1000 ml  Net -460.8 ml    Filed Weights   02/06/21 1727  Weight: 81.6 kg    Data Reviewed: I have personally reviewed and interpreted daily labs, tele strips, imagings as discussed above. I reviewed all nursing notes, pharmacy notes, vitals, pertinent old records I have discussed plan of care as described above with RN and patient/family.  CBC: Recent Labs  Lab 02/08/21 0437 02/09/21 0456 02/10/21 0600 02/11/21 0516 02/12/21 0528  WBC 10.3 8.5 8.6 9.7 9.9  HGB 13.0 12.8* 12.6* 12.1* 12.1*  HCT 38.9* 38.7* 38.3* 38.6* 39.6  MCV 94.9 92.4 92.7 95.8 100.3*  PLT 171 197 221 200 251    Basic Metabolic Panel: Recent Labs  Lab 02/07/21 0535 02/07/21 1553 02/08/21 0437 02/09/21 0456 02/10/21 0600 02/11/21 0516  02/12/21 0528  NA 137  --  138 138 136 137 135  K 3.0*  --  4.7 3.8 4.6 4.3 4.3  CL 98  --  106 103 103 106 105  CO2 25  --  28 26 25 26  21*  GLUCOSE 237*  --  265* 288* 302* 281* 324*  BUN 58*  --  49* 40* 28* 21 18  CREATININE 1.54*  --  1.45* 1.14 1.00 0.73 0.87  CALCIUM 8.2*  --  8.3* 8.5* 8.5* 8.6* 8.5*  MG 2.0  --  2.0 1.9 1.8  --  1.9  PHOS  --  3.1 2.3* 2.9 3.0  --   --      Studies: No results found.  Scheduled Meds:  vitamin C  500 mg Oral Daily   aspirin EC  81 mg Oral Daily   atorvastatin  20 mg Oral QHS   budesonide  0.5 mg Nebulization Daily   diltiazem  240 mg Oral Daily   dofetilide  125 mcg Oral BID   folic acid  1 mg Oral Daily   gabapentin  200 mg Oral  TID   guaiFENesin  600 mg Oral BID   insulin aspart  0-15 Units Subcutaneous TID WC   insulin aspart  3 Units Subcutaneous TID WC   insulin glargine-yfgn  15 Units Subcutaneous Daily   iron polysaccharides  150 mg Oral Daily   levofloxacin  750 mg Oral Daily   magnesium oxide  400 mg Oral BID   multivitamin with minerals  1 tablet Oral Daily   Ensure Max Protein  11 oz Oral BID   rivaroxaban  20 mg Oral Q supper   sodium chloride flush  3 mL Intravenous Q12H   thiamine  100 mg Oral Daily   tiotropium  18 mcg Inhalation Daily   Continuous Infusions:   PRN Meds: acetaminophen **OR** acetaminophen, albuterol, bisacodyl, guaiFENesin-dextromethorphan, hydrALAZINE, ipratropium-albuterol, ondansetron **OR** ondansetron (ZOFRAN) IV, polyethylene glycol  Time spent: 35 minutes  This record has been created using Systems analyst. Errors have been sought and corrected,but may not always be located. Such creation errors do not reflect on the standard of care.   Author: Lorella Nimrod. MD [facility] 02/12/2021 1:52 PM  To reach On-call, see care teams to locate the attending and reach out to them via [email or web address]. If 7PM-7AM, please contact night-coverage If you still have difficulty  reaching the attending provider, please page the [facility] (Director on Call) for [facility] on amion for assistance.

## 2021-02-13 DIAGNOSIS — J189 Pneumonia, unspecified organism: Secondary | ICD-10-CM

## 2021-02-13 DIAGNOSIS — E119 Type 2 diabetes mellitus without complications: Secondary | ICD-10-CM

## 2021-02-13 DIAGNOSIS — Z794 Long term (current) use of insulin: Secondary | ICD-10-CM

## 2021-02-13 LAB — BASIC METABOLIC PANEL
Anion gap: 8 (ref 5–15)
BUN: 16 mg/dL (ref 8–23)
CO2: 30 mmol/L (ref 22–32)
Calcium: 8.6 mg/dL — ABNORMAL LOW (ref 8.9–10.3)
Chloride: 102 mmol/L (ref 98–111)
Creatinine, Ser: 0.92 mg/dL (ref 0.61–1.24)
GFR, Estimated: >60 mL/min (ref >60)
Glucose, Bld: 171 mg/dL — ABNORMAL HIGH (ref 70–99)
Potassium: 4 mmol/L (ref 3.5–5.1)
Sodium: 140 mmol/L (ref 135–145)

## 2021-02-13 LAB — GLUCOSE, CAPILLARY
Glucose-Capillary: 181 mg/dL — ABNORMAL HIGH (ref 70–99)
Glucose-Capillary: 252 mg/dL — ABNORMAL HIGH (ref 70–99)
Glucose-Capillary: 166 mg/dL — ABNORMAL HIGH (ref 70–99)
Glucose-Capillary: 273 mg/dL — ABNORMAL HIGH (ref 70–99)

## 2021-02-13 LAB — SARS CORONAVIRUS 2 (TAT 6-24 HRS): SARS Coronavirus 2: NEGATIVE

## 2021-02-13 MED ORDER — DILTIAZEM HCL ER COATED BEADS 240 MG PO CP24: 240.0000 mg | ORAL_CAPSULE | Freq: Every day | ORAL | Status: AC

## 2021-02-13 MED ORDER — POLYETHYLENE GLYCOL 3350 17 G PO PACK
17.0000 g | PACK | Freq: Once | ORAL | Status: AC
  Administered 2021-02-13: 17 g via ORAL
  Filled 2021-02-13: qty 1

## 2021-02-13 MED ORDER — NEPRO/CARBSTEADY PO LIQD: 237.0000 mL | Freq: Two times a day (BID) | ORAL | 0 refills | Status: AC

## 2021-02-13 MED ORDER — POLYETHYLENE GLYCOL 3350 17 G PO PACK: 17.0000 g | PACK | Freq: Every day | ORAL | 0 refills | Status: AC | PRN

## 2021-02-13 MED ORDER — RIVAROXABAN 20 MG PO TABS: 20.0000 mg | ORAL_TABLET | Freq: Every day | ORAL | Status: AC

## 2021-02-13 MED ORDER — MAGNESIUM OXIDE -MG SUPPLEMENT 400 (240 MG) MG PO TABS: 400.0000 mg | ORAL_TABLET | Freq: Two times a day (BID) | ORAL | Status: AC

## 2021-02-13 MED ORDER — FERROUS SULFATE-C-FA ER 105-500-0.8 MG PO TBCR: 1.0000 | EXTENDED_RELEASE_TABLET | Freq: Two times a day (BID) | ORAL | 0 refills | Status: DC

## 2021-02-13 MED ORDER — GUAIFENESIN-DM 100-10 MG/5ML PO SYRP: 10.0000 mL | ORAL_SOLUTION | ORAL | 0 refills | Status: AC | PRN

## 2021-02-13 NOTE — Progress Notes (Signed)
Patient to be transported to EMS but bed not ready.  IVs were already out and telemetry box already discontinued.    Patient to stay the night at [facility].  MD order to leave out IV and not to continue telemetry overnight.

## 2021-02-13 NOTE — Discharge Summary (Addendum)
Physician Discharge Summary  Samuel Thomas NMM:[id number] DOB: 22-Apr-1935 DOA: 02/06/2021  PCP: Danae Orleans, MD  Admit date: 02/06/2021 Discharge date: 02/14/2021  Admitted From: Home Disposition: SNF  Recommendations for Outpatient Follow-up:  Follow up with PCP in 1-2 weeks Follow-up with cardiology Follow-up with urology Please obtain BMP/CBC in one week Please follow up on the following pending results: None   Home Health: No Equipment/Devices: Rolling walker Discharge Condition: Stable CODE STATUS: DNR Diet recommendation: Heart Healthy / Carb Modified    Brief/Interim Summary: Samuel Thomas is a 85 y.o. male with PMH of COPD. DMT2, HTN, Hearing loss, pulmonary HTN, seen in ed with complaints of fall. PT had a fall today between 3-5 pm, and then his neighbor called his nephew and he called EMS. Pt is alert,awake and oriented but is his baseline.  Pt states before he fell he was dizzy, and weak and his legs felt like water. It has happened before four days ago when he fell and was dizzy. Pt denies any eyes ears, nose or speech or gait issues. Pt states he is weak in his legs. At home has not been able to do ADL. Per daughter patient is declining for the past few weeks.  He was admitted for community-acquired pneumonia and received 5 days of ceftriaxone and Zithromax and 3 days of Levaquin due to persistent of cough.  Symptoms improved. He will continue with supportive care and symptom management as needed and still having some cough.  Patient has an history of paroxysmal atrial fibrillation with RVR, found to be in A. fib most likely secondary to pneumonia.  Cardiology was consulted and they make some changes to his medications as he was initially requiring IV Cardizem infusion, they placed him on p.o. infusion and dose increased to 240 mg daily, heart rate improved.  Metoprolol was also added. Patient was on Tikosyn at home,  which he will continue.  There was both Eliquis and  Xarelto listed on his home meds and he was not sure what he was taking.  Eliquis was discontinued and he will continue his Xarelto.  We stopped aspirin to decrease her risk of bleeding.  He will follow-up with his cardiologist for further recommendations.  He was also found to have TSH at lower end and slightly elevated free T4.  Thyroid ultrasound with normal thyroid size and bilateral nodules, none met criteria for biopsy.  They are recommending annual/biannual ultrasound follow-up of the inferior right nodule until stability for 5 years.  He will need a repeat TSH levels in about 4 weeks after improving from acute illness and that can be done at the PCP office.  Patient also had an AKI with CKD stage IIIa.  Creatinine improved to baseline with IV hydration.  We held his home dose of Lasix during hospitalization and he can resume along with potassium supplement on discharge.  He was also found to have iron deficiency and started on supplement, he should continue the supplement and repeat iron studies in about 3 to 27-month can be done at PCP office.  Because of frequent falls over physical therapist evaluated him and they were recommending going to rehab for further management.  He is being discharged.  He will continue with rest of his home medications except mentioned above according to this new med rec and follow-up with his providers.  Discharge Diagnoses:  Principal Problem:   Fall Active Problems:   Essential hypertension   COPD (chronic obstructive pulmonary disease) ([place]  Diabetes mellitus ([place])   Atrial fibrillation with RVR ([facility])   Generalized weakness   Tobacco abuse   Pneumonia due to infectious organism   Discharge Instructions  Discharge Instructions     Diet - low sodium heart healthy   Complete by: As directed    Increase activity slowly   Complete by: As directed       Allergies as of 02/14/2021       Reactions   Penicillins Rash, Swelling   Break outs  swelling Other reaction(s): SWELLING in 1955 Break outs swelling Other reaction(s): SWELLING in 1955 Break outs swelling Other reaction(s): SWELLING in 1955 Break outs swelling Break outs swelling Other reaction(s): SWELLING in 1955 Break outs swelling Other reaction(s): SWELLING in 1955 Break outs swelling Break outs swelling Other reaction(s): SWELLING in 1955 Break outs swelling Other reaction(s): SWELLING in 1955 Break outs swelling Other reaction(s): SWELLING in 1955 Break outs swelling Break outs swelling Other reaction(s): SWELLING in 1955 Break outs swelling Other reaction(s): SWELLING in 1955 Break outs swelling Other reaction(s): SWELLING in 1955 Break outs swelling Other reaction(s): SWELLING in 1955 Break outs swelling Break outs swelling Other reaction(s): SWELLING in 1955 Break outs swelling Other reaction(s): SWELLING in 1955 Break outs swelling Break outs swelling Other reaction(s): SWELLING in 1955   Apixaban Other (See Comments)   Cold intolerance Cold intolerance   Prednisone Other (See Comments), Rash   Makes mean  Significant mood and behavioral changes - 'felt like I wasn't in control of anything' Makes mean  Significant mood and behavioral changes - 'felt like I wasn't in control of anything' Makes mean  Significant mood and behavioral changes - 'felt like I wasn't in control of anything' Significant mood and behavioral changes - 'felt like I wasn't in control of anything' Makes mean  Significant mood and behavioral changes - 'felt like I wasn't in control of anything' Makes mean  Significant mood and behavioral changes - 'felt like I wasn't in control of anything' Makes mean  Significant mood and behavioral changes - 'felt like I wasn't in control of anything' Significant mood and behavioral changes - 'felt like I wasn't in control of anything' Makes mean  Makes mean  Significant mood and behavioral changes - 'felt like I wasn't in  control of anything' Makes mean  Significant mood and behavioral changes - 'felt like I wasn't in control of anything' Makes mean  Significant mood and behavioral changes - 'felt like I wasn't in control of anything' Significant mood and behavioral changes - 'felt like I wasn't in control of anything' Makes mean  Significant mood and behavioral changes - 'felt like I wasn't in control of anything' Makes mean  Significant mood and behavioral changes - 'felt like I wasn't in control of anything' Makes mean  Significant mood and behavioral changes - 'felt like I wasn't in control of anything' Makes mean  Significant mood and behavioral changes - 'felt like I wasn't in control of anything' Makes mean  Significant mood and behavioral changes - 'felt like I wasn't in control of anything' Significant mood and behavioral changes - 'felt like I wasn't in control of anything' Makes mean  Significant mood and behavioral changes - 'felt like I wasn't in control of anything' Makes mean  Significant mood and behavioral changes - 'felt like I wasn't in control of anything' Makes mean  Significant mood and behavioral changes - 'felt like I wasn't in control of anything' Significant mood and behavioral changes - 'felt like I wasn't  in control of anything' Makes mean  Makes mean  Significant mood and behavioral changes - 'felt like I wasn't in control of anything'        Medication List     STOP taking these medications    aspirin 81 MG EC tablet   [employer] Probiotic Chew   DULoxetine 60 MG capsule Commonly known as: CYMBALTA   Eliquis 5 MG Tabs tablet Generic drug: apixaban   FLUoxetine 20 MG capsule Commonly known as: PROZAC   levalbuterol 45 MCG/ACT inhaler Commonly known as: XOPENEX HFA   loratadine 10 MG tablet Commonly known as: CLARITIN   metoprolol succinate 25 MG 24 hr tablet Commonly known as: TOPROL-XL   naloxone 4 MG/0.1ML Liqd nasal spray kit Commonly known as:  NARCAN   NIFEdipine 90 MG 24 hr tablet Commonly known as: PROCARDIA XL/NIFEDICAL-XL       TAKE these medications    Acidophilus/Pectin Caps Acidophilus Probiotic 100 million cell-10 mg capsule  Take 1 capsule every day by oral route.   albuterol 108 (90 Base) MCG/ACT inhaler Commonly known as: VENTOLIN HFA Ventolin HFA 90 mcg/actuation aerosol inhaler  Inhale 2 puffs by mouth every 6 hours as needed for wheezing   atorvastatin 20 MG tablet Commonly known as: LIPITOR Take 20 mg by mouth at bedtime.   budesonide 0.5 MG/2ML nebulizer solution Commonly known as: PULMICORT Inhale 0.5 mg into the lungs daily.   Calcium Carb-Cholecalciferol 600-400 MG-UNIT Tabs Take 1 tablet by mouth daily.   cetirizine 10 MG tablet Commonly known as: ZYRTEC cetirizine 10 mg tablet  Take 1 tablet every day by oral route.   colchicine 0.6 MG tablet Take 0.6 mg by mouth as needed (gout).   diltiazem 240 MG 24 hr capsule Commonly known as: CARDIZEM CD Take 1 capsule (240 mg total) by mouth daily.   dofetilide 125 MCG capsule Commonly known as: TIKOSYN Take 125 mcg by mouth 2 (two) times daily.   feeding supplement (NEPRO CARB STEADY) Liqd Take 237 mLs by mouth 2 (two) times daily between meals.   Ferrous Sulfate-C-Folic Acid 417-408-1.4 MG Tbcr Take 1 tablet by mouth 2 (two) times daily.   finasteride 5 MG tablet Commonly known as: PROSCAR Take 5 mg by mouth daily.   fluticasone 50 MCG/ACT nasal spray Commonly known as: FLONASE Place 1-2 sprays into both nostrils daily.   furosemide 40 MG tablet Commonly known as: LASIX Take 1 tablet (40 mg total) by mouth daily.   gabapentin 300 MG capsule Commonly known as: NEURONTIN Take 300 mg by mouth 3 (three) times daily.   guaiFENesin-dextromethorphan 100-10 MG/5ML syrup Commonly known as: ROBITUSSIN DM Take 10 mLs by mouth every 4 (four) hours as needed for cough.   insulin lispro 100 UNIT/ML injection Commonly known as:  HUMALOG Inject 6 Units into the skin 3 (three) times daily before meals.   ipratropium-albuterol 0.5-2.5 (3) MG/3ML Soln Commonly known as: DUONEB Inhale into the lungs.   magnesium oxide 400 (240 Mg) MG tablet Commonly known as: MAG-OX Take 1 tablet (400 mg total) by mouth 2 (two) times daily.   metFORMIN 1000 MG tablet Commonly known as: GLUCOPHAGE Take 1,000 mg by mouth 2 (two) times daily.   omeprazole 20 MG capsule Commonly known as: PRILOSEC Take 20 mg by mouth daily.   One Daily tablet Take 1 tablet by mouth daily.   polyethylene glycol 17 g packet Commonly known as: MIRALAX / GLYCOLAX Take 17 g by mouth daily as needed for mild constipation.  potassium chloride SA 20 MEQ tablet Commonly known as: KLOR-CON Take 20 mEq by mouth daily.   rivaroxaban 20 MG Tabs tablet Commonly known as: XARELTO Take 1 tablet (20 mg total) by mouth daily with supper. What changed:  how much to take when to take this   senna-docusate 8.6-50 MG tablet Commonly known as: Senokot-S Senna with Docusate Sodium 8.6 mg-50 mg tablet  Take 2 tablets twice a day by oral route.   Spiriva HandiHaler 18 MCG inhalation capsule Generic drug: tiotropium Spiriva with HandiHaler 18 mcg and inhalation capsules   tamsulosin 0.4 MG Caps capsule Commonly known as: FLOMAX Take 0.4 mg by mouth daily.   TrueTrack Test test strip Generic drug: glucose blood        Contact information for follow-up providers     Corey Skains, MD Follow up in 1 week(s).   Specialty: Cardiology Contact information: [facility] [place] [address] [phone]         Danae Orleans, MD. Schedule an appointment as soon as possible for a visit in 1 week(s).   Specialty: Internal Medicine Contact information: [address] [facility] [place] [address] [phone]         Hollice Espy, MD. Schedule an appointment as soon as  possible for a visit in 1 week(s).   Specialty: Urology Why: Make appointment with anyone in that practice, patient is being discharged on Foley catheter due to urinary retention. Contact information: [address] [place] [address] [phone]              Contact information for after-discharge care     Destination     [facility] Preferred SNF .   Service: Skilled Nursing Contact information: [place] [address] [phone]                    Allergies  Allergen Reactions   Penicillins Rash and Swelling    Break outs swelling Other reaction(s): SWELLING in 1955 Break outs swelling Other reaction(s): SWELLING in 1955 Break outs swelling Other reaction(s): SWELLING in 1955 Break outs swelling Break outs swelling Other reaction(s): SWELLING in 1955 Break outs swelling Other reaction(s): SWELLING in 1955 Break outs swelling Break outs swelling Other reaction(s): SWELLING in 1955 Break outs swelling Other reaction(s): SWELLING in 1955 Break outs swelling Other reaction(s): SWELLING in 1955 Break outs swelling Break outs swelling Other reaction(s): SWELLING in 1955  Break outs swelling Other reaction(s): SWELLING in 1955 Break outs swelling Other reaction(s): SWELLING in 1955 Break outs swelling Other reaction(s): SWELLING in 1955 Break outs swelling Break outs swelling Other reaction(s): SWELLING in 1955 Break outs swelling Other reaction(s): SWELLING in 1955 Break outs swelling Break outs swelling Other reaction(s): SWELLING in 1955    Apixaban Other (See Comments)    Cold intolerance Cold intolerance    Prednisone Other (See Comments) and Rash    Makes mean  Significant mood and behavioral changes - 'felt like I wasn't in control of anything' Makes mean  Significant mood and behavioral changes - 'felt like I wasn't in control of anything' Makes mean   Significant mood and behavioral changes - 'felt like I wasn't in control of anything' Significant mood and behavioral changes - 'felt like I wasn't in control of anything' Makes mean  Significant mood and behavioral changes - 'felt like I wasn't in control of anything' Makes mean  Significant mood and behavioral  changes - 'felt like I wasn't in control of anything' Makes mean  Significant mood and behavioral changes - 'felt like I wasn't in control of anything' Significant mood and behavioral changes - 'felt like I wasn't in control of anything' Makes mean  Makes mean  Significant mood and behavioral changes - 'felt like I wasn't in control of anything' Makes mean  Significant mood and behavioral changes - 'felt like I wasn't in control of anything' Makes mean  Significant mood and behavioral changes - 'felt like I wasn't in control of anything' Significant mood and behavioral changes - 'felt like I wasn't in control of anything' Makes mean  Significant mood and behavioral changes - 'felt like I wasn't in control of anything' Makes mean  Significant mood and behavioral changes - 'felt like I wasn't in control of anything'  Makes mean  Significant mood and behavioral changes - 'felt like I wasn't in control of anything' Makes mean  Significant mood and behavioral changes - 'felt like I wasn't in control of anything' Makes mean  Significant mood and behavioral changes - 'felt like I wasn't in control of anything' Significant mood and behavioral changes - 'felt like I wasn't in control of anything' Makes mean  Significant mood and behavioral changes - 'felt like I wasn't in control of anything' Makes mean  Significant mood and behavioral changes - 'felt like I wasn't in control of anything' Makes mean  Significant mood and behavioral changes - 'felt like I wasn't in control of anything' Significant mood and behavioral changes - 'felt like I wasn't in control of anything' Makes mean   Makes mean  Significant mood and behavioral changes - 'felt like I wasn't in control of anything'     Consultations: Cardiology  Procedures/Studies: DG Chest 1 View  Result Date: 02/10/2021 CLINICAL DATA:  Pneumonia, history of CHF EXAM: CHEST  1 VIEW COMPARISON:  October 27 FINDINGS: The heart measures at the upper limit of normal. There is increased right lower lobe consolidation. A small right pleural effusion cannot be excluded. No left pleural effusion. No pneumothorax. No addition to the increased consolidation the right lung, there is now new consolidation the mid lung. No acute osseous abnormality. IMPRESSION: Worsening right lower lobe consolidation with new left mid lung consolidation concerning for multifocal pneumonia. Electronically Signed   By: Albin Felling M.D.   On: 02/10/2021 10:10   CT Head Wo Contrast  Result Date: 02/06/2021 CLINICAL DATA:  Fall with weakness EXAM: CT HEAD WITHOUT CONTRAST TECHNIQUE: Contiguous axial images were obtained from the base of the skull through the vertex without intravenous contrast. COMPARISON:  CT brain 12/01/2013 FINDINGS: Brain: No acute territorial infarction, hemorrhage, or intracranial mass. Moderate atrophy. Moderate chronic small vessel ischemic changes of the white matter. Mildly prominent ventricles felt secondary to atrophy. Vascular: No hyperdense vessels. Vertebral and carotid vascular calcification Skull: Normal. Negative for fracture or focal lesion. Sinuses/Orbits: No acute finding. Other: None IMPRESSION: 1. No CT evidence for acute intracranial abnormality. 2. Atrophy and chronic small vessel ischemic changes of the white matter Electronically Signed   By: Donavan Foil M.D.   On: 02/06/2021 18:26   DG Chest Portable 1 View  Result Date: 02/06/2021 CLINICAL DATA:  Weakness EXAM: PORTABLE CHEST 1 VIEW COMPARISON:  12/02/2013 FINDINGS: Consolidation in the right lower lobe compatible with pneumonia. Heart is normal size. Left  lung clear. No effusions or acute bony abnormality. IMPRESSION: Right lower lobe pneumonia. Followup PA and lateral chest X-ray is recommended  in 3-4 weeks following trial of antibiotic therapy to ensure resolution and exclude underlying malignancy. Electronically Signed   By: Rolm Baptise M.D.   On: 02/06/2021 17:53   ECHOCARDIOGRAM COMPLETE  Result Date: 02/08/2021    ECHOCARDIOGRAM REPORT   Patient Name:   JOHNANTHONY WILDEN Date of Exam: 02/07/2021 Medical Rec #:  [id number]    Height:       67.0 in Accession #:    [id number]   Weight:       180.0 lb Date of Birth:  [birth year]    BSA:          1.934 m Patient Age:    85 years     BP:           104/69 mmHg Patient Gender: M            HR:           63 bpm. Exam Location:  [facility] Procedure: 2D Echo Indications:     Atrial Fibrillation I48.91  History:         Patient has no prior history of Echocardiogram examinations.                  Risk Factors:Hypertension, Diabetes and Dyslipidemia.  Sonographer:     Avanell Shackleton Referring Phys:  [id number] L PATEL Diagnosing Phys: Serafina Royals MD  Sonographer Comments: Technically difficult study due to poor echo windows, suboptimal parasternal window and no apical window. Image acquisition challenging due to patient body habitus. IMPRESSIONS  1. Left ventricular ejection fraction, by estimation, is 55 to 60%. The left ventricle has normal function. The left ventricle has no regional wall motion abnormalities. Left ventricular diastolic function could not be evaluated.  2. Right ventricular systolic function is normal. The right ventricular size is normal.  3. Left atrial size was mildly dilated.  4. The mitral valve is normal in structure. Mild mitral valve regurgitation.  5. The aortic valve is grossly normal. Aortic valve regurgitation is not visualized. FINDINGS  Left Ventricle: Left ventricular ejection fraction, by estimation, is 55 to 60%. The left ventricle has normal function. The left ventricle has no regional  wall motion abnormalities. The left ventricular internal cavity size was normal in size. There is  no left ventricular hypertrophy. Left ventricular diastolic function could not be evaluated. Right Ventricle: The right ventricular size is normal. No increase in right ventricular wall thickness. Right ventricular systolic function is normal. Left Atrium: Left atrial size was mildly dilated. Right Atrium: Right atrial size was normal in size. Pericardium: There is no evidence of pericardial effusion. Mitral Valve: The mitral valve is normal in structure. Mild mitral valve regurgitation. Tricuspid Valve: The tricuspid valve is normal in structure. Tricuspid valve regurgitation is trivial. Aortic Valve: The aortic valve is grossly normal. Aortic valve regurgitation is not visualized. Pulmonic Valve: The pulmonic valve was not assessed. Pulmonic valve regurgitation is not visualized. Aorta: The aortic root and ascending aorta are structurally normal, with no evidence of dilitation. IAS/Shunts: The interatrial septum was not assessed.  LEFT VENTRICLE PLAX 2D LVIDd:         4.70 cm LVIDs:         3.30 cm LV PW:         1.20 cm LV IVS:        0.70 cm  LEFT ATRIUM         Index LA diam:    3.70 cm 1.91 cm/m  TRICUSPID VALVE TR Peak  grad:   30.5 mmHg TR Vmax:        276.00 cm/s Serafina Royals MD Electronically signed by Serafina Royals MD Signature Date/Time: 02/08/2021/7:10:44 AM    Final    US THYROID  Result Date: 02/07/2021 CLINICAL DATA:  Elevated serum free T4 level EXAM: THYROID ULTRASOUND TECHNIQUE: Ultrasound examination of the thyroid gland and adjacent soft tissues was performed. COMPARISON:  None. FINDINGS: Parenchymal Echotexture: Moderately heterogenous Isthmus: 0.2 cm thickness Right lobe: 4.2 x 1.8 x 1.8 cm Left lobe: 3.5 x 1.5 x 1.2 cm _________________________________________________________ Estimated total number of nodules >/= 1 cm: 1 Number of spongiform nodules >/=  2 cm not described below (TR1): 0  Number of mixed cystic and solid nodules >/= 1.5 cm not described below (TR2): 0 _________________________________________________________ Nodule 1: 0.9 cm benign septated cyst, mid right Nodule # 2: Location: Right; inferior Maximum size: 1.1 cm; Other 2 dimensions: 1.1 x 0.7 cm Composition: solid/almost completely solid (2) Echogenicity: hypoechoic (2) Shape: not taller-than-wide (0) Margins: smooth (0) Echogenic foci: macrocalcifications (1) ACR TI-RADS total points: 5. ACR TI-RADS risk category: TR4 (4-6 points). ACR TI-RADS recommendations: *Given size (>/= 1 - 1.4 cm) and appearance, a follow-up ultrasound in 1 year should be considered based on TI-RADS criteria. _________________________________________________________ Nodule 3: 0.7 cm complex cyst, mid left; This nodule does NOT meet TI-RADS criteria for biopsy or dedicated follow-up. No regional cervical adenopathy identified. IMPRESSION: 1. Normal-sized thyroid with bilateral nodules as above. None meets criteria for biopsy. 2. Recommend annual/biennial ultrasound follow-up of inferior right nodule as above, until stability x5 years confirmed. The above is in keeping with the ACR TI-RADS recommendations - J Am Coll Radiol 2017;14:587-595. Electronically Signed   By: Lucrezia Europe M.D.   On: 02/07/2021 12:33    Subjective: Patient was seen and examined today.  No new complaints.  Discharge Exam: Vitals:   02/14/21 0825 02/14/21 1139  BP: (!) 142/65 140/72  Pulse: 89 80  Resp: 16 17  Temp: 98.8 F (37.1 C) 98.4 F (36.9 C)  SpO2: 92% 94%   Vitals:   02/13/21 2325 02/14/21 0309 02/14/21 0825 02/14/21 1139  BP: 140/74 (!) 142/68 (!) 142/65 140/72  Pulse: 81 84 89 80  Resp: _0 Temp: 97.7 F (36.5 C) 98 F (36.7 C) 98.8 F (37.1 C) 98.4 F (36.9 C)  TempSrc: Oral Oral  Oral  SpO2: 95% 93% 92% 94%  Weight:      Height:        General: Pt is alert, awake, not in acute distress Cardiovascular: RRR, S1/S2 +, no rubs, no  gallops Respiratory: CTA bilaterally, no wheezing, no rhonchi Abdominal: Soft, NT, ND, bowel sounds + Extremities: no edema, no cyanosis   The results of significant diagnostics from this hospitalization (including imaging, microbiology, ancillary and laboratory) are listed below for reference.    Microbiology: Recent Results (from the past 240 hour(s))  Blood culture (routine x 2)     Status: None   Collection Time: 02/06/21  6:58 PM   Specimen: BLOOD  Result Value Ref Range Status   Specimen Description BLOOD BLOOD LEFT HAND  Final   Special Requests   Final    BOTTLES DRAWN AEROBIC AND ANAEROBIC Blood Culture results may not be optimal due to an inadequate volume of blood received in culture bottles   Culture   Final    NO GROWTH 5 DAYS Performed at [facility], [address], [place], [place] [address]  Report Status 02/11/2021 FINAL  Final  Blood culture (routine x 2)     Status: None   Collection Time: 02/06/21  7:03 PM   Specimen: BLOOD  Result Value Ref Range Status   Specimen Description BLOOD RIGHT ANTECUBITAL  Final   Special Requests   Final    BOTTLES DRAWN AEROBIC AND ANAEROBIC Blood Culture results may not be optimal due to an inadequate volume of blood received in culture bottles   Culture   Final    NO GROWTH 5 DAYS Performed at [facility], [address] [place], [place] [address]    Report Status 02/11/2021 FINAL  Final  Resp Panel by RT-PCR (Flu A&B, Covid) Nasopharyngeal Swab     Status: None   Collection Time: 02/06/21 10:06 PM   Specimen: Nasopharyngeal Swab; Nasopharyngeal(NP) swabs in vial transport medium  Result Value Ref Range Status   SARS Coronavirus 2 by RT PCR NEGATIVE NEGATIVE Final    Comment: (NOTE) SARS-CoV-2 target nucleic acids are NOT DETECTED.  The SARS-CoV-2 RNA is generally detectable in upper respiratory specimens during the acute phase of infection. The lowest concentration of SARS-CoV-2 viral copies  this assay can detect is 138 copies/mL. A negative result does not preclude SARS-Cov-2 infection and should not be used as the sole basis for treatment or other patient management decisions. A negative result may occur with  improper specimen collection/handling, submission of specimen other than nasopharyngeal swab, presence of viral mutation(s) within the areas targeted by this assay, and inadequate number of viral copies(<138 copies/mL). A negative result must be combined with clinical observations, patient history, and epidemiological information. The expected result is Negative.  Fact Sheet for Patients:  EntrepreneurPulse.com.au  Fact Sheet for Healthcare Providers:  IncredibleEmployment.be  This test is no t yet approved or cleared by the [place] FDA and  has been authorized for detection and/or diagnosis of SARS-CoV-2 by FDA under an Emergency Use Authorization (EUA). This EUA will remain  in effect (meaning this test can be used) for the duration of the COVID-19 declaration under Section 564(b)(1) of the Act, 21 U.S.C.section 360bbb-3(b)(1), unless the authorization is terminated  or revoked sooner.       Influenza A by PCR NEGATIVE NEGATIVE Final   Influenza B by PCR NEGATIVE NEGATIVE Final    Comment: (NOTE) The Xpert Xpress SARS-CoV-2/FLU/RSV plus assay is intended as an aid in the diagnosis of influenza from Nasopharyngeal swab specimens and should not be used as a sole basis for treatment. Nasal washings and aspirates are unacceptable for Xpert Xpress SARS-CoV-2/FLU/RSV testing.  Fact Sheet for Patients: EntrepreneurPulse.com.au  Fact Sheet for Healthcare Providers: IncredibleEmployment.be  This test is not yet approved or cleared by the [place] FDA and has been authorized for detection and/or diagnosis of SARS-CoV-2 by FDA under an Emergency Use Authorization (EUA). This EUA  will remain in effect (meaning this test can be used) for the duration of the COVID-19 declaration under Section 564(b)(1) of the Act, 21 U.S.C. section 360bbb-3(b)(1), unless the authorization is terminated or revoked.  Performed at [facility], [place], [place] [address]   SARS CORONAVIRUS 2 (TAT 6-24 HRS) Nasopharyngeal Nasopharyngeal Swab     Status: None   Collection Time: 02/10/21  1:24 PM   Specimen: Nasopharyngeal Swab  Result Value Ref Range Status   SARS Coronavirus 2 NEGATIVE NEGATIVE Final    Comment: (NOTE) SARS-CoV-2 target nucleic acids are NOT DETECTED.  The SARS-CoV-2 RNA is generally detectable in  upper and lower respiratory specimens during the acute phase of infection. Negative results do not preclude SARS-CoV-2 infection, do not rule out co-infections with other pathogens, and should not be used as the sole basis for treatment or other patient management decisions. Negative results must be combined with clinical observations, patient history, and epidemiological information. The expected result is Negative.  Fact Sheet for Patients: SugarRoll.be  Fact Sheet for Healthcare Providers: https://www.woods-mathews.com/  This test is not yet approved or cleared by the [place] FDA and  has been authorized for detection and/or diagnosis of SARS-CoV-2 by FDA under an Emergency Use Authorization (EUA). This EUA will remain  in effect (meaning this test can be used) for the duration of the COVID-19 declaration under Se ction 564(b)(1) of the Act, 21 U.S.C. section 360bbb-3(b)(1), unless the authorization is terminated or revoked sooner.  Performed at [facility], [place] [address], [place], [place] [address]   SARS CORONAVIRUS 2 (TAT 6-24 HRS) Nasopharyngeal Nasopharyngeal Swab     Status: None   Collection Time: 02/12/21  1:08 PM   Specimen: Nasopharyngeal Swab  Result Value Ref Range  Status   SARS Coronavirus 2 NEGATIVE NEGATIVE Final    Comment: (NOTE) SARS-CoV-2 target nucleic acids are NOT DETECTED.  The SARS-CoV-2 RNA is generally detectable in upper and lower respiratory specimens during the acute phase of infection. Negative results do not preclude SARS-CoV-2 infection, do not rule out co-infections with other pathogens, and should not be used as the sole basis for treatment or other patient management decisions. Negative results must be combined with clinical observations, patient history, and epidemiological information. The expected result is Negative.  Fact Sheet for Patients: SugarRoll.be  Fact Sheet for Healthcare Providers: https://www.woods-mathews.com/  This test is not yet approved or cleared by the [place] FDA and  has been authorized for detection and/or diagnosis of SARS-CoV-2 by FDA under an Emergency Use Authorization (EUA). This EUA will remain  in effect (meaning this test can be used) for the duration of the COVID-19 declaration under Se ction 564(b)(1) of the Act, 21 U.S.C. section 360bbb-3(b)(1), unless the authorization is terminated or revoked sooner.  Performed at [facility], [place] [address] [place], [place] [address]      Labs: BNP (last 3 results) No results for input(s): BNP in the last 8760 hours. Basic Metabolic Panel: Recent Labs  Lab 02/07/21 1553 02/08/21 0437 02/08/21 0437 02/09/21 0456 02/10/21 0600 02/11/21 0516 02/12/21 0528 02/13/21 0819 02/14/21 0452  NA  --  138   < > 138 136 137 135 140  --   K  --  4.7   < > 3.8 4.6 4.3 4.3 4.0  --   CL  --  106   < > 103 103 106 105 102  --   CO2  --  28   < > _0 21* 30  --   GLUCOSE  --  265*   < > 288* 302* 281* 324* 171*  --   BUN  --  49*   < > 40* 28* _1 --   CREATININE  --  1.45*   < > 1.14 1.00 0.73 0.87 0.92  --   CALCIUM  --  8.3*   < > 8.5* 8.5* 8.6* 8.5* 8.6*  --   MG  --  2.0  --   1.9 1.8  --  1.9  --  2.1  PHOS 3.1 2.3*  --  2.9  3.0  --   --   --   --    < > = values in this interval not displayed.   Liver Function Tests: No results for input(s): AST, ALT, ALKPHOS, BILITOT, PROT, ALBUMIN in the last 168 hours. No results for input(s): LIPASE, AMYLASE in the last 168 hours. No results for input(s): AMMONIA in the last 168 hours. CBC: Recent Labs  Lab 02/08/21 0437 02/09/21 0456 02/10/21 0600 02/11/21 0516 02/12/21 0528  WBC 10.3 8.5 8.6 9.7 9.9  HGB 13.0 12.8* 12.6* 12.1* 12.1*  HCT 38.9* 38.7* 38.3* 38.6* 39.6  MCV 94.9 92.4 92.7 95.8 100.3*  PLT 171 197 221 200 251   Cardiac Enzymes: No results for input(s): CKTOTAL, CKMB, CKMBINDEX, TROPONINI in the last 168 hours.  BNP: Invalid input(s): POCBNP CBG: Recent Labs  Lab 02/13/21 0722 02/13/21 1142 02/13/21 1526 02/13/21 2012 02/14/21 0826  GLUCAP 181* 252* 166* 273* 175*   D-Dimer No results for input(s): DDIMER in the last 72 hours. Hgb A1c No results for input(s): HGBA1C in the last 72 hours. Lipid Profile No results for input(s): CHOL, HDL, LDLCALC, TRIG, CHOLHDL, LDLDIRECT in the last 72 hours. Thyroid function studies No results for input(s): TSH, T4TOTAL, T3FREE, THYROIDAB in the last 72 hours.  Invalid input(s): FREET3 Anemia work up No results for input(s): VITAMINB12, FOLATE, FERRITIN, TIBC, IRON, RETICCTPCT in the last 72 hours. Urinalysis    Component Value Date/Time   COLORURINE YELLOW 12/01/2013 2020   APPEARANCEUR CLEAR 12/01/2013 2020   LABSPEC 1.029 12/01/2013 2020   PHURINE 5.5 12/01/2013 2020   GLUCOSEU NEGATIVE 12/01/2013 2020   HGBUR NEGATIVE 12/01/2013 2020   BILIRUBINUR SMALL (A) 12/01/2013 2020   KETONESUR 15 (A) 12/01/2013 2020   PROTEINUR 30 (A) 12/01/2013 2020   UROBILINOGEN 0.2 12/01/2013 2020   NITRITE NEGATIVE 12/01/2013 2020   LEUKOCYTESUR NEGATIVE 12/01/2013 2020   Sepsis Labs Invalid input(s): PROCALCITONIN,  WBC,   LACTICIDVEN Microbiology Recent Results (from the past 240 hour(s))  Blood culture (routine x 2)     Status: None   Collection Time: 02/06/21  6:58 PM   Specimen: BLOOD  Result Value Ref Range Status   Specimen Description BLOOD BLOOD LEFT HAND  Final   Special Requests   Final    BOTTLES DRAWN AEROBIC AND ANAEROBIC Blood Culture results may not be optimal due to an inadequate volume of blood received in culture bottles   Culture   Final    NO GROWTH 5 DAYS Performed at [facility], [address] [place], [place] [address]    Report Status 02/11/2021 FINAL  Final  Blood culture (routine x 2)     Status: None   Collection Time: 02/06/21  7:03 PM   Specimen: BLOOD  Result Value Ref Range Status   Specimen Description BLOOD RIGHT ANTECUBITAL  Final   Special Requests   Final    BOTTLES DRAWN AEROBIC AND ANAEROBIC Blood Culture results may not be optimal due to an inadequate volume of blood received in culture bottles   Culture   Final    NO GROWTH 5 DAYS Performed at [facility], [address] [place], [place] [address]    Report Status 02/11/2021 FINAL  Final  Resp Panel by RT-PCR (Flu A&B, Covid) Nasopharyngeal Swab     Status: None   Collection Time: 02/06/21 10:06 PM   Specimen: Nasopharyngeal Swab; Nasopharyngeal(NP) swabs in vial transport medium  Result Value Ref Range Status   SARS Coronavirus 2 by  RT PCR NEGATIVE NEGATIVE Final    Comment: (NOTE) SARS-CoV-2 target nucleic acids are NOT DETECTED.  The SARS-CoV-2 RNA is generally detectable in upper respiratory specimens during the acute phase of infection. The lowest concentration of SARS-CoV-2 viral copies this assay can detect is 138 copies/mL. A negative result does not preclude SARS-Cov-2 infection and should not be used as the sole basis for treatment or other patient management decisions. A negative result may occur with  improper specimen collection/handling, submission of specimen  other than nasopharyngeal swab, presence of viral mutation(s) within the areas targeted by this assay, and inadequate number of viral copies(<138 copies/mL). A negative result must be combined with clinical observations, patient history, and epidemiological information. The expected result is Negative.  Fact Sheet for Patients:  EntrepreneurPulse.com.au  Fact Sheet for Healthcare Providers:  IncredibleEmployment.be  This test is no t yet approved or cleared by the [place] FDA and  has been authorized for detection and/or diagnosis of SARS-CoV-2 by FDA under an Emergency Use Authorization (EUA). This EUA will remain  in effect (meaning this test can be used) for the duration of the COVID-19 declaration under Section 564(b)(1) of the Act, 21 U.S.C.section 360bbb-3(b)(1), unless the authorization is terminated  or revoked sooner.       Influenza A by PCR NEGATIVE NEGATIVE Final   Influenza B by PCR NEGATIVE NEGATIVE Final    Comment: (NOTE) The Xpert Xpress SARS-CoV-2/FLU/RSV plus assay is intended as an aid in the diagnosis of influenza from Nasopharyngeal swab specimens and should not be used as a sole basis for treatment. Nasal washings and aspirates are unacceptable for Xpert Xpress SARS-CoV-2/FLU/RSV testing.  Fact Sheet for Patients: EntrepreneurPulse.com.au  Fact Sheet for Healthcare Providers: IncredibleEmployment.be  This test is not yet approved or cleared by the [place] FDA and has been authorized for detection and/or diagnosis of SARS-CoV-2 by FDA under an Emergency Use Authorization (EUA). This EUA will remain in effect (meaning this test can be used) for the duration of the COVID-19 declaration under Section 564(b)(1) of the Act, 21 U.S.C. section 360bbb-3(b)(1), unless the authorization is terminated or revoked.  Performed at [facility], [place], [place] [address]   SARS CORONAVIRUS 2 (TAT 6-24 HRS) Nasopharyngeal Nasopharyngeal Swab     Status: None   Collection Time: 02/10/21  1:24 PM   Specimen: Nasopharyngeal Swab  Result Value Ref Range Status   SARS Coronavirus 2 NEGATIVE NEGATIVE Final    Comment: (NOTE) SARS-CoV-2 target nucleic acids are NOT DETECTED.  The SARS-CoV-2 RNA is generally detectable in upper and lower respiratory specimens during the acute phase of infection. Negative results do not preclude SARS-CoV-2 infection, do not rule out co-infections with other pathogens, and should not be used as the sole basis for treatment or other patient management decisions. Negative results must be combined with clinical observations, patient history, and epidemiological information. The expected result is Negative.  Fact Sheet for Patients: SugarRoll.be  Fact Sheet for Healthcare Providers: https://www.woods-mathews.com/  This test is not yet approved or cleared by the [place] FDA and  has been authorized for detection and/or diagnosis of SARS-CoV-2 by FDA under an Emergency Use Authorization (EUA). This EUA will remain  in effect (meaning this test can be used) for the duration of the COVID-19 declaration under Se ction 564(b)(1) of the Act, 21 U.S.C. section 360bbb-3(b)(1), unless the authorization is terminated or revoked sooner.  Performed at [facility], [place] [address], [place], [place]  [address]   SARS CORONAVIRUS 2 (TAT 6-24 HRS) Nasopharyngeal Nasopharyngeal Swab     Status: None   Collection Time: 02/12/21  1:08 PM   Specimen: Nasopharyngeal Swab  Result Value Ref Range Status   SARS Coronavirus 2 NEGATIVE NEGATIVE Final    Comment: (NOTE) SARS-CoV-2 target nucleic acids are NOT DETECTED.  The SARS-CoV-2 RNA is generally detectable in upper and lower respiratory specimens during the acute phase of infection. Negative results do not preclude  SARS-CoV-2 infection, do not rule out co-infections with other pathogens, and should not be used as the sole basis for treatment or other patient management decisions. Negative results must be combined with clinical observations, patient history, and epidemiological information. The expected result is Negative.  Fact Sheet for Patients: SugarRoll.be  Fact Sheet for Healthcare Providers: https://www.woods-mathews.com/  This test is not yet approved or cleared by the [place] FDA and  has been authorized for detection and/or diagnosis of SARS-CoV-2 by FDA under an Emergency Use Authorization (EUA). This EUA will remain  in effect (meaning this test can be used) for the duration of the COVID-19 declaration under Se ction 564(b)(1) of the Act, 21 U.S.C. section 360bbb-3(b)(1), unless the authorization is terminated or revoked sooner.  Performed at [facility], [place] [address], [place], [place] [address]     Time coordinating discharge: Over 30 minutes  SIGNED:  Lorella Nimrod, MD  [facility] 02/14/2021, 11:42 AM  If 7PM-7AM, please contact night-coverage [email or web address]  This record has been created using Systems analyst. Errors have been sought and corrected,but may not always be located. Such creation errors do not reflect on the standard of care.

## 2021-02-13 NOTE — Progress Notes (Signed)
Patient has not been able to pee. Bladder scan performed at 1330 shows 782 ml. Patient moved to the bed side commode and only 15 ml was voided. Will discuss with physician regarding an in and out, and about possibility of having a foley in place before discharge

## 2021-02-13 NOTE — Care Management Important Message (Signed)
Important Message  Patient Details  Name: Samuel Thomas MRN: [id number] Date of Birth: April 02, 1936   Medicare Important Message Given:  Yes     [name] 02/13/2021, 3:07 PM

## 2021-02-13 NOTE — TOC Transition Note (Addendum)
Transition of Care [facility]) - CM/SW Discharge Note   Patient Details  Name: HANSEL DEVAN MRN: [id number] Date of Birth: 1935/10/31  [facility]) CM/SW Contact:  Maree Krabbe, LCSW Phone Number: 02/13/2021, 12:52 PM   Clinical Narrative:   Clinical Social Worker facilitated patient discharge including contacting patient family and facility to confirm patient discharge plans.  Clinical information faxed to facility and family agreeable with plan.  CSW arranged ambulance transport via [facility] to [facility] .  RN to call [phone] for report prior to discharge.     Final next level of care: Skilled Nursing Facility Barriers to Discharge: No Barriers Identified   Patient Goals and CMS Choice Patient states their goals for this hospitalization and ongoing recovery are:: to return home CMS Medicare.gov Compare Post Acute Care list provided to:: Patient Choice offered to / list presented to : Patient  Discharge Placement              Patient chooses bed at:  ([facility]) Patient to be transferred to facility by: [facility] Name of family member notified: son Patient and family notified of of transfer: 02/13/21  Discharge Plan and Services                                     Social Determinants of Health (SDOH) Interventions     Readmission Risk Interventions Readmission Risk Prevention Plan 02/08/2021  Transportation Screening Complete  Medication Review Oceanographer) Complete  PCP or Specialist appointment within 3-5 days of discharge Complete  HRI or Home Care Consult Complete  SW Recovery Care/Counseling Consult Complete  Palliative Care Screening Not Applicable  Skilled Nursing Facility Complete  Some recent data might be hidden

## 2021-02-13 NOTE — TOC Progression Note (Signed)
Transition of Care [facility]) - Progression Note    Patient Details  Name: Samuel Thomas MRN: [id number] Date of Birth: 1935/05/03  Transition of Care [facility]) CM/SW Contact  Maree Krabbe, LCSW Phone Number: 02/13/2021, 10:30 AM  Clinical Narrative:  Auth obtained, Covid is negative. CSW will need dc summary prior to dc.     Expected Discharge Plan: Home/Self Care Barriers to Discharge: Continued Medical Work up  Expected Discharge Plan and Services Expected Discharge Plan: Home/Self Care       Living arrangements for the past 2 months: Single Family Home                                       Social Determinants of Health (SDOH) Interventions    Readmission Risk Interventions Readmission Risk Prevention Plan 02/08/2021  Transportation Screening Complete  Medication Review Oceanographer) Complete  PCP or Specialist appointment within 3-5 days of discharge Complete  HRI or Home Care Consult Complete  SW Recovery Care/Counseling Consult Complete  Palliative Care Screening Not Applicable  Skilled Nursing Facility Complete  Some recent data might be hidden

## 2021-02-13 NOTE — TOC Progression Note (Signed)
Transition of Care [facility]) - Progression Note    Patient Details  Name: Samuel Thomas MRN: [id number] Date of Birth: 06-27-1935  [facility]) CM/SW Contact  Maree Krabbe, LCSW Phone Number: 02/13/2021, 2:55 PM  Clinical Narrative:  CSW received a call from SNF that pt can't dc to facility today. MD RN and [facility] Director notified. Plan is for pt to dc tomorrow.     Expected Discharge Plan: Home/Self Care Barriers to Discharge: No Barriers Identified  Expected Discharge Plan and Services Expected Discharge Plan: Home/Self Care       Living arrangements for the past 2 months: Single Family Home Expected Discharge Date: 02/13/21                                     Social Determinants of Health (SDOH) Interventions    Readmission Risk Interventions Readmission Risk Prevention Plan 02/08/2021  Transportation Screening Complete  Medication Review Oceanographer) Complete  PCP or Specialist appointment within 3-5 days of discharge Complete  HRI or Home Care Consult Complete  SW Recovery Care/Counseling Consult Complete  Palliative Care Screening Not Applicable  Skilled Nursing Facility Complete  Some recent data might be hidden

## 2021-02-13 NOTE — Progress Notes (Signed)
[facility] Encounter Note  Patient: Samuel Thomas / Admit Date: 02/06/2021 / Date of Encounter: 02/13/2021, 12:35 PM   Subjective: Patient has been doing fairly well now in the last 24 hours with increase pulmonary toilet as well as now spontaneously converted to normal sinus rhythm with no evidence of rapid rate.  No evidence of chest pain or congestive heart failure type symptoms at this time  Review of Systems: Positive for: Shortness of breath Negative for: Vision change, hearing change, syncope, dizziness, nausea, vomiting,diarrhea, bloody stool, stomach pain, cough, congestion, diaphoresis, urinary frequency, urinary pain,skin lesions, skin rashes Others previously listed  Objective:  Telemetry: Normal sinus rhythm Physical Exam: Blood pressure (!) 148/69, pulse 93, temperature 97.9 F (36.6 C), resp. rate 20, height 5\' 7"  (1.702 m), weight 81.6 kg, SpO2 97 %. Body mass index is 28.19 kg/m. General: Well developed, well nourished, in no acute distress. Head: Normocephalic, atraumatic, sclera non-icteric, no xanthomas, nares are without discharge. Neck: No apparent masses Lungs: Normal respirations with no wheezes, diffuse rhonchi, no rales , no crackles   Heart: Irregular rate and rhythm, normal S1 S2, no murmur, no rub, no gallop, PMI is normal size and placement, carotid upstroke normal without bruit, jugular venous pressure normal Abdomen: Soft, non-tender, non-distended with normoactive bowel sounds. No hepatosplenomegaly. Abdominal aorta is normal size without bruit Extremities: No edema, no clubbing, no cyanosis, no ulcers,  Peripheral: 2+ radial, 2+ femoral, 2+ dorsal pedal pulses Neuro: Alert and oriented. Moves all extremities spontaneously. Psych:  Responds to questions appropriately with a normal affect.   Intake/Output Summary (Last 24 hours) at 02/13/2021 1235 Last data filed at 02/13/2021 0950 Gross per 24 hour  Intake 240 ml  Output 1900 ml   Net -1660 ml     Inpatient Medications:  . vitamin C  500 mg Oral Daily  . aspirin EC  81 mg Oral Daily  . atorvastatin  20 mg Oral QHS  . budesonide  0.5 mg Nebulization Daily  . diltiazem  240 mg Oral Daily  . dofetilide  125 mcg Oral BID  . feeding supplement (NEPRO CARB STEADY)  237 mL Oral BID BM  . folic acid  1 mg Oral Daily  . gabapentin  200 mg Oral TID  . guaiFENesin  600 mg Oral BID  . insulin aspart  0-15 Units Subcutaneous TID WC  . insulin aspart  3 Units Subcutaneous TID WC  . insulin glargine-yfgn  15 Units Subcutaneous Daily  . iron polysaccharides  150 mg Oral Daily  . levofloxacin  750 mg Oral Daily  . magnesium oxide  400 mg Oral BID  . multivitamin with minerals  1 tablet Oral Daily  . rivaroxaban  20 mg Oral Q supper  . sodium chloride flush  3 mL Intravenous Q12H  . thiamine  100 mg Oral Daily  . tiotropium  18 mcg Inhalation Daily   Infusions:     Labs: Recent Labs    02/12/21 0528 02/13/21 0819  NA 135 140  K 4.3 4.0  CL 105 102  CO2 21* 30  GLUCOSE 324* 171*  BUN 18 16  CREATININE 0.87 0.92  CALCIUM 8.5* 8.6*  MG 1.9  --     No results for input(s): AST, ALT, ALKPHOS, BILITOT, PROT, ALBUMIN in the last 72 hours. Recent Labs    02/11/21 0516 02/12/21 0528  WBC 9.7 9.9  HGB 12.1* 12.1*  HCT 38.6* 39.6  MCV 95.8 100.3*  PLT 200 251  No results for input(s): CKTOTAL, CKMB, TROPONINI in the last 72 hours.  Invalid input(s): POCBNP No results for input(s): HGBA1C in the last 72 hours.    Weights: Filed Weights   02/06/21 1727  Weight: 81.6 kg     Radiology/Studies:  DG Chest 1 View  Result Date: 02/10/2021 CLINICAL DATA:  Pneumonia, history of CHF EXAM: CHEST  1 VIEW COMPARISON:  October 27 FINDINGS: The heart measures at the upper limit of normal. There is increased right lower lobe consolidation. A small right pleural effusion cannot be excluded. No left pleural effusion. No pneumothorax. No addition to the  increased consolidation the right lung, there is now new consolidation the mid lung. No acute osseous abnormality. IMPRESSION: Worsening right lower lobe consolidation with new left mid lung consolidation concerning for multifocal pneumonia. Electronically Signed   By: Olive Bass M.D.   On: 02/10/2021 10:10   CT Head Wo Contrast  Result Date: 02/06/2021 CLINICAL DATA:  Fall with weakness EXAM: CT HEAD WITHOUT CONTRAST TECHNIQUE: Contiguous axial images were obtained from the base of the skull through the vertex without intravenous contrast. COMPARISON:  CT brain 12/01/2013 FINDINGS: Brain: No acute territorial infarction, hemorrhage, or intracranial mass. Moderate atrophy. Moderate chronic small vessel ischemic changes of the white matter. Mildly prominent ventricles felt secondary to atrophy. Vascular: No hyperdense vessels. Vertebral and carotid vascular calcification Skull: Normal. Negative for fracture or focal lesion. Sinuses/Orbits: No acute finding. Other: None IMPRESSION: 1. No CT evidence for acute intracranial abnormality. 2. Atrophy and chronic small vessel ischemic changes of the white matter Electronically Signed   By: Jasmine Pang M.D.   On: 02/06/2021 18:26   DG Chest Portable 1 View  Result Date: 02/06/2021 CLINICAL DATA:  Weakness EXAM: PORTABLE CHEST 1 VIEW COMPARISON:  12/02/2013 FINDINGS: Consolidation in the right lower lobe compatible with pneumonia. Heart is normal size. Left lung clear. No effusions or acute bony abnormality. IMPRESSION: Right lower lobe pneumonia. Followup PA and lateral chest X-ray is recommended in 3-4 weeks following trial of antibiotic therapy to ensure resolution and exclude underlying malignancy. Electronically Signed   By: Charlett Nose M.D.   On: 02/06/2021 17:53   ECHOCARDIOGRAM COMPLETE  Result Date: 02/08/2021    ECHOCARDIOGRAM REPORT   Patient Name:   Samuel Thomas Date of Exam: 02/07/2021 Medical Rec #:  [id number]    Height:       67.0 in  Accession #:    [id number]   Weight:       180.0 lb Date of Birth:  1935/08/31    BSA:          1.934 m Patient Age:    85 years     BP:           104/69 mmHg Patient Gender: M            HR:           63 bpm. Exam Location:  [facility] Procedure: 2D Echo Indications:     Atrial Fibrillation I48.91  History:         Patient has no prior history of Echocardiogram examinations.                  Risk Factors:Hypertension, Diabetes and Dyslipidemia.  Sonographer:     Johnathan Hausen Referring Phys:  [id number] KGYJ E PATEL Diagnosing Phys: Arnoldo Hooker MD  Sonographer Comments: Technically difficult study due to poor echo windows, suboptimal parasternal window and no apical window. Image acquisition challenging  due to patient body habitus. IMPRESSIONS  1. Left ventricular ejection fraction, by estimation, is 55 to 60%. The left ventricle has normal function. The left ventricle has no regional wall motion abnormalities. Left ventricular diastolic function could not be evaluated.  2. Right ventricular systolic function is normal. The right ventricular size is normal.  3. Left atrial size was mildly dilated.  4. The mitral valve is normal in structure. Mild mitral valve regurgitation.  5. The aortic valve is grossly normal. Aortic valve regurgitation is not visualized. FINDINGS  Left Ventricle: Left ventricular ejection fraction, by estimation, is 55 to 60%. The left ventricle has normal function. The left ventricle has no regional wall motion abnormalities. The left ventricular internal cavity size was normal in size. There is  no left ventricular hypertrophy. Left ventricular diastolic function could not be evaluated. Right Ventricle: The right ventricular size is normal. No increase in right ventricular wall thickness. Right ventricular systolic function is normal. Left Atrium: Left atrial size was mildly dilated. Right Atrium: Right atrial size was normal in size. Pericardium: There is no evidence of pericardial effusion.  Mitral Valve: The mitral valve is normal in structure. Mild mitral valve regurgitation. Tricuspid Valve: The tricuspid valve is normal in structure. Tricuspid valve regurgitation is trivial. Aortic Valve: The aortic valve is grossly normal. Aortic valve regurgitation is not visualized. Pulmonic Valve: The pulmonic valve was not assessed. Pulmonic valve regurgitation is not visualized. Aorta: The aortic root and ascending aorta are structurally normal, with no evidence of dilitation. IAS/Shunts: The interatrial septum was not assessed.  LEFT VENTRICLE PLAX 2D LVIDd:         4.70 cm LVIDs:         3.30 cm LV PW:         1.20 cm LV IVS:        0.70 cm  LEFT ATRIUM         Index LA diam:    3.70 cm 1.91 cm/m  TRICUSPID VALVE TR [facility] grad:   30.5 mmHg TR Vmax:        276.00 cm/s Arnoldo Hooker MD Electronically signed by Arnoldo Hooker MD Signature Date/Time: 02/08/2021/7:10:44 AM    Final    US THYROID  Result Date: 02/07/2021 CLINICAL DATA:  Elevated serum free T4 level EXAM: THYROID ULTRASOUND TECHNIQUE: Ultrasound examination of the thyroid gland and adjacent soft tissues was performed. COMPARISON:  None. FINDINGS: Parenchymal Echotexture: Moderately heterogenous Isthmus: 0.2 cm thickness Right lobe: 4.2 x 1.8 x 1.8 cm Left lobe: 3.5 x 1.5 x 1.2 cm _________________________________________________________ Estimated total number of nodules >/= 1 cm: 1 Number of spongiform nodules >/=  2 cm not described below (TR1): 0 Number of mixed cystic and solid nodules >/= 1.5 cm not described below (TR2): 0 _________________________________________________________ Nodule 1: 0.9 cm benign septated cyst, mid right Nodule # 2: Location: Right; inferior Maximum size: 1.1 cm; Other 2 dimensions: 1.1 x 0.7 cm Composition: solid/almost completely solid (2) Echogenicity: hypoechoic (2) Shape: not taller-than-wide (0) Margins: smooth (0) Echogenic foci: macrocalcifications (1) ACR TI-RADS total points: 5. ACR TI-RADS risk  category: TR4 (4-6 points). ACR TI-RADS recommendations: *Given size (>/= 1 - 1.4 cm) and appearance, a follow-up ultrasound in 1 year should be considered based on TI-RADS criteria. _________________________________________________________ Nodule 3: 0.7 cm complex cyst, mid left; This nodule does NOT meet TI-RADS criteria for biopsy or dedicated follow-up. No regional cervical adenopathy identified. IMPRESSION: 1. Normal-sized thyroid with bilateral nodules as above. None meets criteria for biopsy.  2. Recommend annual/biennial ultrasound follow-up of inferior right nodule as above, until stability x5 years confirmed. The above is in keeping with the ACR TI-RADS recommendations - J Am Coll Radiol 2017;14:587-595. Electronically Signed   By: Corlis Leak M.D.   On: 02/07/2021 12:33     Assessment and Recommendation  85 y.o. male with acute paroxysmal nonvalvular atrial fibrillation with rapid ventricular rate due to right lower lobe pneumonia with chronic kidney disease cough and congestion and no current evidence of   acute coronary syndrome although may have some basilar rails causing or contributing to patient's current worsening hypoxia 1.  Continuation of Tikosyn for possible spontaneous conversion to normal rhythm and hopeful maintenance of normal rhythm thereafter but will need continuation of close analysis of magnesium above 2, potassium above 4, and glomerular filtration rate above 30. 2.  Continuation of diltiazem 240 mg CD for heart rate control of atrial fibrillation and maintaining normal sinus rhythm 3.  No further cardiac diagnostics necessary at this time 4.  Continuation of medication management and supportive care of pneumonia 5.  Begin ambulation and follow improvements of symptoms and okay for discharge home from cardiac standpoint with follow-up in 1 to 2 weeks 6.  Anticoagulation for further risk reduction stroke with atrial fibrillation with using Xarelto Signed, Arnoldo Hooker M.D.  FACC

## 2021-02-14 LAB — GLUCOSE, CAPILLARY
Glucose-Capillary: 175 mg/dL — ABNORMAL HIGH (ref 70–99)
Glucose-Capillary: 210 mg/dL — ABNORMAL HIGH (ref 70–99)

## 2021-02-14 LAB — MAGNESIUM: Magnesium: 2.1 mg/dL (ref 1.7–2.4)

## 2021-02-14 NOTE — Progress Notes (Signed)
Tried to call report to [facility] (x2), I was placed on hold for 4 to 5 minutes.  Unable to wait.

## 2021-02-14 NOTE — TOC Transition Note (Signed)
Transition of Care [facility]) - CM/SW Discharge Note   Patient Details  Name: Samuel Thomas MRN: [id number] Date of Birth: 1935/11/29  [facility] CM/SW Contact:  Maree Krabbe, LCSW Phone Number: 02/14/2021, 12:14 PM   Clinical Narrative:   Clinical Social Worker facilitated patient discharge including contacting patient family and facility to confirm patient discharge plans.  Clinical information faxed to facility and family agreeable with plan.  CSW arranged ambulance transport via EMS to [facility] room 810.  RN to call [phone] for report prior to discharge.     Final next level of care: Skilled Nursing Facility Barriers to Discharge: No Barriers Identified   Patient Goals and CMS Choice Patient states their goals for this hospitalization and ongoing recovery are:: to return home CMS Medicare.gov Compare Post Acute Care list provided to:: Patient Choice offered to / list presented to : Patient  Discharge Placement              Patient chooses bed at:  ([facility]) Patient to be transferred to facility by: [facility] Name of family member notified: son Patient and family notified of of transfer: 02/14/21  Discharge Plan and Services                                     Social Determinants of Health (SDOH) Interventions     Readmission Risk Interventions Readmission Risk Prevention Plan 02/08/2021  Transportation Screening Complete  Medication Review [facility]) Complete  PCP or Specialist appointment within 3-5 days of discharge Complete  HRI or Home Care Consult Complete  SW Recovery Care/Counseling Consult Complete  Palliative Care Screening Not Applicable  Skilled Nursing Facility Complete  Some recent data might be hidden

## 2021-02-14 NOTE — Plan of Care (Signed)
  Problem: Clinical Measurements: Goal: Ability to maintain clinical measurements within normal limits will improve Outcome: Progressing   Problem: Clinical Measurements: Goal: Will remain free from infection Outcome: Progressing   Problem: Clinical Measurements: Goal: Diagnostic test results will improve Outcome: Progressing   

## 2021-02-14 NOTE — Progress Notes (Signed)
No charge progress note.  Patient was discharged yesterday.  Apparently skilled nursing facility change the date of admission as room was not ready??  They told us quite late in the day.  Patient will be leaving today as they have cleaned the room now.  Patient was seen and examined today with no new complaints or changes. Is ready to go to SNF.

## 2021-02-20 ENCOUNTER — Inpatient Hospital Stay
Admission: EM | Admit: 2021-02-20 | Discharge: 2021-02-26 | DRG: 871 | Disposition: A | Discharge status: Skilled Nursing Facility | Payer: Medicare Other | Source: Skilled Nursing Facility | Attending: Internal Medicine | Admitting: Internal Medicine

## 2021-02-20 ENCOUNTER — Encounter: Payer: Self-pay | Admitting: Internal Medicine

## 2021-02-20 ENCOUNTER — Emergency Department: Payer: Medicare Other

## 2021-02-20 ENCOUNTER — Other Ambulatory Visit: Payer: Self-pay

## 2021-02-20 DIAGNOSIS — Z794 Long term (current) use of insulin: Secondary | ICD-10-CM

## 2021-02-20 DIAGNOSIS — E042 Nontoxic multinodular goiter: Secondary | ICD-10-CM | POA: Diagnosis present

## 2021-02-20 DIAGNOSIS — E1165 Type 2 diabetes mellitus with hyperglycemia: Secondary | ICD-10-CM | POA: Diagnosis present

## 2021-02-20 DIAGNOSIS — Z888 Allergy status to other drugs, medicaments and biological substances status: Secondary | ICD-10-CM

## 2021-02-20 DIAGNOSIS — Z20822 Contact with and (suspected) exposure to covid-19: Secondary | ICD-10-CM | POA: Diagnosis present

## 2021-02-20 DIAGNOSIS — D638 Anemia in other chronic diseases classified elsewhere: Secondary | ICD-10-CM | POA: Diagnosis present

## 2021-02-20 DIAGNOSIS — J9621 Acute and chronic respiratory failure with hypoxia: Secondary | ICD-10-CM | POA: Diagnosis present

## 2021-02-20 DIAGNOSIS — G9341 Metabolic encephalopathy: Secondary | ICD-10-CM | POA: Diagnosis present

## 2021-02-20 DIAGNOSIS — J9602 Acute respiratory failure with hypercapnia: Secondary | ICD-10-CM

## 2021-02-20 DIAGNOSIS — M25569 Pain in unspecified knee: Secondary | ICD-10-CM | POA: Diagnosis present

## 2021-02-20 DIAGNOSIS — D509 Iron deficiency anemia, unspecified: Secondary | ICD-10-CM | POA: Diagnosis present

## 2021-02-20 DIAGNOSIS — A403 Sepsis due to Streptococcus pneumoniae: Principal | ICD-10-CM | POA: Diagnosis present

## 2021-02-20 DIAGNOSIS — R6520 Severe sepsis without septic shock: Secondary | ICD-10-CM | POA: Diagnosis present

## 2021-02-20 DIAGNOSIS — J90 Pleural effusion, not elsewhere classified: Secondary | ICD-10-CM | POA: Diagnosis not present

## 2021-02-20 DIAGNOSIS — H905 Unspecified sensorineural hearing loss: Secondary | ICD-10-CM | POA: Diagnosis present

## 2021-02-20 DIAGNOSIS — I5032 Chronic diastolic (congestive) heart failure: Secondary | ICD-10-CM | POA: Diagnosis present

## 2021-02-20 DIAGNOSIS — J9611 Chronic respiratory failure with hypoxia: Secondary | ICD-10-CM

## 2021-02-20 DIAGNOSIS — N400 Enlarged prostate without lower urinary tract symptoms: Secondary | ICD-10-CM | POA: Diagnosis present

## 2021-02-20 DIAGNOSIS — I10 Essential (primary) hypertension: Secondary | ICD-10-CM | POA: Diagnosis present

## 2021-02-20 DIAGNOSIS — J449 Chronic obstructive pulmonary disease, unspecified: Secondary | ICD-10-CM | POA: Diagnosis not present

## 2021-02-20 DIAGNOSIS — Y95 Nosocomial condition: Secondary | ICD-10-CM | POA: Diagnosis present

## 2021-02-20 DIAGNOSIS — I4891 Unspecified atrial fibrillation: Secondary | ICD-10-CM | POA: Diagnosis not present

## 2021-02-20 DIAGNOSIS — N179 Acute kidney failure, unspecified: Secondary | ICD-10-CM | POA: Diagnosis present

## 2021-02-20 DIAGNOSIS — J13 Pneumonia due to Streptococcus pneumoniae: Secondary | ICD-10-CM | POA: Diagnosis present

## 2021-02-20 DIAGNOSIS — E876 Hypokalemia: Secondary | ICD-10-CM | POA: Diagnosis present

## 2021-02-20 DIAGNOSIS — I110 Hypertensive heart disease with heart failure: Secondary | ICD-10-CM | POA: Diagnosis present

## 2021-02-20 DIAGNOSIS — E785 Hyperlipidemia, unspecified: Secondary | ICD-10-CM | POA: Diagnosis present

## 2021-02-20 DIAGNOSIS — J440 Chronic obstructive pulmonary disease with acute lower respiratory infection: Secondary | ICD-10-CM | POA: Diagnosis present

## 2021-02-20 DIAGNOSIS — R1310 Dysphagia, unspecified: Secondary | ICD-10-CM | POA: Diagnosis present

## 2021-02-20 DIAGNOSIS — R296 Repeated falls: Secondary | ICD-10-CM | POA: Diagnosis present

## 2021-02-20 DIAGNOSIS — J9622 Acute and chronic respiratory failure with hypercapnia: Secondary | ICD-10-CM | POA: Diagnosis present

## 2021-02-20 DIAGNOSIS — Z66 Do not resuscitate: Secondary | ICD-10-CM | POA: Diagnosis present

## 2021-02-20 DIAGNOSIS — Z87891 Personal history of nicotine dependence: Secondary | ICD-10-CM

## 2021-02-20 DIAGNOSIS — Z9889 Other specified postprocedural states: Secondary | ICD-10-CM

## 2021-02-20 DIAGNOSIS — R1311 Dysphagia, oral phase: Secondary | ICD-10-CM

## 2021-02-20 DIAGNOSIS — Z79891 Long term (current) use of opiate analgesic: Secondary | ICD-10-CM

## 2021-02-20 DIAGNOSIS — D649 Anemia, unspecified: Secondary | ICD-10-CM

## 2021-02-20 DIAGNOSIS — J189 Pneumonia, unspecified organism: Secondary | ICD-10-CM

## 2021-02-20 DIAGNOSIS — Z7951 Long term (current) use of inhaled steroids: Secondary | ICD-10-CM

## 2021-02-20 DIAGNOSIS — Z880 Allergy status to penicillin: Secondary | ICD-10-CM

## 2021-02-20 DIAGNOSIS — R0600 Dyspnea, unspecified: Secondary | ICD-10-CM

## 2021-02-20 DIAGNOSIS — A419 Sepsis, unspecified organism: Secondary | ICD-10-CM | POA: Diagnosis not present

## 2021-02-20 DIAGNOSIS — G8929 Other chronic pain: Secondary | ICD-10-CM | POA: Diagnosis present

## 2021-02-20 DIAGNOSIS — I480 Paroxysmal atrial fibrillation: Secondary | ICD-10-CM | POA: Diagnosis present

## 2021-02-20 DIAGNOSIS — Z7984 Long term (current) use of oral hypoglycemic drugs: Secondary | ICD-10-CM

## 2021-02-20 DIAGNOSIS — Z7901 Long term (current) use of anticoagulants: Secondary | ICD-10-CM

## 2021-02-20 DIAGNOSIS — Z79899 Other long term (current) drug therapy: Secondary | ICD-10-CM

## 2021-02-20 DIAGNOSIS — Z8701 Personal history of pneumonia (recurrent): Secondary | ICD-10-CM

## 2021-02-20 DIAGNOSIS — F1190 Opioid use, unspecified, uncomplicated: Secondary | ICD-10-CM | POA: Diagnosis present

## 2021-02-20 LAB — RESP PANEL BY RT-PCR (FLU A&B, COVID) ARPGX2
Influenza A by PCR: NEGATIVE
Influenza B by PCR: NEGATIVE
SARS Coronavirus 2 by RT PCR: NEGATIVE

## 2021-02-20 LAB — CBC WITH DIFFERENTIAL/PLATELET
Abs Immature Granulocytes: 0.03 10*3/uL (ref 0.00–0.07)
Basophils Absolute: 0 10*3/uL (ref 0.0–0.1)
Basophils Relative: 0 %
Eosinophils Absolute: 0 10*3/uL (ref 0.0–0.5)
Eosinophils Relative: 0 %
HCT: 32.3 % — ABNORMAL LOW (ref 39.0–52.0)
Hemoglobin: 10.1 g/dL — ABNORMAL LOW (ref 13.0–17.0)
Immature Granulocytes: 0 %
Lymphocytes Relative: 8 %
Lymphs Abs: 0.6 10*3/uL — ABNORMAL LOW (ref 0.7–4.0)
MCH: 31.2 pg (ref 26.0–34.0)
MCHC: 31.3 g/dL (ref 30.0–36.0)
MCV: 99.7 fL (ref 80.0–100.0)
Monocytes Absolute: 0.5 10*3/uL (ref 0.1–1.0)
Monocytes Relative: 6 %
Neutro Abs: 6.6 10*3/uL (ref 1.7–7.7)
Neutrophils Relative %: 86 %
Platelets: 230 10*3/uL (ref 150–400)
RBC: 3.24 MIL/uL — ABNORMAL LOW (ref 4.22–5.81)
RDW: 13 % (ref 11.5–15.5)
WBC: 7.7 10*3/uL (ref 4.0–10.5)
nRBC: 0 % (ref 0.0–0.2)

## 2021-02-20 LAB — URINALYSIS, COMPLETE (UACMP) WITH MICROSCOPIC
Bacteria, UA: NONE SEEN
Bilirubin Urine: NEGATIVE
Glucose, UA: 150 mg/dL — AB
Hgb urine dipstick: NEGATIVE
Ketones, ur: NEGATIVE mg/dL
Leukocytes,Ua: NEGATIVE
Nitrite: NEGATIVE
Protein, ur: NEGATIVE mg/dL
Specific Gravity, Urine: 1.01 (ref 1.005–1.030)
pH: 5 (ref 5.0–8.0)

## 2021-02-20 LAB — COMPREHENSIVE METABOLIC PANEL
ALT: 17 U/L (ref 0–44)
AST: 19 U/L (ref 15–41)
Albumin: 2.6 g/dL — ABNORMAL LOW (ref 3.5–5.0)
Alkaline Phosphatase: 82 U/L (ref 38–126)
Anion gap: 6 (ref 5–15)
BUN: 25 mg/dL — ABNORMAL HIGH (ref 8–23)
CO2: 34 mmol/L — ABNORMAL HIGH (ref 22–32)
Calcium: 8.5 mg/dL — ABNORMAL LOW (ref 8.9–10.3)
Chloride: 98 mmol/L (ref 98–111)
Creatinine, Ser: 1.38 mg/dL — ABNORMAL HIGH (ref 0.61–1.24)
GFR, Estimated: 51 mL/min — ABNORMAL LOW (ref >60)
Glucose, Bld: 345 mg/dL — ABNORMAL HIGH (ref 70–99)
Potassium: 4.6 mmol/L (ref 3.5–5.1)
Sodium: 138 mmol/L (ref 135–145)
Total Bilirubin: 0.5 mg/dL (ref 0.3–1.2)
Total Protein: 6.4 g/dL — ABNORMAL LOW (ref 6.5–8.1)

## 2021-02-20 LAB — BLOOD GAS, VENOUS
Acid-Base Excess: 11.4 mmol/L — ABNORMAL HIGH (ref 0.0–2.0)
Bicarbonate: 40.7 mmol/L — ABNORMAL HIGH (ref 20.0–28.0)
O2 Saturation: 55.6 %
Patient temperature: 37
pCO2, Ven: 79 mmHg (ref 44.0–60.0)
pH, Ven: 7.32 (ref 7.250–7.430)
pO2, Ven: 32 mmHg (ref 32.0–45.0)

## 2021-02-20 LAB — PROTIME-INR
INR: 1.4 — ABNORMAL HIGH (ref 0.8–1.2)
Prothrombin Time: 17.4 seconds — ABNORMAL HIGH (ref 11.4–15.2)

## 2021-02-20 LAB — CBG MONITORING, ED
Glucose-Capillary: 299 mg/dL — ABNORMAL HIGH (ref 70–99)
Glucose-Capillary: 263 mg/dL — ABNORMAL HIGH (ref 70–99)

## 2021-02-20 LAB — APTT: aPTT: 43 seconds — ABNORMAL HIGH (ref 24–36)

## 2021-02-20 LAB — LACTIC ACID, PLASMA
Lactic Acid, Venous: 2.1 mmol/L (ref 0.5–1.9)
Lactic Acid, Venous: 1.5 mmol/L (ref 0.5–1.9)

## 2021-02-20 MED ORDER — TAMSULOSIN HCL 0.4 MG PO CAPS
0.4000 mg | ORAL_CAPSULE | Freq: Every day | ORAL | Status: DC
  Administered 2021-02-21 – 2021-02-26 (×6): 0.4 mg via ORAL
  Filled 2021-02-20 (×6): qty 1

## 2021-02-20 MED ORDER — SODIUM CHLORIDE 0.9 % IV BOLUS (SEPSIS)
500.0000 mL | Freq: Once | INTRAVENOUS | Status: AC
  Administered 2021-02-20: 500 mL via INTRAVENOUS

## 2021-02-20 MED ORDER — INSULIN GLARGINE-YFGN 100 UNIT/ML ~~LOC~~ SOLN
10.0000 [IU] | Freq: Every day | SUBCUTANEOUS | Status: DC
  Filled 2021-02-20: qty 0.1

## 2021-02-20 MED ORDER — FERROUS SULFATE-C-FA ER 105-500-0.8 MG PO TBCR: 1.0000 | EXTENDED_RELEASE_TABLET | Freq: Two times a day (BID) | ORAL | Status: DC

## 2021-02-20 MED ORDER — FINASTERIDE 5 MG PO TABS
5.0000 mg | ORAL_TABLET | Freq: Every day | ORAL | Status: DC
  Administered 2021-02-21 – 2021-02-26 (×6): 5 mg via ORAL
  Filled 2021-02-20 (×7): qty 1

## 2021-02-20 MED ORDER — BUDESONIDE 0.5 MG/2ML IN SUSP
0.5000 mg | Freq: Every day | RESPIRATORY_TRACT | Status: DC
  Administered 2021-02-21 – 2021-02-26 (×6): 0.5 mg via RESPIRATORY_TRACT
  Filled 2021-02-20 (×6): qty 2

## 2021-02-20 MED ORDER — SODIUM CHLORIDE 0.9 % IV SOLN: INTRAVENOUS | Status: DC

## 2021-02-20 MED ORDER — POTASSIUM CHLORIDE CRYS ER 20 MEQ PO TBCR
20.0000 meq | EXTENDED_RELEASE_TABLET | Freq: Every day | ORAL | Status: DC
  Administered 2021-02-21 – 2021-02-26 (×6): 20 meq via ORAL
  Filled 2021-02-20 (×6): qty 1
  Filled 2021-02-20: qty 2

## 2021-02-20 MED ORDER — SODIUM CHLORIDE 0.9 % IV SOLN: 2.0000 g | Freq: Once | INTRAVENOUS | Status: DC

## 2021-02-20 MED ORDER — VANCOMYCIN HCL 1750 MG/350ML IV SOLN
1750.0000 mg | Freq: Once | INTRAVENOUS | Status: AC
  Administered 2021-02-20: 1750 mg via INTRAVENOUS
  Filled 2021-02-20: qty 350

## 2021-02-20 MED ORDER — ATORVASTATIN CALCIUM 20 MG PO TABS
20.0000 mg | ORAL_TABLET | Freq: Every day | ORAL | Status: DC
  Administered 2021-02-21 – 2021-02-25 (×5): 20 mg via ORAL
  Filled 2021-02-20 (×6): qty 1

## 2021-02-20 MED ORDER — SODIUM CHLORIDE 0.9 % IV SOLN
2.0000 g | Freq: Two times a day (BID) | INTRAVENOUS | Status: DC
  Administered 2021-02-20 – 2021-02-21 (×2): 2 g via INTRAVENOUS
  Filled 2021-02-20 (×3): qty 2

## 2021-02-20 MED ORDER — FOLIC ACID 1 MG PO TABS
1.0000 mg | ORAL_TABLET | Freq: Every day | ORAL | Status: DC
  Administered 2021-02-21 – 2021-02-26 (×6): 1 mg via ORAL
  Filled 2021-02-20 (×6): qty 1

## 2021-02-20 MED ORDER — IPRATROPIUM-ALBUTEROL 0.5-2.5 (3) MG/3ML IN SOLN
3.0000 mL | RESPIRATORY_TRACT | Status: DC | PRN
  Administered 2021-02-21 – 2021-02-23 (×3): 3 mL via RESPIRATORY_TRACT
  Filled 2021-02-20 (×3): qty 3

## 2021-02-20 MED ORDER — INSULIN ASPART 100 UNIT/ML IJ SOLN
0.0000 [IU] | INTRAMUSCULAR | Status: DC
  Administered 2021-02-21: 8 [IU] via SUBCUTANEOUS
  Administered 2021-02-21 (×2): 3 [IU] via SUBCUTANEOUS
  Administered 2021-02-21: 11 [IU] via SUBCUTANEOUS
  Administered 2021-02-21: 8 [IU] via SUBCUTANEOUS
  Administered 2021-02-21: 2 [IU] via SUBCUTANEOUS
  Administered 2021-02-22: 5 [IU] via SUBCUTANEOUS
  Administered 2021-02-22: 8 [IU] via SUBCUTANEOUS
  Administered 2021-02-22: 3 [IU] via SUBCUTANEOUS
  Administered 2021-02-22: 2 [IU] via SUBCUTANEOUS
  Administered 2021-02-22: 11 [IU] via SUBCUTANEOUS
  Administered 2021-02-22: 15 [IU] via SUBCUTANEOUS
  Administered 2021-02-23: 3 [IU] via SUBCUTANEOUS
  Administered 2021-02-23: 15 [IU] via SUBCUTANEOUS
  Administered 2021-02-23: 8 [IU] via SUBCUTANEOUS
  Administered 2021-02-23 (×2): 5 [IU] via SUBCUTANEOUS
  Administered 2021-02-23: 3 [IU] via SUBCUTANEOUS
  Administered 2021-02-24: 2 [IU] via SUBCUTANEOUS
  Administered 2021-02-24 (×2): 3 [IU] via SUBCUTANEOUS
  Administered 2021-02-24: 8 [IU] via SUBCUTANEOUS
  Administered 2021-02-25: 5 [IU] via SUBCUTANEOUS
  Administered 2021-02-25: 3 [IU] via SUBCUTANEOUS
  Administered 2021-02-25: 5 [IU] via SUBCUTANEOUS
  Administered 2021-02-25: 11 [IU] via SUBCUTANEOUS
  Administered 2021-02-26: 3 [IU] via SUBCUTANEOUS
  Administered 2021-02-26: 11 [IU] via SUBCUTANEOUS
  Filled 2021-02-20 (×28): qty 1

## 2021-02-20 MED ORDER — MAGNESIUM OXIDE -MG SUPPLEMENT 400 (240 MG) MG PO TABS
400.0000 mg | ORAL_TABLET | Freq: Two times a day (BID) | ORAL | Status: DC
  Filled 2021-02-20: qty 1

## 2021-02-20 MED ORDER — RIVAROXABAN 20 MG PO TABS: 20.0000 mg | ORAL_TABLET | Freq: Every day | ORAL | Status: DC

## 2021-02-20 MED ORDER — SODIUM CHLORIDE 0.9 % IV BOLUS (SEPSIS)
1000.0000 mL | Freq: Once | INTRAVENOUS | Status: AC
  Administered 2021-02-20: 1000 mL via INTRAVENOUS

## 2021-02-20 MED ORDER — INSULIN ASPART 100 UNIT/ML IJ SOLN: 0.0000 [IU] | Freq: Every day | INTRAMUSCULAR | Status: DC

## 2021-02-20 MED ORDER — SODIUM CHLORIDE 0.9 % IV SOLN
2.0000 g | INTRAVENOUS | Status: DC
  Administered 2021-02-20: 2 g via INTRAVENOUS
  Filled 2021-02-20: qty 20

## 2021-02-20 MED ORDER — SODIUM CHLORIDE 0.9 % IV SOLN
500.0000 mg | INTRAVENOUS | Status: DC
  Administered 2021-02-20: 500 mg via INTRAVENOUS
  Filled 2021-02-20: qty 500

## 2021-02-20 MED ORDER — GUAIFENESIN-DM 100-10 MG/5ML PO SYRP: 10.0000 mL | ORAL_SOLUTION | ORAL | Status: DC | PRN

## 2021-02-20 MED ORDER — TIOTROPIUM BROMIDE MONOHYDRATE 18 MCG IN CAPS
18.0000 ug | ORAL_CAPSULE | Freq: Every day | RESPIRATORY_TRACT | Status: DC
  Administered 2021-02-21 – 2021-02-26 (×6): 18 ug via RESPIRATORY_TRACT
  Filled 2021-02-20 (×2): qty 5

## 2021-02-20 MED ORDER — DOFETILIDE 125 MCG PO CAPS
125.0000 ug | ORAL_CAPSULE | Freq: Two times a day (BID) | ORAL | Status: DC
  Administered 2021-02-21 – 2021-02-26 (×11): 125 ug via ORAL
  Filled 2021-02-20 (×13): qty 1

## 2021-02-20 MED ORDER — PANTOPRAZOLE SODIUM 40 MG PO TBEC
40.0000 mg | DELAYED_RELEASE_TABLET | Freq: Every day | ORAL | Status: DC
  Administered 2021-02-21 – 2021-02-26 (×6): 40 mg via ORAL
  Filled 2021-02-20 (×6): qty 1

## 2021-02-20 MED ORDER — INSULIN ASPART 100 UNIT/ML IJ SOLN: 0.0000 [IU] | Freq: Three times a day (TID) | INTRAMUSCULAR | Status: DC

## 2021-02-20 NOTE — ED Provider Notes (Signed)
[facility] Emergency Department Provider Note  ____________________________________________  Time seen: Approximately 7:22 PM  I have reviewed the triage vital signs and the nursing notes.   HISTORY  Chief Complaint Altered Mental Status and Hyperglycemia    Level 5 Caveat: Portions of the History and Physical including HPI and review of systems are unable to be completely obtained due to patient being a poor historian   HPI Samuel Thomas is a 85 y.o. male with a history of hypertension, diabetes, COPD on 2 L nasal cannula at baseline who is sent to the ED today due to altered mental status and hyperglycemia.  Patient reportedly started at about 4:30 PM today.  EMS notes blood sugar was about 500.  Oxygen has been 92% on his chronic 2 L nasal cannula.    Past Medical History:  Diagnosis Date   COPD (chronic obstructive pulmonary disease) (HCC)    DM2 (diabetes mellitus, type 2) (HCC)    HLD (hyperlipidemia)    HTN (hypertension)      Patient Active Problem List   Diagnosis Date Noted   Hyperglycemia due to type 2 diabetes mellitus (HCC) 02/20/2021   Acute on chronic anemia 02/20/2021   Acute respiratory failure with hypercapnia (HCC) 02/20/2021   Aspiration pneumonia (HCC) 02/20/2021   Acute metabolic encephalopathy 02/20/2021   Sepsis (HCC) 02/20/2021   Pneumonia due to infectious organism    Fall 02/06/2021   Generalized weakness 02/06/2021   Tobacco abuse 02/06/2021   Pain due to onychomycosis of toenails of both feet 05/20/2020   History of gout 04/02/2020   Acute encephalopathy 10/27/2017   Syncope and collapse 10/27/2017   Incomplete emptying of bladder 07/20/2017   Overactive detrusor 07/20/2017   Atrial fibrillation with RVR (HCC) 07/01/2017   Hearing loss, sensorineural, asymmetrical 03/09/2017   At risk for falls 03/24/2016   Polypharmacy 03/24/2016   Benign fibroma of prostate 09/10/2015   Diastolic heart failure (HCC) 08/16/2015    Pulmonary hypertension (HCC) 08/16/2015   Cough 08/15/2015   Breath shortness 08/15/2015   BP (high blood pressure) 07/24/2015   BPH with obstruction/lower urinary tract symptoms 07/02/2015   Diverticulitis large intestine 01/03/2015   AKI (acute kidney injury) (HCC) 01/02/2015   N&V (nausea and vomiting) 01/02/2015   Chronic pain associated with significant psychosocial dysfunction 10/04/2014   Chronic pain syndrome 10/04/2014   Encounter for monitoring opioid maintenance therapy 08/29/2014   Arthritis 07/24/2014   Venous stasis ulcer of lower extremity (HCC) 07/24/2014   Chronic pain of right knee 07/03/2014   Diabetes mellitus (HCC) 05/17/2014   Obstructive apnea 04/25/2014   Drug-induced constipation 04/03/2014   Arthritis of knee, degenerative 01/30/2014   Bacterial skin infection of leg 01/16/2014   Chronic diastolic heart failure (HCC) 01/16/2014   Edema leg 01/16/2014   Cellulitis of lower extremity 01/16/2014   Chronic combined systolic and diastolic heart failure (HCC) 01/02/2014   Acute diastolic heart failure (HCC) 12/02/2013   NSVT (nonsustained ventricular tachycardia) 12/01/2013   SIRS (systemic inflammatory response syndrome) (HCC) 12/01/2013   Altered mental status 12/01/2013   Hypokalemia 12/01/2013   DM2 (diabetes mellitus, type 2) (HCC) 12/01/2013   HTN (hypertension) 12/01/2013   Hypoxia 12/01/2013   Chronic, continuous use of opioids 09/18/2013   Pain in shoulder 07/21/2013   Pain of right hand 05/24/2013   Bilateral hand pain 05/24/2013   Other long term (current) drug therapy 01/03/2013   High risk medication use 01/03/2013   Muscle ache 11/23/2012   Suicidal  ideation 11/23/2012   Cellulitis 09/25/2012   COPD (chronic obstructive pulmonary disease) (HCC) 09/25/2012   Apnea, sleep 09/25/2012   Diabetes mellitus type 2 in obese (HCC) 08/15/2012   Hook nail 08/15/2012   Fungal infection of nail 08/15/2012   Arthropathy of lumbar facet joint  07/08/2012   Nerve root pain 07/07/2012   Cannabis abuse 07/07/2012   Marijuana use 07/07/2012   Status post cataract extraction 06/16/2012   Artificial lens present 06/16/2012   Other specified health status 05/10/2012   Depression 05/10/2012   Alcohol use 05/10/2012   Lumbar canal stenosis 03/17/2012   Degenerative arthritis of hip 03/17/2012   Adiposity 03/17/2012   H/O arthrodesis 03/17/2012   S/P cervical spinal fusion 03/17/2012   Arthralgia of hip or thigh 01/12/2012   Lumbosacral spondylosis 12/10/2011   Pain in thoracic spine 12/10/2011   Brachial neuritis 10/27/2011   Hypercholesteremia 08/25/2011   Hypercholesterolemia 08/25/2011   Cervical spinal stenosis 07/27/2011   Cervical spondylosis with myelopathy 07/17/2011   Difficulty walking 07/13/2011   Thoracic and lumbosacral neuritis 07/13/2011   Diverticulosis of colon 05/25/2011   History of colonic polyps 05/25/2011   Diverticular disease of large intestine 05/25/2011   Astigmatism 04/10/2011   Cataract, nuclear sclerotic senile 04/10/2011   Diabetes mellitus type 2, uncontrolled 04/10/2011   Bilateral low back pain without sciatica 03/31/2011   Allergic rhinitis 06/20/2002   Asthma 06/20/2002   Moderate persistent asthma without complication 06/20/2002   Coronary atherosclerosis 06/17/2000   Essential hypertension 06/17/2000     Past Surgical History:  Procedure Laterality Date   HERNIA REPAIR       Prior to Admission medications   Medication Sig Start Date End Date Taking? Authorizing Provider  atorvastatin (LIPITOR) 20 MG tablet Take 20 mg by mouth at bedtime.   Yes [provider]  budesonide (PULMICORT) 0.5 MG/2ML nebulizer solution Inhale 0.5 mg into the lungs daily. 02/07/15  Yes [provider]  Calcium Carb-Cholecalciferol 600-400 MG-UNIT TABS Take 1 tablet by mouth daily.   Yes [provider]  cetirizine (ZYRTEC) 10 MG tablet cetirizine 10 mg tablet  Take 1 tablet  every day by oral route.   Yes [provider]  diltiazem (CARDIZEM CD) 240 MG 24 hr capsule Take 1 capsule (240 mg total) by mouth daily. 02/14/21  Yes Arnetha Courser, MD  dofetilide (TIKOSYN) 125 MCG capsule Take 125 mcg by mouth 2 (two) times daily. 11/29/17  Yes [provider]  Ferrous Sulfate-C-Folic Acid 105-500-0.8 MG TBCR Take 1 tablet by mouth 2 (two) times daily. 02/13/21  Yes Arnetha Courser, MD  finasteride (PROSCAR) 5 MG tablet Take 5 mg by mouth daily. 10/31/18  Yes [provider]  fluticasone (FLONASE) 50 MCG/ACT nasal spray Place 1-2 sprays into both nostrils daily.   Yes [provider]  folic acid (FOLVITE) 1 MG tablet Take 1 mg by mouth daily.   Yes [provider]  Lactobacillus Acid-Pectin (ACIDOPHILUS/PECTIN) CAPS Acidophilus Probiotic 100 million cell-10 mg capsule  Take 1 capsule every day by oral route.   Yes [provider]  albuterol (VENTOLIN HFA) 108 (90 Base) MCG/ACT inhaler Ventolin HFA 90 mcg/actuation aerosol inhaler  Inhale 2 puffs by mouth every 6 hours as needed for wheezing 10/06/19   [provider]  colchicine 0.6 MG tablet Take 0.6 mg by mouth as needed (gout). 06/13/20 06/13/21  [provider]  furosemide (LASIX) 40 MG tablet Take 1 tablet (40 mg total) by mouth daily. 12/07/13  Zannie Cove, MD  gabapentin (NEURONTIN) 300 MG capsule Take 300 mg by mouth 3 (three) times daily. 01/17/21   [provider]  guaiFENesin-dextromethorphan (ROBITUSSIN DM) 100-10 MG/5ML syrup Take 10 mLs by mouth every 4 (four) hours as needed for cough. 02/13/21   Arnetha Courser, MD  insulin lispro (HUMALOG) 100 UNIT/ML injection Inject 6 Units into the skin 3 (three) times daily before meals.    [provider]  ipratropium-albuterol (DUONEB) 0.5-2.5 (3) MG/3ML SOLN Inhale into the lungs. 04/16/19 11/10/20  [provider]  magnesium oxide (MAG-OX) 400 (240 Mg) MG tablet Take 1 tablet (400 mg  total) by mouth 2 (two) times daily. 02/13/21   Arnetha Courser, MD  metFORMIN (GLUCOPHAGE) 1000 MG tablet Take 1,000 mg by mouth 2 (two) times daily. 12/17/20   [provider]  Multiple Vitamin (ONE DAILY) tablet Take 1 tablet by mouth daily.    [provider]  Nutritional Supplements (FEEDING SUPPLEMENT, NEPRO CARB STEADY,) LIQD Take 237 mLs by mouth 2 (two) times daily between meals. 02/13/21   Arnetha Courser, MD  omeprazole (PRILOSEC) 20 MG capsule Take 20 mg by mouth daily. 06/26/20 06/26/21  [provider]  polyethylene glycol (MIRALAX / GLYCOLAX) 17 g packet Take 17 g by mouth daily as needed for mild constipation. 02/13/21   Arnetha Courser, MD  potassium chloride SA (K-DUR,KLOR-CON) 20 MEQ tablet Take 20 mEq by mouth daily. 02/02/14   [provider]  rivaroxaban (XARELTO) 20 MG TABS tablet Take 1 tablet (20 mg total) by mouth daily with supper. 02/13/21   Arnetha Courser, MD  senna-docusate (SENOKOT-S) 8.6-50 MG tablet Senna with Docusate Sodium 8.6 mg-50 mg tablet  Take 2 tablets twice a day by oral route.    [provider]  tamsulosin (FLOMAX) 0.4 MG CAPS capsule Take 0.4 mg by mouth daily.    [provider]  tiotropium (SPIRIVA HANDIHALER) 18 MCG inhalation capsule Spiriva with HandiHaler 18 mcg and inhalation capsules    [provider]  TRUETRACK TEST test strip  02/21/14   [provider]     Allergies Penicillins, Apixaban, and Prednisone   No family history on file.  Social History Social History   Tobacco Use   Smoking status: Former    Types: Cigarettes    Quit date: 12/01/1973    Years since quitting: 47.2   Smokeless tobacco: Never  Substance Use Topics   Alcohol use: No    Alcohol/week: 0.0 standard drinks   Drug use: No    Review of Systems Level 5 Caveat: Portions of the History and Physical including HPI and review of systems are unable to be completely obtained due to patient being a poor  historian   Constitutional:   No known fever.  ENT:   No rhinorrhea. Cardiovascular:   No chest pain or syncope. Respiratory:   Positive shortness of breath and cough. Gastrointestinal:   Negative for abdominal pain, vomiting and diarrhea.  Musculoskeletal:   Negative for focal pain or swelling ____________________________________________   PHYSICAL EXAM:  VITAL SIGNS: ED Triage Vitals  Enc Vitals Group     BP 02/20/21 1743 111/60     Pulse Rate 02/20/21 1743 (!) 117     Resp 02/20/21 1743 (!) 41     Temp 02/20/21 1743 98.3 F (36.8 C)     Temp Source 02/20/21 1743 Oral     SpO2 02/20/21 1743 100 %     Weight --      Height --  Head Circumference --      Peak Flow --      Pain Score 02/20/21 1744 0     Pain Loc --      Pain Edu? --      Excl. in GC? --     Vital signs reviewed, nursing assessments reviewed.   Constitutional:   Awake, not alert, not oriented.  Ill-appearing.. Eyes:   Conjunctivae are normal. EOMI. PERRL. ENT      Head:   Normocephalic and atraumatic.      Nose:   No congestion/rhinnorhea.       Mouth/Throat:   Dry mucous membranes, no pharyngeal erythema. No peritonsillar mass.       Neck:   No meningismus. Full ROM. Hematological/Lymphatic/Immunilogical:   No cervical lymphadenopathy. Cardiovascular:   Tachycardia heart rate 110. Symmetric bilateral radial and DP pulses.  No murmurs. Cap refill less than 2 seconds. Respiratory:   Tachypnea with Kussmaul-like respirations. Gastrointestinal:   Soft and nontender. Non distended. There is no CVA tenderness.  No rebound, rigidity, or guarding. Genitourinary:   deferred Musculoskeletal:   Normal range of motion in all extremities. No joint effusions.  No lower extremity tenderness.  No edema. Neurologic:   Mumbling incoherent speech Motor grossly intact.  Skin:    Skin is warm, dry and intact. No rash noted.  No petechiae, purpura, or bullae.  ____________________________________________     LABS (pertinent positives/negatives) (all labs ordered are listed, but only abnormal results are displayed) Labs Reviewed  LACTIC ACID, PLASMA - Abnormal; Notable for the following components:      Result Value   Lactic Acid, Venous 2.1 (*)    All other components within normal limits  COMPREHENSIVE METABOLIC PANEL - Abnormal; Notable for the following components:   CO2 34 (*)    Glucose, Bld 345 (*)    BUN 25 (*)    Creatinine, Ser 1.38 (*)    Calcium 8.5 (*)    Total Protein 6.4 (*)    Albumin 2.6 (*)    GFR, Estimated 51 (*)    All other components within normal limits  CBC WITH DIFFERENTIAL/PLATELET - Abnormal; Notable for the following components:   RBC 3.24 (*)    Hemoglobin 10.1 (*)    HCT 32.3 (*)    Lymphs Abs 0.6 (*)    All other components within normal limits  PROTIME-INR - Abnormal; Notable for the following components:   Prothrombin Time 17.4 (*)    INR 1.4 (*)    All other components within normal limits  APTT - Abnormal; Notable for the following components:   aPTT 43 (*)    All other components within normal limits  URINALYSIS, COMPLETE (UACMP) WITH MICROSCOPIC - Abnormal; Notable for the following components:   Color, Urine YELLOW (*)    APPearance CLEAR (*)    Glucose, UA 150 (*)    All other components within normal limits  BLOOD GAS, VENOUS - Abnormal; Notable for the following components:   pCO2, Ven 79 (*)    Bicarbonate 40.7 (*)    Acid-Base Excess 11.4 (*)    All other components within normal limits  CBG MONITORING, ED - Abnormal; Notable for the following components:   Glucose-Capillary 299 (*)    All other components within normal limits  RESP PANEL BY RT-PCR (FLU A&B, COVID) [id number]  CULTURE, BLOOD (ROUTINE X 2)  CULTURE, BLOOD (ROUTINE X 2)  URINE CULTURE  LACTIC ACID, PLASMA   ____________________________________________   EKG  Interpreted by me Atrial fibrillation, rate of 118.  Normal axis and intervals.  Poor R wave  progression.  Normal ST segments and T waves.  No acute ischemic changes.  ____________________________________________    RADIOLOGY  DG Chest Port 1 View  Result Date: 02/20/2021 CLINICAL DATA:  Question sepsis. EXAM: PORTABLE CHEST 1 VIEW COMPARISON:  02/10/2021 FINDINGS: Interval progression of extensive infiltrate in the right mid and lower lung. Progression of small to moderate right pleural effusion Left perihilar airspace disease is present with mild improvement. No effusion on the left. Negative for heart failure. IMPRESSION: Progressive airspace disease in the right mid and lower lung with progressive right effusion. Possible progressive pneumonia. Underlying mass lesion possible. Consider chest CT with contrast for further evaluation. Left perihilar airspace densities shows interval improvement. Electronically Signed   By: Marlan Palau M.D.   On: 02/20/2021 17:51    ____________________________________________   PROCEDURES .Critical Care Performed by: Sharman Cheek, MD Authorized by: Sharman Cheek, MD   Critical care provider statement:    Critical care time (minutes):  35   Critical care time was exclusive of:  Separately billable procedures and treating other patients   Critical care was necessary to treat or prevent imminent or life-threatening deterioration of the following conditions:  Sepsis and respiratory failure   Critical care was time spent personally by me on the following activities:  Development of treatment plan with patient or surrogate, discussions with consultants, evaluation of patient's response to treatment, examination of patient, obtaining history from patient or surrogate, ordering and performing treatments and interventions, ordering and review of laboratory studies, ordering and review of radiographic studies, pulse oximetry, re-evaluation of patient's condition and review of old  charts  ____________________________________________  DIFFERENTIAL DIAGNOSIS   Pneumonia, UTI, DKA, dehydration, electrolyte normality  CLINICAL IMPRESSION / ASSESSMENT AND PLAN / ED COURSE  Medications ordered in the ED: Medications  cefTRIAXone (ROCEPHIN) 2 g in sodium chloride 0.9 % 100 mL IVPB (0 g Intravenous Stopped 02/20/21 1836)  azithromycin (ZITHROMAX) 500 mg in sodium chloride 0.9 % 250 mL IVPB (500 mg Intravenous Incomplete 02/20/21 1818)  sodium chloride 0.9 % bolus 1,000 mL (1,000 mLs Intravenous New Bag/Given 02/20/21 1809)    Pertinent labs & imaging results that were available during my care of the patient were reviewed by me and considered in my medical decision making (see chart for details).   Samuel Thomas was evaluated in Emergency Department on 02/20/2021 for the symptoms described in the history of present illness. He was evaluated in the context of the global COVID-19 pandemic, which necessitated consideration that the patient might be at risk for infection with the SARS-CoV-2 virus that causes COVID-19. Institutional protocols and algorithms that pertain to the evaluation of patients at risk for COVID-19 are in a state of rapid change based on information released by regulatory bodies including the CDC and federal and state organizations. These policies and algorithms were followed during the patient's care in the ED.     Clinical Course as of 02/20/21 1922  Thu Feb 20, 2021  1733 Patient presents with tachycardia tachypnea altered mental status, most concerning for sepsis due to pneumonia versus UTI.  Will give Rocephin and azithromycin while obtaining work-up.  We will also include VBG, concern for DKA. [PS]    Clinical Course User Index [PS] Sharman Cheek, MD    ----------------------------------------- 7:24 PM on 02/20/2021 ----------------------------------------- Labs show a lactate of 2.1.  Chemistry panel shows some evidence of dehydration  without  metabolic acidosis.  Blood counts stable.  VBG consistent with chronic compensated respiratory acidosis.  Due to patient's comorbidities and acute illness, warrants hospitalization for IV antibiotics and supportive care.  Presentation is indicative of sepsis, but not septic shock.   ____________________________________________   FINAL CLINICAL IMPRESSION(S) / ED DIAGNOSES    Final diagnoses:  Community acquired pneumonia, unspecified laterality  Chronic respiratory failure with hypoxia (HCC)  Sepsis, due to unspecified organism, unspecified whether acute organ dysfunction present (HCC)  Type 2 diabetes mellitus with hyperglycemia, unspecified whether long term insulin use [facility])     ED Discharge Orders     None       Portions of this note were generated with dragon dictation software. Dictation errors may occur despite best attempts at proofreading.   Sharman Cheek, MD 02/20/21 [phone]

## 2021-02-20 NOTE — Progress Notes (Signed)
Elink following code sepsis °

## 2021-02-20 NOTE — ED Notes (Signed)
This RN to bedside for in and out cath. Completed without difficulty.

## 2021-02-20 NOTE — Consult Note (Signed)
Pharmacy Antibiotic Note  Samuel Thomas is a 85 y.o. male admitted on 02/20/2021 with pneumonia.  Pharmacy has been consulted for Vancomycin and Cefepime dosing. Of note, patient has stated allergy to PCN but received Rocephin in ED with no complications.  Plan: 1) Vancomycin loading dose of 1750mg  x1 to be given in ED. Vancomycin 1000 mg IV Q 24 hrs. Goal AUC 400-550. Expected AUC: 481.5 Expected Css: 13.1 SCr used: 1.38  2) Cefepime 2g IV Q12 hours   Weight: 80.4 kg (177 lb 4 oz)  Temp (24hrs), Avg:98.3 F (36.8 C), Min:98.3 F (36.8 C), Max:98.3 F (36.8 C)  Recent Labs  Lab 02/20/21 1743  WBC 7.7  CREATININE 1.38*  LATICACIDVEN 2.1*    Estimated Creatinine Clearance: 41.2 mL/min (A) (by C-G formula based on SCr of 1.38 mg/dL (H)).    Allergies  Allergen Reactions   Penicillins Rash and Swelling    Break outs swelling Other reaction(s): SWELLING in 1955 Break outs swelling Other reaction(s): SWELLING in 1955 Break outs swelling Other reaction(s): SWELLING in 1955 Break outs swelling Break outs swelling Other reaction(s): SWELLING in 1955 Break outs swelling Other reaction(s): SWELLING in 1955 Break outs swelling Break outs swelling Other reaction(s): SWELLING in 1955 Break outs swelling Other reaction(s): SWELLING in 1955 Break outs swelling Other reaction(s): SWELLING in 1955 Break outs swelling Break outs swelling Other reaction(s): SWELLING in 1955  Break outs swelling Other reaction(s): SWELLING in 1955 Break outs swelling Other reaction(s): SWELLING in 1955 Break outs swelling Other reaction(s): SWELLING in 1955 Break outs swelling Break outs swelling Other reaction(s): SWELLING in 1955 Break outs swelling Other reaction(s): SWELLING in 1955 Break outs swelling Break outs swelling Other reaction(s): SWELLING in 1955    Apixaban Other (See Comments)    Cold intolerance Cold intolerance    Prednisone Other (See Comments) and Rash     Makes mean  Significant mood and behavioral changes - 'felt like I wasn't in control of anything' Makes mean  Significant mood and behavioral changes - 'felt like I wasn't in control of anything' Makes mean  Significant mood and behavioral changes - 'felt like I wasn't in control of anything' Significant mood and behavioral changes - 'felt like I wasn't in control of anything' Makes mean  Significant mood and behavioral changes - 'felt like I wasn't in control of anything' Makes mean  Significant mood and behavioral changes - 'felt like I wasn't in control of anything' Makes mean  Significant mood and behavioral changes - 'felt like I wasn't in control of anything' Significant mood and behavioral changes - 'felt like I wasn't in control of anything' Makes mean  Makes mean  Significant mood and behavioral changes - 'felt like I wasn't in control of anything' Makes mean  Significant mood and behavioral changes - 'felt like I wasn't in control of anything' Makes mean  Significant mood and behavioral changes - 'felt like I wasn't in control of anything' Significant mood and behavioral changes - 'felt like I wasn't in control of anything' Makes mean  Significant mood and behavioral changes - 'felt like I wasn't in control of anything' Makes mean  Significant mood and behavioral changes - 'felt like I wasn't in control of anything'  Makes mean  Significant mood and behavioral changes - 'felt like I wasn't in control of anything' Makes mean  Significant mood and behavioral changes - 'felt like I wasn't in control of anything' Makes mean  Significant mood and behavioral changes - 'felt like I  wasn't in control of anything' Significant mood and behavioral changes - 'felt like I wasn't in control of anything' Makes mean  Significant mood and behavioral changes - 'felt like I wasn't in control of anything' Makes mean  Significant mood and behavioral changes - 'felt like I wasn't in control  of anything' Makes mean  Significant mood and behavioral changes - 'felt like I wasn't in control of anything' Significant mood and behavioral changes - 'felt like I wasn't in control of anything' Makes mean  Makes mean  Significant mood and behavioral changes - 'felt like I wasn't in control of anything'     Antimicrobials this admission: Vancomycin 11/10 >>  Cefepime 11/10 >>  Rocephin 11/10 x 1 dose    Microbiology results: 11/10 BCx: pending 11/10 UCx: pending  11/10 MRSA PCR: pending  Thank you for allowing pharmacy to be a part of this patient's care.  Annaliesa Blann A Yesmin Mutch 02/20/2021 8:25 PM

## 2021-02-20 NOTE — ED Triage Notes (Signed)
Pt. To ED via EMS for AMS since 1630 tonight. [facility] staff states pt's BGL's have been in the 500's x several days. Pt. Is on 2L [place] at baseline. Pt. 85% RA. Hx of a-fib, on xarelto for same.

## 2021-02-20 NOTE — H&P (Signed)
History and Physical    ELICEO GLADU WGN:[id number] DOB: [birth year] DOA: 02/20/2021  PCP: Jenell Milliner, MD   Patient coming from: SNF  I have personally briefly reviewed patient's relevant medical records in [facility]  Chief Complaint: Altered mental status, hyperglycemia  HPI: Samuel Thomas is a 85 y.o. male with medical history significant for DM, HTN, diastolic heart failure, paroxysmal A. fib on Xarelto and Tikosyn, thyroid nodules, iron deficiency anemia, frequent falls, COPD, hearing impairment chronic opioid use, recently hospitalized for pneumonia from 10/27-11/4 and discharged to rehab , who presents to the ED from rehab due to blood sugar in the 500s for several days and altered mental status described as increased confusion.  History obtained mostly from ER provider and ER documentation due to patient's confusion.  O2 sat 85% on room air  ED course: On arrival, afebrile but tachycardic at 117 and tachypneic at 41 with O2 sat in the high 90s on 2 L. Blood work: WBC normal but with lactic acid 2.1 Hemoglobin 10.1, down from 12.1 2 weeks prior Creatinine 1.38, up from 0.73 2 weeks prior Blood glucose 345 VBG on 2 L: pH 7.32 with PCO2 7.9  EKG, personally reviewed and interpreted: A. fib at 118 with no acute ST-T wave changes  Chest x-ray: Progressive airspace disease right mid and lower lung with progressive right effusion.  Possible progressive pneumonia underlying mass lesion possible.  Consider CT chest for further evaluation.  Left perihilar airspace densities show interval improvement  Patient started on Rocephin and azithromycin.  Hospitalist consulted for admission.    Review of Systems: Unable to obtain due to altered mental status  Past Medical History:  Diagnosis Date   COPD (chronic obstructive pulmonary disease) (HCC)    DM2 (diabetes mellitus, type 2) (HCC)    HLD (hyperlipidemia)    HTN (hypertension)     Past Surgical History:  Procedure  Laterality Date   HERNIA REPAIR       reports that he quit smoking about 47 years ago. His smoking use included cigarettes. He has never used smokeless tobacco. He reports that he does not drink alcohol and does not use drugs.  Allergies  Allergen Reactions   Penicillins Rash and Swelling    Break outs swelling Other reaction(s): SWELLING in 1955 Break outs swelling Other reaction(s): SWELLING in 1955 Break outs swelling Other reaction(s): SWELLING in 1955 Break outs swelling Break outs swelling Other reaction(s): SWELLING in 1955 Break outs swelling Other reaction(s): SWELLING in 1955 Break outs swelling Break outs swelling Other reaction(s): SWELLING in 1955 Break outs swelling Other reaction(s): SWELLING in 1955 Break outs swelling Other reaction(s): SWELLING in 1955 Break outs swelling Break outs swelling Other reaction(s): SWELLING in 1955  Break outs swelling Other reaction(s): SWELLING in 1955 Break outs swelling Other reaction(s): SWELLING in 1955 Break outs swelling Other reaction(s): SWELLING in 1955 Break outs swelling Break outs swelling Other reaction(s): SWELLING in 1955 Break outs swelling Other reaction(s): SWELLING in 1955 Break outs swelling Break outs swelling Other reaction(s): SWELLING in 1955    Apixaban Other (See Comments)    Cold intolerance Cold intolerance    Prednisone Other (See Comments) and Rash    Makes mean  Significant mood and behavioral changes - 'felt like I wasn't in control of anything' Makes mean  Significant mood and behavioral changes - 'felt like I wasn't in control of anything' Makes mean  Significant mood and behavioral changes - 'felt like I wasn't in control of anything'  Significant mood and behavioral changes - 'felt like I wasn't in control of anything' Makes mean  Significant mood and behavioral changes - 'felt like I wasn't in control of anything' Makes mean  Significant mood and behavioral changes - 'felt  like I wasn't in control of anything' Makes mean  Significant mood and behavioral changes - 'felt like I wasn't in control of anything' Significant mood and behavioral changes - 'felt like I wasn't in control of anything' Makes mean  Makes mean  Significant mood and behavioral changes - 'felt like I wasn't in control of anything' Makes mean  Significant mood and behavioral changes - 'felt like I wasn't in control of anything' Makes mean  Significant mood and behavioral changes - 'felt like I wasn't in control of anything' Significant mood and behavioral changes - 'felt like I wasn't in control of anything' Makes mean  Significant mood and behavioral changes - 'felt like I wasn't in control of anything' Makes mean  Significant mood and behavioral changes - 'felt like I wasn't in control of anything'  Makes mean  Significant mood and behavioral changes - 'felt like I wasn't in control of anything' Makes mean  Significant mood and behavioral changes - 'felt like I wasn't in control of anything' Makes mean  Significant mood and behavioral changes - 'felt like I wasn't in control of anything' Significant mood and behavioral changes - 'felt like I wasn't in control of anything' Makes mean  Significant mood and behavioral changes - 'felt like I wasn't in control of anything' Makes mean  Significant mood and behavioral changes - 'felt like I wasn't in control of anything' Makes mean  Significant mood and behavioral changes - 'felt like I wasn't in control of anything' Significant mood and behavioral changes - 'felt like I wasn't in control of anything' Makes mean  Makes mean  Significant mood and behavioral changes - 'felt like I wasn't in control of anything'     History reviewed. No pertinent family history.    Prior to Admission medications   Medication Sig Start Date End Date Taking? Authorizing Provider  atorvastatin (LIPITOR) 20 MG tablet Take 20 mg by mouth at bedtime.   Yes  [provider]  budesonide (PULMICORT) 0.5 MG/2ML nebulizer solution Inhale 0.5 mg into the lungs daily. 02/07/15  Yes [provider]  Calcium Carb-Cholecalciferol 600-400 MG-UNIT TABS Take 1 tablet by mouth daily.   Yes [provider]  cetirizine (ZYRTEC) 10 MG tablet cetirizine 10 mg tablet  Take 1 tablet every day by oral route.   Yes [provider]  diltiazem (CARDIZEM CD) 240 MG 24 hr capsule Take 1 capsule (240 mg total) by mouth daily. 02/14/21  Yes Arnetha Courser, MD  dofetilide (TIKOSYN) 125 MCG capsule Take 125 mcg by mouth 2 (two) times daily. 11/29/17  Yes [provider]  Ferrous Sulfate-C-Folic Acid 105-500-0.8 MG TBCR Take 1 tablet by mouth 2 (two) times daily. 02/13/21  Yes Arnetha Courser, MD  finasteride (PROSCAR) 5 MG tablet Take 5 mg by mouth daily. 10/31/18  Yes [provider]  fluticasone (FLONASE) 50 MCG/ACT nasal spray Place 1-2 sprays into both nostrils daily.   Yes [provider]  folic acid (FOLVITE) 1 MG tablet Take 1 mg by mouth daily.   Yes [provider]  furosemide (LASIX) 40 MG tablet Take 1 tablet (40 mg total) by mouth daily. 12/07/13  Yes Zannie Cove, MD  gabapentin (NEURONTIN) 300 MG capsule Take 300 mg by mouth  3 (three) times daily. 01/17/21  Yes [provider]  insulin glargine (LANTUS) 100 unit/mL SOPN Inject 10 Units into the skin at bedtime.   Yes [provider]  insulin lispro (HUMALOG) 100 UNIT/ML injection Inject 6 Units into the skin 3 (three) times daily before meals.   Yes [provider]  Lactobacillus Acid-Pectin (ACIDOPHILUS/PECTIN) CAPS Acidophilus Probiotic 100 million cell-10 mg capsule  Take 1 capsule every day by oral route.   Yes [provider]  magnesium oxide (MAG-OX) 400 (240 Mg) MG tablet Take 1 tablet (400 mg total) by mouth 2 (two) times daily. 02/13/21  Yes Arnetha Courser, MD  metFORMIN (GLUCOPHAGE) 1000 MG tablet Take 1,000  mg by mouth 2 (two) times daily. 12/17/20  Yes [provider]  Multiple Vitamin (ONE DAILY) tablet Take 1 tablet by mouth daily.   Yes [provider]  omeprazole (PRILOSEC) 20 MG capsule Take 20 mg by mouth daily. 06/26/20 06/26/21 Yes [provider]  potassium chloride SA (K-DUR,KLOR-CON) 20 MEQ tablet Take 20 mEq by mouth daily. 02/02/14  Yes [provider]  rivaroxaban (XARELTO) 20 MG TABS tablet Take 1 tablet (20 mg total) by mouth daily with supper. 02/13/21  Yes Arnetha Courser, MD  senna-docusate (SENOKOT-S) 8.6-50 MG tablet Senna with Docusate Sodium 8.6 mg-50 mg tablet  Take 2 tablets twice a day by oral route.   Yes [provider]  tamsulosin (FLOMAX) 0.4 MG CAPS capsule Take 0.4 mg by mouth daily.   Yes [provider]  tiotropium (SPIRIVA HANDIHALER) 18 MCG inhalation capsule Spiriva with HandiHaler 18 mcg and inhalation capsules   Yes [provider]  albuterol (VENTOLIN HFA) 108 (90 Base) MCG/ACT inhaler Ventolin HFA 90 mcg/actuation aerosol inhaler  Inhale 2 puffs by mouth every 6 hours as needed for wheezing 10/06/19   [provider]  colchicine 0.6 MG tablet Take 0.6 mg by mouth as needed (gout). 06/13/20 06/13/21  [provider]  guaiFENesin-dextromethorphan (ROBITUSSIN DM) 100-10 MG/5ML syrup Take 10 mLs by mouth every 4 (four) hours as needed for cough. 02/13/21   Arnetha Courser, MD  ipratropium-albuterol (DUONEB) 0.5-2.5 (3) MG/3ML SOLN Inhale into the lungs. 04/16/19 11/10/20  [provider]  Nutritional Supplements (FEEDING SUPPLEMENT, NEPRO CARB STEADY,) LIQD Take 237 mLs by mouth 2 (two) times daily between meals. 02/13/21   Arnetha Courser, MD  polyethylene glycol (MIRALAX / GLYCOLAX) 17 g packet Take 17 g by mouth daily as needed for mild constipation. 02/13/21   Arnetha Courser, MD  TRUETRACK TEST test strip  02/21/14   [provider]    Physical Exam: Vitals:   02/20/21 1830 02/20/21  1845 02/20/21 1900 02/20/21 1915  BP: 128/70  116/64   Pulse: (!) 118 (!) 108 (!) 114 (!) 108  Resp: (!) 30 (!) 35 (!) 26 (!) 28  Temp:      TempSrc:      SpO2: 99% 100% 98% 98%   Constitutional: Lethargic, but arousable and will answer questions appropriately  .  Mild respiratory distress with conversational dyspnea  HEENT:      Head: Normocephalic and atraumatic.         Eyes: PERLA, EOMI, Conjunctivae are normal. Sclera is non-icteric.       Mouth/Throat: Mucous membranes are moist.       Neck: Supple with no signs of meningismus. Cardiovascular: Regular rate and rhythm. No murmurs, gallops, or rubs. 2+ symmetrical distal pulses are present . No JVD. No  LE edema Respiratory:  Respiratory effort increased .Lungs sounds diminished at bases bilaterally with bibasilar wheezes  gastrointestinal: Soft, non tender, non distended. Positive bowel sounds.  Genitourinary: No CVA tenderness. Musculoskeletal: Nontender with normal range of motion in all extremities. No cyanosis, or erythema of extremities. Neurologic:  Face is symmetric. Moving all extremities. No gross focal neurologic deficits . Skin: Skin is warm, dry.  No rash or ulcers Psychiatric: Mood and affect are appropriate    Labs on Admission: I have personally reviewed following labs and imaging studies  CBC: Recent Labs  Lab 02/20/21 1743  WBC 7.7  NEUTROABS 6.6  HGB 10.1*  HCT 32.3*  MCV 99.7  PLT 230   Basic Metabolic Panel: Recent Labs  Lab 02/14/21 0452 02/20/21 1743  NA  --  138  K  --  4.6  CL  --  98  CO2  --  34*  GLUCOSE  --  345*  BUN  --  25*  CREATININE  --  1.38*  CALCIUM  --  8.5*  MG 2.1  --    GFR: CrCl cannot be calculated (Unknown ideal weight.). Liver Function Tests: Recent Labs  Lab 02/20/21 1743  AST 19  ALT 17  ALKPHOS 82  BILITOT 0.5  PROT 6.4*  ALBUMIN 2.6*   No results for input(s): LIPASE, AMYLASE in the last 168 hours. No results for input(s): AMMONIA in the last 168  hours. Coagulation Profile: Recent Labs  Lab 02/20/21 1743  INR 1.4*   Cardiac Enzymes: No results for input(s): CKTOTAL, CKMB, CKMBINDEX, TROPONINI in the last 168 hours. BNP (last 3 results) No results for input(s): PROBNP in the last 8760 hours. HbA1C: No results for input(s): HGBA1C in the last 72 hours. CBG: Recent Labs  Lab 02/14/21 0826 02/14/21 1141 02/20/21 1730  GLUCAP 175* 210* 299*   Lipid Profile: No results for input(s): CHOL, HDL, LDLCALC, TRIG, CHOLHDL, LDLDIRECT in the last 72 hours. Thyroid Function Tests: No results for input(s): TSH, T4TOTAL, FREET4, T3FREE, THYROIDAB in the last 72 hours. Anemia Panel: No results for input(s): VITAMINB12, FOLATE, FERRITIN, TIBC, IRON, RETICCTPCT in the last 72 hours. Urine analysis:    Component Value Date/Time   COLORURINE YELLOW (A) 02/20/2021 1743   APPEARANCEUR CLEAR (A) 02/20/2021 1743   LABSPEC 1.010 02/20/2021 1743   PHURINE 5.0 02/20/2021 1743   GLUCOSEU 150 (A) 02/20/2021 1743   HGBUR NEGATIVE 02/20/2021 1743   BILIRUBINUR NEGATIVE 02/20/2021 1743   KETONESUR NEGATIVE 02/20/2021 1743   PROTEINUR NEGATIVE 02/20/2021 1743   UROBILINOGEN 0.2 12/01/2013 2020   NITRITE NEGATIVE 02/20/2021 1743   LEUKOCYTESUR NEGATIVE 02/20/2021 1743    Radiological Exams on Admission: DG Chest Port 1 View  Result Date: 02/20/2021 CLINICAL DATA:  Question sepsis. EXAM: PORTABLE CHEST 1 VIEW COMPARISON:  02/10/2021 FINDINGS: Interval progression of extensive infiltrate in the right mid and lower lung. Progression of small to moderate right pleural effusion Left perihilar airspace disease is present with mild improvement. No effusion on the left. Negative for heart failure. IMPRESSION: Progressive airspace disease in the right mid and lower lung with progressive right effusion. Possible progressive pneumonia. Underlying mass lesion possible. Consider chest CT with contrast for further evaluation. Left perihilar airspace densities  shows interval improvement. Electronically Signed   By: Marlan Palau M.D.   On: 02/20/2021 17:51    Assessment/Plan    Severe sepsis [facility])   Healthcare associated pneumonia   Acute respiratory failure with hypercapnia  - Sepsis criteria includes tachycardia, tachypnea, elevated lactic acid, AKI,  acute respiratory failure, acute metabolic encephalopathy - Respiratory failure criteria: Patient without O2 dependence at baseline with O2 sat 85% on room air requiring 3 L.  VBG with pH 7.32 and PCO2 of 79.  Patient with mild to moderate respiratory distress and speaking in short sentences - Chest x-ray showing progression and right middle and lower lung airspace disease with progressive right effusion with recommendation for CT chest to evaluate for underlying mass -Cefepime and vancomycin -IV fluid resuscitation per sepsis protocol - Supplemental O2 to keep sats over 92 - Antitussives - Follow blood cultures - Patient has an elevated risk of clinical deterioration    Atrial fibrillation with RVR (HCC) -Likely secondary to acute illness - Continue diltiazem and dofetilide - Continue Xarelto    Acute metabolic encephalopathy - Patient with increased confusion related to sepsis - Fall and aspiration precautions - Neurologic checks    AKI (acute kidney injury) (HCC) - Creatinine 1.38, up from 0.73, related to sepsis - IV fluid resuscitation - Monitor renal function, avoid nephrotoxins and renally dose all meds    Hyperglycemia due to type 2 diabetes mellitus (HCC) - Blood sugar 345 - Sliding scale insulin coverage - Basal insulin    Acute on chronic anemia - Hemoglobin 10.1, down from 12.1 baseline - Serial H&H - Iron studies and stool for occult blood - Continue iron    Chronic diastolic heart failure (HCC) - Not acutely exacerbated - Echo from 10/29 with EF 55 to 60% - Patient not on diuretics or beta-blockers or ACE/ARB    Essential hypertension - Stable.  Continue  Cardizem    COPD (chronic obstructive pulmonary disease) (HCC) - DuoNebs as needed - Continue Spiriva - Continue budesonide    Addendum: Dysphagia Patient failed bedside swallow eval post admission -hold all oral meds  -NPO  -speech therapy eval in the am -lovenox to replace Xarelto  DVT prophylaxis: Xarelto  code Status: DNR Family Communication:  none  Disposition Plan: Back to previous home environment Consults called: none  Status:At the time of admission, it appears that the appropriate admission status for this patient is INPATIENT. This is judged to be reasonable and necessary in order to provide the required intensity of service to ensure the patient's safety given the presenting symptoms, physical exam findings, and initial radiographic and laboratory data in the context of their  Comorbid conditions.   Patient requires inpatient status due to high intensity of service, high risk for further deterioration and high frequency of surveillance required.   I certify that at the point of admission it is my clinical judgment that the patient will require inpatient hospital care spanning beyond 2 midnights    Andris Baumann MD [facility]   02/20/2021, 7:39 PM

## 2021-02-20 NOTE — Sepsis Progress Note (Signed)
Notified bedside nurse of need to draw repeat lactic acid. 

## 2021-02-20 NOTE — ED Notes (Signed)
Pt. Completed entire azithromycin infusion, 500mg  in . This RN attempted to fix [facility] "incomplete" charting for azithromycin, but unable to.

## 2021-02-20 NOTE — Consult Note (Signed)
CODE SEPSIS - PHARMACY COMMUNICATION  **Broad Spectrum Antibiotics should be administered within 1 hour of Sepsis diagnosis**  Time Code Sepsis Called/Page Received: 1739  Antibiotics Ordered: Azithromycin, Rocephin  Time of 1st antibiotic administration: 1806  Additional action taken by pharmacy: none  If necessary, Name of Provider/Nurse Contacted: n/a    Bettey Costa ,PharmD Clinical Pharmacist  02/20/2021  6:13 PM

## 2021-02-21 ENCOUNTER — Inpatient Hospital Stay: Payer: Medicare Other

## 2021-02-21 ENCOUNTER — Encounter: Payer: Self-pay | Admitting: Internal Medicine

## 2021-02-21 DIAGNOSIS — A419 Sepsis, unspecified organism: Secondary | ICD-10-CM | POA: Diagnosis not present

## 2021-02-21 DIAGNOSIS — R6520 Severe sepsis without septic shock: Secondary | ICD-10-CM | POA: Diagnosis not present

## 2021-02-21 LAB — LACTATE DEHYDROGENASE: LDH: 91 U/L — ABNORMAL LOW (ref 98–192)

## 2021-02-21 LAB — RESPIRATORY PANEL BY PCR

## 2021-02-21 LAB — CBC
HCT: 27.2 % — ABNORMAL LOW (ref 39.0–52.0)
Hemoglobin: 8.2 g/dL — ABNORMAL LOW (ref 13.0–17.0)
MCH: 29.9 pg (ref 26.0–34.0)
MCHC: 30.1 g/dL (ref 30.0–36.0)
MCV: 99.3 fL (ref 80.0–100.0)
Platelets: 195 10*3/uL (ref 150–400)
RBC: 2.74 MIL/uL — ABNORMAL LOW (ref 4.22–5.81)
RDW: 13.3 % (ref 11.5–15.5)
WBC: 5.5 10*3/uL (ref 4.0–10.5)
nRBC: 0 % (ref 0.0–0.2)

## 2021-02-21 LAB — STREP PNEUMONIAE URINARY ANTIGEN: Strep Pneumo Urinary Antigen: POSITIVE — AB

## 2021-02-21 LAB — BODY FLUID CELL COUNT WITH DIFFERENTIAL
Eos, Fluid: 0 %
Lymphs, Fluid: 71 %
Monocyte-Macrophage-Serous Fluid: 18 %
Neutrophil Count, Fluid: 11 %
Other Cells, Fluid: 0 %
Total Nucleated Cell Count, Fluid: 813 cu mm

## 2021-02-21 LAB — BASIC METABOLIC PANEL
Anion gap: 8 (ref 5–15)
BUN: 20 mg/dL (ref 8–23)
CO2: 29 mmol/L (ref 22–32)
Calcium: 8.1 mg/dL — ABNORMAL LOW (ref 8.9–10.3)
Chloride: 104 mmol/L (ref 98–111)
Creatinine, Ser: 1.16 mg/dL (ref 0.61–1.24)
GFR, Estimated: >60 mL/min (ref >60)
Glucose, Bld: 155 mg/dL — ABNORMAL HIGH (ref 70–99)
Potassium: 4.6 mmol/L (ref 3.5–5.1)
Sodium: 141 mmol/L (ref 135–145)

## 2021-02-21 LAB — HIV ANTIBODY (ROUTINE TESTING W REFLEX): HIV Screen 4th Generation wRfx: NONREACTIVE

## 2021-02-21 LAB — PROTEIN, PLEURAL OR PERITONEAL FLUID: Total protein, fluid: 3.1 g/dL

## 2021-02-21 LAB — CBG MONITORING, ED
Glucose-Capillary: 140 mg/dL — ABNORMAL HIGH (ref 70–99)
Glucose-Capillary: 177 mg/dL — ABNORMAL HIGH (ref 70–99)
Glucose-Capillary: 267 mg/dL — ABNORMAL HIGH (ref 70–99)

## 2021-02-21 LAB — PROCALCITONIN: Procalcitonin: <0.1 ng/mL

## 2021-02-21 LAB — CORTISOL-AM, BLOOD: Cortisol - AM: 16.7 ug/dL (ref 6.7–22.6)

## 2021-02-21 LAB — GLUCOSE, CAPILLARY
Glucose-Capillary: 172 mg/dL — ABNORMAL HIGH (ref 70–99)
Glucose-Capillary: 270 mg/dL — ABNORMAL HIGH (ref 70–99)
Glucose-Capillary: 350 mg/dL — ABNORMAL HIGH (ref 70–99)

## 2021-02-21 LAB — URINE CULTURE: Culture: NO GROWTH

## 2021-02-21 LAB — GLUCOSE, PLEURAL OR PERITONEAL FLUID: Glucose, Fluid: 270 mg/dL

## 2021-02-21 LAB — PROTEIN, TOTAL: Total Protein: 6 g/dL — ABNORMAL LOW (ref 6.5–8.1)

## 2021-02-21 LAB — LACTATE DEHYDROGENASE, PLEURAL OR PERITONEAL FLUID: LD, Fluid: 61 U/L — ABNORMAL HIGH (ref 3–23)

## 2021-02-21 LAB — PROTIME-INR
INR: 1.3 — ABNORMAL HIGH (ref 0.8–1.2)
Prothrombin Time: 15.8 seconds — ABNORMAL HIGH (ref 11.4–15.2)

## 2021-02-21 LAB — MRSA NEXT GEN BY PCR, NASAL: MRSA by PCR Next Gen: NOT DETECTED

## 2021-02-21 MED ORDER — INSULIN GLARGINE-YFGN 100 UNIT/ML ~~LOC~~ SOLN
10.0000 [IU] | Freq: Every day | SUBCUTANEOUS | Status: DC
  Administered 2021-02-21: 10 [IU] via SUBCUTANEOUS
  Filled 2021-02-21 (×2): qty 0.1

## 2021-02-21 MED ORDER — DILTIAZEM HCL ER COATED BEADS 120 MG PO CP24
240.0000 mg | ORAL_CAPSULE | Freq: Every day | ORAL | Status: DC
  Administered 2021-02-21 – 2021-02-26 (×6): 240 mg via ORAL
  Filled 2021-02-21 (×3): qty 2
  Filled 2021-02-21: qty 1
  Filled 2021-02-21 (×3): qty 2

## 2021-02-21 MED ORDER — PIPERACILLIN-TAZOBACTAM 3.375 G IVPB
3.3750 g | Freq: Three times a day (TID) | INTRAVENOUS | Status: DC
  Administered 2021-02-21 – 2021-02-24 (×9): 3.375 g via INTRAVENOUS
  Filled 2021-02-21 (×9): qty 50

## 2021-02-21 MED ORDER — RIVAROXABAN 15 MG PO TABS
15.0000 mg | ORAL_TABLET | Freq: Every day | ORAL | Status: DC
  Filled 2021-02-21: qty 1

## 2021-02-21 MED ORDER — RIVAROXABAN 20 MG PO TABS
20.0000 mg | ORAL_TABLET | Freq: Every day | ORAL | Status: DC
  Administered 2021-02-21 – 2021-02-26 (×6): 20 mg via ORAL
  Filled 2021-02-21 (×6): qty 1

## 2021-02-21 MED ORDER — SENNOSIDES-DOCUSATE SODIUM 8.6-50 MG PO TABS
2.0000 | ORAL_TABLET | Freq: Every day | ORAL | Status: DC
  Administered 2021-02-21 – 2021-02-24 (×4): 2 via ORAL
  Filled 2021-02-21 (×5): qty 2

## 2021-02-21 MED ORDER — METHYLPREDNISOLONE SODIUM SUCC 125 MG IJ SOLR
120.0000 mg | INTRAMUSCULAR | Status: DC
  Administered 2021-02-21 – 2021-02-22 (×2): 120 mg via INTRAVENOUS
  Filled 2021-02-21 (×2): qty 2

## 2021-02-21 MED ORDER — METRONIDAZOLE 500 MG/100ML IV SOLN
500.0000 mg | Freq: Two times a day (BID) | INTRAVENOUS | Status: DC
  Filled 2021-02-21 (×2): qty 100

## 2021-02-21 MED ORDER — IOHEXOL 300 MG/ML  SOLN
75.0000 mL | Freq: Once | INTRAMUSCULAR | Status: AC | PRN
  Administered 2021-02-21: 75 mL via INTRAVENOUS

## 2021-02-21 NOTE — Consult Note (Signed)
Pharmacy Antibiotic Note  Samuel Thomas is a 85 y.o. male admitted on 02/20/2021 with pneumonia.  Pharmacy has been consulted for Vancomycin and Cefepime dosing. Of note, patient has stated allergy to PCN but received Rocephin in ED with no complications.  Plan: Vancomycin loading dose of 1750mg  x1 given in ED. Per D/w MD, based on MRSA PCR negative and no h/o MDRO but recent PO abx. Will d/c Vanc and consolidate CFP/MTZ to Zosyn. CTM culture data.  Initiate Zosyn 3.375g q8h  (listed PCN allergy but has tolerated zosyn in the past & CTX/CFP on this admission)  Height: 5\' 7"  (170.2 cm) Weight: 81 kg (178 lb 9.2 oz) IBW/kg (Calculated) : 66.1  Temp (24hrs), Avg:98 F (36.7 C), Min:97.6 F (36.4 C), Max:98.3 F (36.8 C)  Recent Labs  Lab 02/20/21 1743 02/20/21 2241 02/21/21 0621 02/21/21 0746  WBC 7.7  --  5.5  --   CREATININE 1.38*  --   --  1.16  LATICACIDVEN 2.1* 1.5  --   --      Estimated Creatinine Clearance: 49.2 mL/min (by C-G formula based on SCr of 1.16 mg/dL).    Allergies  Allergen Reactions   Penicillins Swelling and Rash    Break outs swelling Other reaction(s): SWELLING in 1955   Apixaban Other (See Comments)    Cold intolerance Cold intolerance    Prednisone Rash and Other (See Comments)    Makes mean  Significant mood and behavioral changes - 'felt like I wasn't in control of anything'    Antimicrobials this admission: Rocephin x1 (11/10) VAN/CFP/MTZ  (11/10-11/11) Zosyn (11/11>>  Microbiology results: 11/11 ext'd Resp viral panel: pending 11/11 sputum cx: pending 11/10 BCx: NG12h 11/10 UCx: sent/pending  11/10 MRSA PCR: negative 11/10 COV/FLU: negative  Thank you for allowing pharmacy to be a part of this patient's care.  13/10 Collins Dimaria 02/21/2021 1:27 PM

## 2021-02-21 NOTE — ED Notes (Signed)
Jacque RN aware of assigned bed ?

## 2021-02-21 NOTE — Evaluation (Signed)
Clinical/Bedside Swallow Evaluation Patient Details  Name: Samuel Thomas MRN: [id number] Date of Birth: 12-24-1935  Today's Date: 02/21/2021 Time: SLP Start Time (ACUTE ONLY): 0855 SLP Stop Time (ACUTE ONLY): 0955 SLP Time Calculation (min) (ACUTE ONLY): 60 min  Past Medical History:  Past Medical History:  Diagnosis Date   COPD (chronic obstructive pulmonary disease) (HCC)    DM2 (diabetes mellitus, type 2) (HCC)    HLD (hyperlipidemia)    HTN (hypertension)    Past Surgical History:  Past Surgical History:  Procedure Laterality Date   HERNIA REPAIR     HPI:  Pt is a 85 y.o. male with medical history significant for DM, HTN, diastolic heart failure, paroxysmal A. fib on Xarelto and Tikosyn, thyroid nodules, iron deficiency anemia, frequent falls, COPD, hearing impairment chronic opioid use, recently hospitalized for pneumonia from 10/27-11/4 and discharged to rehab , who presents to the ED from rehab due to blood sugar in the 500s for several days and altered mental status described as increased confusion.    CT of Chest: "Large right pleural effusion. There is decreased aeration in the  right lung, possibly compression atelectasis and pneumonia. There is  small 15 mm loculated fluid collection with possible small pocket of  air in the inferior aspect of right lower lobe. This may suggest  incidental bulla with fluid or infectious process with possible  small lung abscess. As far as seen, there are no central obstructing  lesions in the right hilum. There are slightly enlarged lymph nodes  in the mediastinum, possibly suggesting reactive hyperplasia of  lymph nodes.  Small left pleural effusion. There are small patchy infiltrates in  the posterior segment of left upper lobe suggesting  atelectasis/pneumonia.     Extensive coronary artery calcifications are seen.".  CT of Head: "No acute intracranial abnormality.  2. Stable atrophy, chronic microvascular ischemic white matter  disease and  bilateral punctate thalamic lacunar infarcts.".    Assessment / Plan / Recommendation  Clinical Impression  Pt appears to present w/ pharyngeal phase dysphagia c/b overt coughing w/ trial of thin liquids during this exam in conjunction w/ reports of difficulty swallowing liquids at home prior, "for ~1+ years".  Pt does not have overt Neurological history (ie, CVA) but did not Head CT results.  Pt consumed po trials of a Modified diet consistency w/ No overt, clinical s/s of aspiration during the po trials.  Pt appears at reduced risk for aspiration following aspiration precautions and using a Modified diet including Nectar consistency liquids.   During po trials, pt consumed trial of thin liquid via Cup w/ immediate coughing; Baseline reports of such at home. Pt's current Chest CT Imaging indicates entensive R lobe involvement concerning for pneumonia and pleural effusion w/ decreased aeration of the lung. W/ modified consistencies(Nectar liquids), no overt coughing, decline in vocal quality, or change in respiratory presentation during/post trials. Oral phase appeared grossly [facility] w/ timely bolus management, mastication, and control of bolus propulsion for A-P transfer for swallowing. Oral clearing achieved w/ all trial consistencies. OM Exam appeared [facility] w/ no unilateral weakness noted. Speech Clear. Pt fed self w/ setup support.       Recommend a Mech Soft consistency diet w/ well-Cut meats, moistened foods; Nectar liquids VIA CUP - pt does not use straws at home. Recommend aspiration precautions, Pills WHOLE vs Crushed in Puree for safer, easier swallowing as pt described Larger pills cause difficulty swallowing. Education given on Pills in Puree; food consistencies and easy to  eat options; general aspiration precautions; f/u w/ MBSS next week for objective assessment of swallowing. NSG and MD updated on above. SLP Visit Diagnosis: Dysphagia, pharyngeal phase (R13.13)    Aspiration Risk  Mild  aspiration risk;Moderate aspiration risk;Risk for inadequate nutrition/hydration    Diet Recommendation   Mech Soft consistency diet w/ well-Cut meats, moistened foods; Nectar liquids VIA CUP - pt does not use straws at home. Recommend aspiration precautions. Tray setup as needed; positioning.  Medication Administration: Whole meds with puree (vs need to Crush in puree)    Other  Recommendations Recommended Consults:  (Dietician f/u) Oral Care Recommendations: Oral care BID;Oral care before and after PO;Staff/trained caregiver to provide oral care Other Recommendations: Order thickener from pharmacy;Prohibited food (jello, ice cream, thin soups);Remove water pitcher;Have oral suction available    Recommendations for follow up therapy are one component of a multi-disciplinary discharge planning process, led by the attending physician.  Recommendations may be updated based on patient status, additional functional criteria and insurance authorization.  Follow up Recommendations Skilled nursing-short term rehab (<3 hours/day) (TBD)      Assistance Recommended at Discharge Intermittent Supervision/Assistance  Functional Status Assessment Patient has had a recent decline in their functional status and/or demonstrates limited ability to make significant improvements in function in a reasonable and predictable amount of time  Frequency and Duration min 3x week  2 weeks       Prognosis Prognosis for Safe Diet Advancement: Fair Barriers to Reach Goals: Time post onset;Severity of deficits      Swallow Study   General Date of Onset: 02/20/21 HPI: Pt is a 85 y.o. male with medical history significant for DM, HTN, diastolic heart failure, paroxysmal A. fib on Xarelto and Tikosyn, thyroid nodules, iron deficiency anemia, frequent falls, COPD, hearing impairment chronic opioid use, recently hospitalized for pneumonia from 10/27-11/4 and discharged to rehab , who presents to the ED from rehab due to  blood sugar in the 500s for several days and altered mental status described as increased confusion.   CT of Chest: "Large right pleural effusion. There is decreased aeration in the  right lung, possibly compression atelectasis and pneumonia. There is  small 15 mm loculated fluid collection with possible small pocket of  air in the inferior aspect of right lower lobe. This may suggest  incidental bulla with fluid or infectious process with possible  small lung abscess. As far as seen, there are no central obstructing  lesions in the right hilum. There are slightly enlarged lymph nodes  in the mediastinum, possibly suggesting reactive hyperplasia of  lymph nodes.  Small left pleural effusion. There are small patchy infiltrates in  the posterior segment of left upper lobe suggesting  atelectasis/pneumonia.     Extensive coronary artery calcifications are seen.". CT of Head: "No acute intracranial abnormality.  2. Stable atrophy, chronic microvascular ischemic white matter  disease and bilateral punctate thalamic lacunar infarcts.". Type of Study: Bedside Swallow Evaluation Previous Swallow Assessment: none Diet Prior to this Study: NPO (regular diet at home per pt) Temperature Spikes Noted: No (wbc 5.5) Respiratory Status: Nasal cannula (2L) History of Recent Intubation: No Behavior/Cognition: Alert;Cooperative;Pleasant mood;Distractible;Requires cueing (min) Oral Cavity Assessment: Dry Oral Care Completed by SLP: Yes Oral Cavity - Dentition: Missing dentition;Poor condition (few) Vision: Functional for self-feeding Self-Feeding Abilities: Able to feed self;Needs assist;Needs set up Patient Positioning: Upright in bed (needed positioning) Baseline Vocal Quality: Normal Volitional Cough: Strong Volitional Swallow: Able to elicit    Oral/Motor/Sensory Function Overall Oral  Motor/Sensory Function: Within functional limits   Ice Chips Ice chips: Within functional limits Presentation: Spoon (fed; 3  trials)   Thin Liquid Thin Liquid: Impaired Presentation: Cup;Self Fed (supported; x1) Pharyngeal  Phase Impairments: Cough - Immediate Other Comments: pt reported a Baseline of difficulty swallowing thin liquids at home -- "get choked"    Nectar Thick Nectar Thick Liquid: Within functional limits Presentation: Cup;Self Fed (4 ozs)   Honey Thick Honey Thick Liquid: Not tested   Puree Puree: Within functional limits Presentation: Spoon;Self Fed (supported; ~3 ozs)   Solid     Solid: Within functional limits (adequate w/ moistened foods) Presentation: Self Fed;Spoon (supported; 8 trials)       Jerilynn Som, MS, CCC-SLP Speech Language Pathologist [facility] [phone] Shatika Grinnell 02/21/2021,3:49 PM

## 2021-02-21 NOTE — Progress Notes (Addendum)
PROGRESS NOTE    THEORY IMIG  ELF:[id number] DOB: [birth year] DOA: 02/20/2021 PCP: Jenell Milliner, MD  Outpatient Specialists: cardiology    Brief Narrative:   From admission hpi Samuel Thomas is a 85 y.o. male with medical history significant for DM, HTN, diastolic heart failure, paroxysmal A. fib on Xarelto and Tikosyn, thyroid nodules, iron deficiency anemia, frequent falls, COPD, hearing impairment chronic opioid use, recently hospitalized for pneumonia from 10/27-11/4 and discharged to rehab , who presents to the ED from rehab due to blood sugar in the 500s for several days and altered mental status described as increased confusion.  History obtained mostly from ER provider and ER documentation due to patient's confusion.  O2 sat 85% on room air   Assessment & Plan:   Active Problems:   Chronic diastolic heart failure (HCC)   Essential hypertension   AKI (acute kidney injury) (HCC)   Chronic, continuous use of opioids   COPD (chronic obstructive pulmonary disease) (HCC)   Atrial fibrillation with RVR (HCC)   Polypharmacy   HCAP (healthcare-associated pneumonia)   Hyperglycemia due to type 2 diabetes mellitus (HCC)   Acute on chronic anemia   Acute respiratory failure with hypercapnia (HCC)   Acute metabolic encephalopathy   Severe sepsis (HCC)  # Healthcare associated pneumonia # Sepsis # Pleural effusion # COPD Sepsis criteria by tachycardia, tachypnea, elevated lactic acid, encephalopathy. Hypoxic on arrival. CXR with R lung disease with effusion, malignancy not excluded. Treated with cephalosporin and azithromycin for CAP earlier this month. S/p gluid resuscitation. Currently hemodynamically stable. Covid/flu neg - cont vanc/cefepime, add flagyl - CT chest w/ contrast when kidney function allows - possible thoracentesis/drain - methylpred - condition guarded - f/u cultures - sputum for culture if able to obtain - legionella and streptocococcal antigens,  respiratory panel, f/u hiv  # Acute metabolic encephalopathy Likely 2/2 infection, hypoxia - treating as above - f/u CT head - hold home gabapentin  # Acute hypoxic respiratory failure O2 in mid 80s on arrival, normalized on 3 L - [place] O2, wean as able  # A fib with RVR RVR resolved - cont home dilt, tikosyn - xarelto  # Failed swallow screen - SLP to eval  # Acute kidney injury Cr 1.38 from baseline 0.9, likely 2/2 sepsis. S/p fluid resuscitation - f/u repeat labs  # Anemia Iron deficiency diagnosed recent hospitalization. Cbc 8.2 this morning from 10 prior. No report of rectal or other bleeding. Possibly dilutional - monitor closely  # T2DM Initial hyperglycemia resolved - standing lantus; SSI  # HFpEF Here w/ septic picture, also edematous, with effusions, for now treating as pneumonia/sepsis but may need diuresis  # HTN Here bp mildly elevated - cont home diltiazem, atorvastatin  # BPH Home proscar, flomax   DVT prophylaxis: xarelto Code Status: DNR Family Communication: grandson and poa david updated 11/11  Level of care: Progressive Status is: Inpatient  Remains inpatient appropriate because: severity of illness        Consultants:  none  Procedures: none  Antimicrobials:  Vanc/cefepime/flagyl    Subjective: Awake and alert, non-sensical answers  Objective: Vitals:   02/21/21 0630 02/21/21 0650 02/21/21 0700 02/21/21 0750  BP: 120/64  (!) 137/53 138/70  Pulse: (!) 54 64 85 (!) 110  Resp: 16 17 19  (!) 29  Temp:      TempSrc:      SpO2: 97% 96% 98% 97%  Weight:        Intake/Output Summary (Last 24 hours)  at 02/21/2021 0802 Last data filed at 02/21/2021 0133 Gross per 24 hour  Intake 2296.8 ml  Output 400 ml  Net 1896.8 ml   Filed Weights   02/20/21 2000  Weight: 80.4 kg    Examination:  General exam: Appears calm and comfortable, confused Respiratory system: rales and decreased breaths sounds right base, mild  tachypnea Cardiovascular system: irreg irreg, mild systolic murmur, 2+ LE edema Gastrointestinal system: Abdomen is nondistended, soft and nontender. No organomegaly or masses felt. Normal bowel sounds heard. Central nervous system: awake and alert, moves all 4 extremities Extremities: b/l le edema Skin: hyperpigmentation lower calves Psychiatry: calm, confused    Data Reviewed: I have personally reviewed following labs and imaging studies  CBC: Recent Labs  Lab 02/20/21 1743 02/21/21 0621  WBC 7.7 5.5  NEUTROABS 6.6  --   HGB 10.1* 8.2*  HCT 32.3* 27.2*  MCV 99.7 99.3  PLT 230 195   Basic Metabolic Panel: Recent Labs  Lab 02/20/21 1743  NA 138  K 4.6  CL 98  CO2 34*  GLUCOSE 345*  BUN 25*  CREATININE 1.38*  CALCIUM 8.5*   GFR: Estimated Creatinine Clearance: 41.2 mL/min (A) (by C-G formula based on SCr of 1.38 mg/dL (H)). Liver Function Tests: Recent Labs  Lab 02/20/21 1743  AST 19  ALT 17  ALKPHOS 82  BILITOT 0.5  PROT 6.4*  ALBUMIN 2.6*   No results for input(s): LIPASE, AMYLASE in the last 168 hours. No results for input(s): AMMONIA in the last 168 hours. Coagulation Profile: Recent Labs  Lab 02/20/21 1743 02/21/21 0621  INR 1.4* 1.3*   Cardiac Enzymes: No results for input(s): CKTOTAL, CKMB, CKMBINDEX, TROPONINI in the last 168 hours. BNP (last 3 results) No results for input(s): PROBNP in the last 8760 hours. HbA1C: No results for input(s): HGBA1C in the last 72 hours. CBG: Recent Labs  Lab 02/20/21 1730 02/20/21 2228 02/21/21 0005 02/21/21 0416 02/21/21 0749  GLUCAP 299* 263* 267* 177* 140*   Lipid Profile: No results for input(s): CHOL, HDL, LDLCALC, TRIG, CHOLHDL, LDLDIRECT in the last 72 hours. Thyroid Function Tests: No results for input(s): TSH, T4TOTAL, FREET4, T3FREE, THYROIDAB in the last 72 hours. Anemia Panel: No results for input(s): VITAMINB12, FOLATE, FERRITIN, TIBC, IRON, RETICCTPCT in the last 72 hours. Urine  analysis:    Component Value Date/Time   COLORURINE YELLOW (A) 02/20/2021 1743   APPEARANCEUR CLEAR (A) 02/20/2021 1743   LABSPEC 1.010 02/20/2021 1743   PHURINE 5.0 02/20/2021 1743   GLUCOSEU 150 (A) 02/20/2021 1743   HGBUR NEGATIVE 02/20/2021 1743   BILIRUBINUR NEGATIVE 02/20/2021 1743   KETONESUR NEGATIVE 02/20/2021 1743   PROTEINUR NEGATIVE 02/20/2021 1743   UROBILINOGEN 0.2 12/01/2013 2020   NITRITE NEGATIVE 02/20/2021 1743   LEUKOCYTESUR NEGATIVE 02/20/2021 1743   Sepsis Labs: @LABRCNTIP (procalcitonin:4,lacticidven:4)  ) Recent Results (from the past 240 hour(s))  SARS CORONAVIRUS 2 (TAT 6-24 HRS) Nasopharyngeal Nasopharyngeal Swab     Status: None   Collection Time: 02/12/21  1:08 PM   Specimen: Nasopharyngeal Swab  Result Value Ref Range Status   SARS Coronavirus 2 NEGATIVE NEGATIVE Final    Comment: (NOTE) SARS-CoV-2 target nucleic acids are NOT DETECTED.  The SARS-CoV-2 RNA is generally detectable in upper and lower respiratory specimens during the acute phase of infection. Negative results do not preclude SARS-CoV-2 infection, do not rule out co-infections with other pathogens, and should not be used as the sole basis for treatment or other patient management decisions. Negative results must  be combined with clinical observations, patient history, and epidemiological information. The expected result is Negative.  Fact Sheet for Patients: HairSlick.no  Fact Sheet for Healthcare Providers: quierodirigir.com  This test is not yet approved or cleared by the [place] FDA and  has been authorized for detection and/or diagnosis of SARS-CoV-2 by FDA under an Emergency Use Authorization (EUA). This EUA will remain  in effect (meaning this test can be used) for the duration of the COVID-19 declaration under Se ction 564(b)(1) of the Act, 21 U.S.C. section 360bbb-3(b)(1), unless the authorization is terminated  or revoked sooner.  Performed at [facility], [address], [place], [place] [address]   Blood Culture (routine x 2)     Status: None (Preliminary result)   Collection Time: 02/20/21  5:33 PM   Specimen: BLOOD  Result Value Ref Range Status   Specimen Description BLOOD LEFT ANTECUBITAL  Final   Special Requests   Final    BOTTLES DRAWN AEROBIC AND ANAEROBIC Blood Culture adequate volume   Culture   Final    NO GROWTH < 12 HOURS Performed at [facility], [address], [place], [place] [address]    Report Status PENDING  Incomplete  Resp Panel by RT-PCR (Flu A&B, Covid) Nasopharyngeal Swab     Status: None   Collection Time: 02/20/21  5:43 PM   Specimen: Nasopharyngeal Swab; Nasopharyngeal(NP) swabs in vial transport medium  Result Value Ref Range Status   SARS Coronavirus 2 by RT PCR NEGATIVE NEGATIVE Final    Comment: (NOTE) SARS-CoV-2 target nucleic acids are NOT DETECTED.  The SARS-CoV-2 RNA is generally detectable in upper respiratory specimens during the acute phase of infection. The lowest concentration of SARS-CoV-2 viral copies this assay can detect is 138 copies/mL. A negative result does not preclude SARS-Cov-2 infection and should not be used as the sole basis for treatment or other patient management decisions. A negative result may occur with  improper specimen collection/handling, submission of specimen other than nasopharyngeal swab, presence of viral mutation(s) within the areas targeted by this assay, and inadequate number of viral copies(<138 copies/mL). A negative result must be combined with clinical observations, patient history, and epidemiological information. The expected result is Negative.  Fact Sheet for Patients:  BloggerCourse.com  Fact Sheet for Healthcare Providers:  SeriousBroker.it  This test is no t yet approved or cleared by the [place] FDA and  has been  authorized for detection and/or diagnosis of SARS-CoV-2 by FDA under an Emergency Use Authorization (EUA). This EUA will remain  in effect (meaning this test can be used) for the duration of the COVID-19 declaration under Section 564(b)(1) of the Act, 21 U.S.C.section 360bbb-3(b)(1), unless the authorization is terminated  or revoked sooner.       Influenza A by PCR NEGATIVE NEGATIVE Final   Influenza B by PCR NEGATIVE NEGATIVE Final    Comment: (NOTE) The Xpert Xpress SARS-CoV-2/FLU/RSV plus assay is intended as an aid in the diagnosis of influenza from Nasopharyngeal swab specimens and should not be used as a sole basis for treatment. Nasal washings and aspirates are unacceptable for Xpert Xpress SARS-CoV-2/FLU/RSV testing.  Fact Sheet for Patients: BloggerCourse.com  Fact Sheet for Healthcare Providers: SeriousBroker.it  This test is not yet approved or cleared by the [place] FDA and has been authorized for detection and/or diagnosis of SARS-CoV-2 by FDA under an Emergency Use Authorization (EUA). This EUA will remain in effect (meaning this test can be used) for the duration  of the COVID-19 declaration under Section 564(b)(1) of the Act, 21 U.S.C. section 360bbb-3(b)(1), unless the authorization is terminated or revoked.  Performed at [facility], [address] [place], [place] [address]   Blood Culture (routine x 2)     Status: None (Preliminary result)   Collection Time: 02/20/21  5:53 PM   Specimen: BLOOD  Result Value Ref Range Status   Specimen Description BLOOD BLOOD LEFT FOREARM  Final   Special Requests   Final    BOTTLES DRAWN AEROBIC AND ANAEROBIC Blood Culture adequate volume   Culture   Final    NO GROWTH < 12 HOURS Performed at [facility], [address] [place], [place] [address]    Report Status PENDING  Incomplete  MRSA Next Gen by PCR, Nasal     Status: None    Collection Time: 02/20/21 10:41 PM   Specimen: Nasal Mucosa; Nasal Swab  Result Value Ref Range Status   MRSA by PCR Next Gen NOT DETECTED NOT DETECTED Final    Comment: (NOTE) The GeneXpert MRSA Assay (FDA approved for NASAL specimens only), is one component of a comprehensive MRSA colonization surveillance program. It is not intended to diagnose MRSA infection nor to guide or monitor treatment for MRSA infections. Test performance is not FDA approved in patients less than 31 years old. Performed at [facility], [address], [place], [place] [address]          Radiology Studies: [facility] Chest [place] 1 View  Result Date: 02/20/2021 CLINICAL DATA:  Question sepsis. EXAM: PORTABLE CHEST 1 VIEW COMPARISON:  02/10/2021 FINDINGS: Interval progression of extensive infiltrate in the right mid and lower lung. Progression of small to moderate right pleural effusion Left perihilar airspace disease is present with mild improvement. No effusion on the left. Negative for heart failure. IMPRESSION: Progressive airspace disease in the right mid and lower lung with progressive right effusion. Possible progressive pneumonia. Underlying mass lesion possible. Consider chest CT with contrast for further evaluation. Left perihilar airspace densities shows interval improvement. Electronically Signed   By: Marlan Palau M.D.   On: 02/20/2021 17:51        Scheduled Meds:  atorvastatin  20 mg Oral QHS   budesonide  0.5 mg Inhalation Daily   dofetilide  125 mcg Oral BID   finasteride  5 mg Oral Daily   folic acid  1 mg Oral Daily   insulin aspart  0-15 Units Subcutaneous Q4H   pantoprazole  40 mg Oral Daily   potassium chloride SA  20 mEq Oral Daily   rivaroxaban  15 mg Oral Q supper   tamsulosin  0.4 mg Oral Daily   tiotropium  18 mcg Inhalation Daily   Continuous Infusions:  sodium chloride 125 mL/hr at 02/21/21 0009   ceFEPime (MAXIPIME) IV Stopped (02/20/21 2253)     LOS: 1 day     Time spent: 45 min    Silvano Bilis, MD [facility]   If 7PM-7AM, please contact night-coverage [email or web address] Password [email or web address] 02/21/2021, 8:02 AM

## 2021-02-21 NOTE — Procedures (Signed)
PROCEDURE SUMMARY:  Successful US guided right thoracentesis. Yielded 2 L of amber-colored fluid. Pt tolerated procedure well. No immediate complications.  Specimen sent for labs. CXR ordered.  EBL < 2 mL  Mickie Kay, NP 02/21/2021 4:44 PM

## 2021-02-21 NOTE — ED Notes (Signed)
Lab called with concerns of lab values after this morning lab draw. Lab asked to please recollect for comparison.

## 2021-02-21 NOTE — Progress Notes (Signed)
Per CCMD, patient's currents QTC is 350. Tikosyn to be administered. See MAR.

## 2021-02-21 NOTE — Progress Notes (Signed)
Inpatient Diabetes Program Recommendations  AACE/ADA: New Consensus Statement on Inpatient Glycemic Control (2015)  Target Ranges:  Prepandial:   less than 140 mg/dL      Peak postprandial:   less than 180 mg/dL (1-2 hours)      Critically ill patients:  140 - 180 mg/dL   Lab Results  Component Value Date   GLUCAP 140 (H) 02/21/2021   HGBA1C 7.1 (H) 02/06/2021    Review of Glycemic Control Results for LEVORN, OLESKI (MRN [id number]) as of 02/21/2021 08:43  Ref. Range 02/20/2021 17:30 02/20/2021 22:28 02/21/2021 00:05 02/21/2021 04:16 02/21/2021 07:49  Glucose-Capillary Latest Ref Range: 70 - 99 mg/dL 517 (H) 001 (H) 749 (H) 177 (H) 140 (H)   Diabetes history: DM 2 Outpatient Diabetes medications: Humalog 6 units tid with meals, Lantus 10 units daily, Metformin 1000 mg bid Current orders for Inpatient glycemic control: Lantus 10 units daily, Novolog moderate q 4 hours.    Inpatient Diabetes Program Recommendations:   Agree with orders.  It appears patient was on both Lantus and Humalog at rehab.  May need adjustments.   Thanks Beryl Meager, RN, BC-ADM Inpatient Diabetes Coordinator Pager [phone]  (8a-5p)

## 2021-02-21 NOTE — ED Notes (Signed)
Pt. Failed swallow screen, Dr. Para March notified.

## 2021-02-21 NOTE — ED Notes (Signed)
CBG 140  

## 2021-02-22 DIAGNOSIS — G9341 Metabolic encephalopathy: Secondary | ICD-10-CM

## 2021-02-22 LAB — CBC
HCT: 28.8 % — ABNORMAL LOW (ref 39.0–52.0)
Hemoglobin: 9.1 g/dL — ABNORMAL LOW (ref 13.0–17.0)
MCH: 30.1 pg (ref 26.0–34.0)
MCHC: 31.6 g/dL (ref 30.0–36.0)
MCV: 95.4 fL (ref 80.0–100.0)
Platelets: 238 10*3/uL (ref 150–400)
RBC: 3.02 MIL/uL — ABNORMAL LOW (ref 4.22–5.81)
RDW: 13.2 % (ref 11.5–15.5)
WBC: 6 10*3/uL (ref 4.0–10.5)
nRBC: 0 % (ref 0.0–0.2)

## 2021-02-22 LAB — BASIC METABOLIC PANEL
Anion gap: 7 (ref 5–15)
BUN: 20 mg/dL (ref 8–23)
CO2: 31 mmol/L (ref 22–32)
Calcium: 8.5 mg/dL — ABNORMAL LOW (ref 8.9–10.3)
Chloride: 104 mmol/L (ref 98–111)
Creatinine, Ser: 1.12 mg/dL (ref 0.61–1.24)
GFR, Estimated: >60 mL/min (ref >60)
Glucose, Bld: 178 mg/dL — ABNORMAL HIGH (ref 70–99)
Potassium: 4.2 mmol/L (ref 3.5–5.1)
Sodium: 142 mmol/L (ref 135–145)

## 2021-02-22 LAB — GLUCOSE, CAPILLARY
Glucose-Capillary: 146 mg/dL — ABNORMAL HIGH (ref 70–99)
Glucose-Capillary: 185 mg/dL — ABNORMAL HIGH (ref 70–99)
Glucose-Capillary: 244 mg/dL — ABNORMAL HIGH (ref 70–99)
Glucose-Capillary: 274 mg/dL — ABNORMAL HIGH (ref 70–99)
Glucose-Capillary: 313 mg/dL — ABNORMAL HIGH (ref 70–99)
Glucose-Capillary: 371 mg/dL — ABNORMAL HIGH (ref 70–99)

## 2021-02-22 LAB — LEGIONELLA PNEUMOPHILA SEROGP 1 UR AG: L. pneumophila Serogp 1 Ur Ag: NEGATIVE

## 2021-02-22 LAB — MAGNESIUM: Magnesium: 2 mg/dL (ref 1.7–2.4)

## 2021-02-22 LAB — HIV ANTIBODY (ROUTINE TESTING W REFLEX): HIV Screen 4th Generation wRfx: NONREACTIVE

## 2021-02-22 MED ORDER — SODIUM CHLORIDE 0.9 % IV SOLN: INTRAVENOUS | Status: DC | PRN

## 2021-02-22 MED ORDER — INSULIN GLARGINE-YFGN 100 UNIT/ML ~~LOC~~ SOLN
12.0000 [IU] | Freq: Every day | SUBCUTANEOUS | Status: DC
  Administered 2021-02-22: 12 [IU] via SUBCUTANEOUS
  Filled 2021-02-22 (×2): qty 0.12

## 2021-02-22 NOTE — Progress Notes (Signed)
PROGRESS NOTE    Samuel Thomas  NKN:[id number] DOB: 01/11/1936 DOA: 02/20/2021 PCP: Jenell Milliner, MD  Outpatient Specialists: cardiology    Brief Narrative:   From admission hpi Samuel Thomas is a 85 y.o. male with medical history significant for DM, HTN, diastolic heart failure, paroxysmal A. fib on Xarelto and Tikosyn, thyroid nodules, iron deficiency anemia, frequent falls, COPD, hearing impairment chronic opioid use, recently hospitalized for pneumonia from 10/27-11/4 and discharged to rehab , who presents to the ED from rehab due to blood sugar in the 500s for several days and altered mental status described as increased confusion.  History obtained mostly from ER provider and ER documentation due to patient's confusion.  O2 sat 85% on room air   Assessment & Plan:   Active Problems:   Chronic diastolic heart failure (HCC)   Essential hypertension   AKI (acute kidney injury) (HCC)   Chronic, continuous use of opioids   COPD (chronic obstructive pulmonary disease) (HCC)   Atrial fibrillation with RVR (HCC)   Polypharmacy   HCAP (healthcare-associated pneumonia)   Hyperglycemia due to type 2 diabetes mellitus (HCC)   Acute on chronic anemia   Acute respiratory failure with hypercapnia (HCC)   Acute metabolic encephalopathy   Severe sepsis (HCC)  # Healthcare associated pneumonia # Sepsis # Pleural effusion # COPD Sepsis criteria by tachycardia, tachypnea, elevated lactic acid, encephalopathy. Hypoxic on arrival. CXR with R lung disease with effusion, malignancy not excluded. Treated with cephalosporin and azithromycin for CAP earlier this month. S/p fluid resuscitation. Currently hemodynamically stable. Covid/flu neg. CT with large effusion s/p IR 2 L paracentesis 11/11. Exudative by light's criteria. Culture ngtd. Urine strep pneumo antigen positive. Respiratory panel neg. Respiratory status improved - cont zosyn - no wheezing or productive cough, will d/c steroids - f/u  cultures - sputum for culture if able to obtain - pulm consult for further assistance w/ exudative effusion  # Acute metabolic encephalopathy Likely 2/2 infection, hypoxia. CTH neg. Somewhat improved - treating as above - hold home gabapentin  # Acute hypoxic respiratory failure O2 in mid 80s on arrival, normalized on 3 L, now weaned down to 2 - [place] O2, wean as able  # A fib with RVR RVR resolved - cont home dilt, tikosyn - xarelto  # Acute kidney injury Resolved w/ fluid resuscitation  # Anemia Likely 2/2 chronic disease. Stable hgb 9.1 - monitor  # T2DM Initial hyperglycemia resolved, now hyperglycemic - incr lantus to 12; cont SSI  # HFpEF Appears euvolemic - home furosemide 40 qd on hold  # HTN Here bp mildly elevated - cont home diltiazem, atorvastatin  # BPH Home proscar, flomax   DVT prophylaxis: xarelto Code Status: DNR Family Communication: grandson and poa david updated 11/12  Level of care: Progressive Status is: Inpatient  Remains inpatient appropriate because: severity of illness        Consultants:  none  Procedures: none  Antimicrobials:  Vanc/cefepime/flagyl    Subjective: Awake and alert, complaining of chronic knee pain. Denies cough/dyspnea  Objective: Vitals:   02/22/21 0413 02/22/21 0756 02/22/21 0840 02/22/21 1146  BP: 127/69 140/74  136/65  Pulse: 76 85 78 78  Resp: 18 18 16 18   Temp: 98.2 F (36.8 C) 98.4 F (36.9 C)  97.9 F (36.6 C)  TempSrc: Oral     SpO2: 97% 99% 93% 98%  Weight:      Height:        Intake/Output Summary (Last 24 hours)  at 02/22/2021 1337 Last data filed at 02/22/2021 0502 Gross per 24 hour  Intake 50.01 ml  Output 200 ml  Net -149.99 ml   Filed Weights   02/20/21 2000 02/21/21 0830  Weight: 80.4 kg 81 kg    Examination:  General exam: Appears calm and comfortable,  Respiratory system: rales at bases, no wheeze Cardiovascular system: irreg irreg, mild systolic murmur, no  edema Gastrointestinal system: Abdomen is nondistended, soft and nontender. No organomegaly or masses felt. Normal bowel sounds heard. Central nervous system: awake and alert, moves all 4 extremities Extremities: b/l le edema Skin: hyperpigmentation lower calves Psychiatry: calm, confused    Data Reviewed: I have personally reviewed following labs and imaging studies  CBC: Recent Labs  Lab 02/20/21 1743 02/21/21 0621 02/22/21 0547  WBC 7.7 5.5 6.0  NEUTROABS 6.6  --   --   HGB 10.1* 8.2* 9.1*  HCT 32.3* 27.2* 28.8*  MCV 99.7 99.3 95.4  PLT 230 195 238   Basic Metabolic Panel: Recent Labs  Lab 02/20/21 1743 02/21/21 0746 02/22/21 0547  NA 138 141 142  K 4.6 4.6 4.2  CL 98 104 104  CO2 34* 29 31  GLUCOSE 345* 155* 178*  BUN 25* 20 20  CREATININE 1.38* 1.16 1.12  CALCIUM 8.5* 8.1* 8.5*  MG  --   --  2.0   GFR: Estimated Creatinine Clearance: 51 mL/min (by C-G formula based on SCr of 1.12 mg/dL). Liver Function Tests: Recent Labs  Lab 02/20/21 1743 02/21/21 1527  AST 19  --   ALT 17  --   ALKPHOS 82  --   BILITOT 0.5  --   PROT 6.4* 6.0*  ALBUMIN 2.6*  --    No results for input(s): LIPASE, AMYLASE in the last 168 hours. No results for input(s): AMMONIA in the last 168 hours. Coagulation Profile: Recent Labs  Lab 02/20/21 1743 02/21/21 0621  INR 1.4* 1.3*   Cardiac Enzymes: No results for input(s): CKTOTAL, CKMB, CKMBINDEX, TROPONINI in the last 168 hours. BNP (last 3 results) No results for input(s): PROBNP in the last 8760 hours. HbA1C: No results for input(s): HGBA1C in the last 72 hours. CBG: Recent Labs  Lab 02/21/21 2154 02/22/21 0033 02/22/21 0409 02/22/21 0757 02/22/21 1147  GLUCAP 350* 313* 185* 146* 244*   Lipid Profile: No results for input(s): CHOL, HDL, LDLCALC, TRIG, CHOLHDL, LDLDIRECT in the last 72 hours. Thyroid Function Tests: No results for input(s): TSH, T4TOTAL, FREET4, T3FREE, THYROIDAB in the last 72 hours. Anemia  Panel: No results for input(s): VITAMINB12, FOLATE, FERRITIN, TIBC, IRON, RETICCTPCT in the last 72 hours. Urine analysis:    Component Value Date/Time   COLORURINE YELLOW (A) 02/20/2021 1743   APPEARANCEUR CLEAR (A) 02/20/2021 1743   LABSPEC 1.010 02/20/2021 1743   PHURINE 5.0 02/20/2021 1743   GLUCOSEU 150 (A) 02/20/2021 1743   HGBUR NEGATIVE 02/20/2021 1743   BILIRUBINUR NEGATIVE 02/20/2021 1743   KETONESUR NEGATIVE 02/20/2021 1743   PROTEINUR NEGATIVE 02/20/2021 1743   UROBILINOGEN 0.2 12/01/2013 2020   NITRITE NEGATIVE 02/20/2021 1743   LEUKOCYTESUR NEGATIVE 02/20/2021 1743   Sepsis Labs: @LABRCNTIP (procalcitonin:4,lacticidven:4)  ) Recent Results (from the past 240 hour(s))  Blood Culture (routine x 2)     Status: None (Preliminary result)   Collection Time: 02/20/21  5:33 PM   Specimen: BLOOD  Result Value Ref Range Status   Specimen Description BLOOD LEFT ANTECUBITAL  Final   Special Requests   Final    BOTTLES DRAWN AEROBIC  AND ANAEROBIC Blood Culture adequate volume   Culture   Final    NO GROWTH 2 DAYS Performed at [facility], [address] [place], [place] [address]    Report Status PENDING  Incomplete  Resp Panel by RT-PCR (Flu A&B, Covid) Nasopharyngeal Swab     Status: None   Collection Time: 02/20/21  5:43 PM   Specimen: Nasopharyngeal Swab; Nasopharyngeal(NP) swabs in vial transport medium  Result Value Ref Range Status   SARS Coronavirus 2 by RT PCR NEGATIVE NEGATIVE Final    Comment: (NOTE) SARS-CoV-2 target nucleic acids are NOT DETECTED.  The SARS-CoV-2 RNA is generally detectable in upper respiratory specimens during the acute phase of infection. The lowest concentration of SARS-CoV-2 viral copies this assay can detect is 138 copies/mL. A negative result does not preclude SARS-Cov-2 infection and should not be used as the sole basis for treatment or other patient management decisions. A negative result may occur with  improper  specimen collection/handling, submission of specimen other than nasopharyngeal swab, presence of viral mutation(s) within the areas targeted by this assay, and inadequate number of viral copies(<138 copies/mL). A negative result must be combined with clinical observations, patient history, and epidemiological information. The expected result is Negative.  Fact Sheet for Patients:  BloggerCourse.com  Fact Sheet for Healthcare Providers:  SeriousBroker.it  This test is no t yet approved or cleared by the [place] FDA and  has been authorized for detection and/or diagnosis of SARS-CoV-2 by FDA under an Emergency Use Authorization (EUA). This EUA will remain  in effect (meaning this test can be used) for the duration of the COVID-19 declaration under Section 564(b)(1) of the Act, 21 U.S.C.section 360bbb-3(b)(1), unless the authorization is terminated  or revoked sooner.       Influenza A by PCR NEGATIVE NEGATIVE Final   Influenza B by PCR NEGATIVE NEGATIVE Final    Comment: (NOTE) The Xpert Xpress SARS-CoV-2/FLU/RSV plus assay is intended as an aid in the diagnosis of influenza from Nasopharyngeal swab specimens and should not be used as a sole basis for treatment. Nasal washings and aspirates are unacceptable for Xpert Xpress SARS-CoV-2/FLU/RSV testing.  Fact Sheet for Patients: BloggerCourse.com  Fact Sheet for Healthcare Providers: SeriousBroker.it  This test is not yet approved or cleared by the [place] FDA and has been authorized for detection and/or diagnosis of SARS-CoV-2 by FDA under an Emergency Use Authorization (EUA). This EUA will remain in effect (meaning this test can be used) for the duration of the COVID-19 declaration under Section 564(b)(1) of the Act, 21 U.S.C. section 360bbb-3(b)(1), unless the authorization is terminated or revoked.  Performed at  [facility], [address] [place], [place] [address]   Urine Culture     Status: None   Collection Time: 02/20/21  5:43 PM   Specimen: In/Out Cath Urine  Result Value Ref Range Status   Specimen Description   Final    IN/OUT CATH URINE Performed at [facility], [address], [place], [place] [address]    Special Requests   Final    NONE Performed at [facility], [address] [place], [place] [address]    Culture   Final    NO GROWTH Performed at [facility], [address], [place], [place] [address]    Report Status 02/21/2021 FINAL  Final  Blood Culture (routine x 2)     Status: None (Preliminary result)   Collection Time: 02/20/21  5:53 PM  Specimen: BLOOD  Result Value Ref Range Status   Specimen Description BLOOD BLOOD LEFT FOREARM  Final   Special Requests   Final    BOTTLES DRAWN AEROBIC AND ANAEROBIC Blood Culture adequate volume   Culture   Final    NO GROWTH 2 DAYS Performed at [facility], [address] [place], [place] [address]    Report Status PENDING  Incomplete  MRSA Next Gen by PCR, Nasal     Status: None   Collection Time: 02/20/21 10:41 PM   Specimen: Nasal Mucosa; Nasal Swab  Result Value Ref Range Status   MRSA by PCR Next Gen NOT DETECTED NOT DETECTED Final    Comment: (NOTE) The GeneXpert MRSA Assay (FDA approved for NASAL specimens only), is one component of a comprehensive MRSA colonization surveillance program. It is not intended to diagnose MRSA infection nor to guide or monitor treatment for MRSA infections. Test performance is not FDA approved in patients less than 58 years old. Performed at [facility], [address] [place], [place] [address]   Respiratory (~20 pathogens) panel by PCR     Status: None   Collection Time: 02/21/21 10:06 AM   Specimen: Nasopharyngeal Swab; Respiratory  Result Value Ref Range Status   Adenovirus NOT DETECTED NOT DETECTED Final    Coronavirus 229E NOT DETECTED NOT DETECTED Final    Comment: (NOTE) The Coronavirus on the Respiratory Panel, DOES NOT test for the novel  Coronavirus (2019 nCoV)    Coronavirus HKU1 NOT DETECTED NOT DETECTED Final   Coronavirus NL63 NOT DETECTED NOT DETECTED Final   Coronavirus OC43 NOT DETECTED NOT DETECTED Final   Metapneumovirus NOT DETECTED NOT DETECTED Final   Rhinovirus / Enterovirus NOT DETECTED NOT DETECTED Final   Influenza A NOT DETECTED NOT DETECTED Final   Influenza B NOT DETECTED NOT DETECTED Final   Parainfluenza Virus 1 NOT DETECTED NOT DETECTED Final   Parainfluenza Virus 2 NOT DETECTED NOT DETECTED Final   Parainfluenza Virus 3 NOT DETECTED NOT DETECTED Final   Parainfluenza Virus 4 NOT DETECTED NOT DETECTED Final   Respiratory Syncytial Virus NOT DETECTED NOT DETECTED Final   Bordetella pertussis NOT DETECTED NOT DETECTED Final   Bordetella Parapertussis NOT DETECTED NOT DETECTED Final   Chlamydophila pneumoniae NOT DETECTED NOT DETECTED Final   Mycoplasma pneumoniae NOT DETECTED NOT DETECTED Final    Comment: Performed at [facility], [address], [place], [place] [address]  Body fluid culture w Gram Stain     Status: None (Preliminary result)   Collection Time: 02/21/21  4:39 PM   Specimen: PATH Cytology Pleural fluid  Result Value Ref Range Status   Specimen Description   Final    PLEURAL Performed at [facility], [address] [place], [place] [address]    Special Requests   Final    NONE Performed at [facility], [address] [place], [place] [address]    Gram Stain   Final    FEW WBC PRESENT, PREDOMINANTLY MONONUCLEAR NO ORGANISMS SEEN    Culture   Final    NO GROWTH < 24 HOURS Performed at [facility], [address] [place], [place] [address]    Report Status PENDING  Incomplete         Radiology Studies: CT HEAD WO CONTRAST ( )  Result Date: 02/21/2021 CLINICAL DATA:  Encephalopathy EXAM:  CT HEAD WITHOUT CONTRAST TECHNIQUE: Contiguous axial images were obtained from the base of the skull  through the vertex without intravenous contrast. COMPARISON:  Prior head CT 02/06/2021 FINDINGS: Brain: No evidence of acute infarction, hemorrhage, hydrocephalus, extra-axial collection or mass lesion/mass effect. Mild cerebral and cerebellar cortical volume loss. Periventricular white matter hypoattenuation most consistent with chronic microvascular ischemic white matter disease. Punctate lacunar infarct in the bilateral thalami larger on the right than the left. Vascular: No hyperdense vessel or unexpected calcification. Skull: Normal. Negative for fracture or focal lesion. Sinuses/Orbits: No acute finding. Other: None. IMPRESSION: 1. No acute intracranial abnormality. 2. Stable atrophy, chronic microvascular ischemic white matter disease and bilateral punctate thalamic lacunar infarcts. Electronically Signed   By: Malachy Moan M.D.   On: 02/21/2021 10:12   CT CHEST W CONTRAST  Result Date: 02/21/2021 CLINICAL DATA:  Pleural effusion, pneumonia EXAM: CT CHEST WITH CONTRAST TECHNIQUE: Multidetector CT imaging of the chest was performed during intravenous contrast administration. CONTRAST:  75mL OMNIPAQUE IOHEXOL 300 MG/ML  SOLN COMPARISON:  Previous studies including the chest radiographs done on 01/20/2021 FINDINGS: Cardiovascular: Extensive coronary artery calcifications are seen. Heart is enlarged in size. Small pericardial effusion is present. There is homogeneous enhancement in thoracic aorta. There are no intraluminal filling defects in the central pulmonary artery branches. Segmental and subsegmental pulmonary artery branches are not adequately visualized for evaluation. Mediastinum/Nodes: There are slightly enlarged lymph nodes in the mediastinum and hilar regions largest measuring 10 mm in short axis in the precarinal region. Lungs/Pleura: There is large right pleural effusion. There is partial  compression atelectasis in the right lung possibility of pneumonia in the right lung is not excluded. In the image 82 of series 5, there is 12 mm pocket of air in the inferior margin of right lower lobe. In the image 85 of series 5, there is possible small 15 mm loculated fluid collection in the right lower lobe adjacent to the pocket of air. As far as seen, there are no central mass lesions in the right hilum. Small left pleural effusion is seen. Part of the left pleural effusion is noted within the interlobar fissure. Small patchy infiltrates are seen in the left parahilar region and left lower lobe. There is no pneumothorax. Upper Abdomen: There is mild hyperplasia of adrenals. There is small coarse calcification in the left adrenal. Musculoskeletal: There is calcification in the anterior spinal ligament. Please correlate for possible ankylosing spondylitis IMPRESSION: Large right pleural effusion. There is decreased aeration in the right lung, possibly compression atelectasis and pneumonia. There is small 15 mm loculated fluid collection with possible small pocket of air in the inferior aspect of right lower lobe. This may suggest incidental bulla with fluid or infectious process with possible small lung abscess. As far as seen, there are no central obstructing lesions in the right hilum. There are slightly enlarged lymph nodes in the mediastinum, possibly suggesting reactive hyperplasia of lymph nodes. Small left pleural effusion. There are small patchy infiltrates in the posterior segment of left upper lobe suggesting atelectasis/pneumonia. Extensive coronary artery calcifications are seen. Small pericardial effusion. There is calcification in the anterior spinal ligament in thoracic spine. Please correlate for possible ankylosing spondylitis. Electronically Signed   By: Ernie Avena M.D.   On: 02/21/2021 10:25   DG Chest Port 1 View  Result Date: 02/21/2021 CLINICAL DATA:  Post thoracentesis EXAM:  PORTABLE CHEST 1 VIEW COMPARISON:  02/20/2021, CT 02/21/2021 FINDINGS: Decreased right pleural effusion with small residual post thoracentesis. No pneumothorax is seen. Cardiomegaly with vascular congestion. Patchy airspace disease in the left mid lung and  right base. IMPRESSION: 1. Decreased right pleural effusion post thoracentesis without visible pneumothorax 2. Hazy left mid lung and right basilar opacity may reflect atelectasis or pneumonia. Electronically Signed   By: Jasmine Pang M.D.   On: 02/21/2021 16:51   DG Chest Port 1 View  Result Date: 02/20/2021 CLINICAL DATA:  Question sepsis. EXAM: PORTABLE CHEST 1 VIEW COMPARISON:  02/10/2021 FINDINGS: Interval progression of extensive infiltrate in the right mid and lower lung. Progression of small to moderate right pleural effusion Left perihilar airspace disease is present with mild improvement. No effusion on the left. Negative for heart failure. IMPRESSION: Progressive airspace disease in the right mid and lower lung with progressive right effusion. Possible progressive pneumonia. Underlying mass lesion possible. Consider chest CT with contrast for further evaluation. Left perihilar airspace densities shows interval improvement. Electronically Signed   By: Marlan Palau M.D.   On: 02/20/2021 17:51        Scheduled Meds:  atorvastatin  20 mg Oral QHS   budesonide  0.5 mg Inhalation Daily   diltiazem  240 mg Oral Daily   dofetilide  125 mcg Oral BID   finasteride  5 mg Oral Daily   folic acid  1 mg Oral Daily   insulin aspart  0-15 Units Subcutaneous Q4H   insulin glargine-yfgn  10 Units Subcutaneous QHS   methylPREDNISolone (SOLU-MEDROL) injection  120 mg Intravenous Q24H   pantoprazole  40 mg Oral Daily   potassium chloride SA  20 mEq Oral Daily   rivaroxaban  20 mg Oral Q supper   senna-docusate  2 tablet Oral QHS   tamsulosin  0.4 mg Oral Daily   tiotropium  18 mcg Inhalation Daily   Continuous Infusions:  sodium chloride 5  mL/hr at 02/22/21 0502   piperacillin-tazobactam (ZOSYN)  IV 3.375 g (02/22/21 0502)     LOS: 2 days    Time spent: 30 min    Silvano Bilis, MD [facility]   If 7PM-7AM, please contact night-coverage [email or web address] Password [email or web address] 02/22/2021, 1:37 PM

## 2021-02-23 DIAGNOSIS — J189 Pneumonia, unspecified organism: Secondary | ICD-10-CM | POA: Diagnosis not present

## 2021-02-23 DIAGNOSIS — I5032 Chronic diastolic (congestive) heart failure: Secondary | ICD-10-CM

## 2021-02-23 DIAGNOSIS — J90 Pleural effusion, not elsewhere classified: Secondary | ICD-10-CM

## 2021-02-23 DIAGNOSIS — J449 Chronic obstructive pulmonary disease, unspecified: Secondary | ICD-10-CM

## 2021-02-23 DIAGNOSIS — I4891 Unspecified atrial fibrillation: Secondary | ICD-10-CM

## 2021-02-23 LAB — BASIC METABOLIC PANEL
Anion gap: 5 (ref 5–15)
BUN: 26 mg/dL — ABNORMAL HIGH (ref 8–23)
CO2: 31 mmol/L (ref 22–32)
Calcium: 8.4 mg/dL — ABNORMAL LOW (ref 8.9–10.3)
Chloride: 104 mmol/L (ref 98–111)
Creatinine, Ser: 1.08 mg/dL (ref 0.61–1.24)
GFR, Estimated: >60 mL/min (ref >60)
Glucose, Bld: 187 mg/dL — ABNORMAL HIGH (ref 70–99)
Potassium: 4.2 mmol/L (ref 3.5–5.1)
Sodium: 140 mmol/L (ref 135–145)

## 2021-02-23 LAB — GLUCOSE, CAPILLARY
Glucose-Capillary: 157 mg/dL — ABNORMAL HIGH (ref 70–99)
Glucose-Capillary: 176 mg/dL — ABNORMAL HIGH (ref 70–99)
Glucose-Capillary: 217 mg/dL — ABNORMAL HIGH (ref 70–99)
Glucose-Capillary: 243 mg/dL — ABNORMAL HIGH (ref 70–99)
Glucose-Capillary: 276 mg/dL — ABNORMAL HIGH (ref 70–99)
Glucose-Capillary: 378 mg/dL — ABNORMAL HIGH (ref 70–99)

## 2021-02-23 LAB — CBC
HCT: 28.4 % — ABNORMAL LOW (ref 39.0–52.0)
Hemoglobin: 9.2 g/dL — ABNORMAL LOW (ref 13.0–17.0)
MCH: 30.8 pg (ref 26.0–34.0)
MCHC: 32.4 g/dL (ref 30.0–36.0)
MCV: 95 fL (ref 80.0–100.0)
Platelets: 239 10*3/uL (ref 150–400)
RBC: 2.99 MIL/uL — ABNORMAL LOW (ref 4.22–5.81)
RDW: 13.3 % (ref 11.5–15.5)
WBC: 5.8 10*3/uL (ref 4.0–10.5)
nRBC: 0 % (ref 0.0–0.2)

## 2021-02-23 LAB — MAGNESIUM: Magnesium: 2.4 mg/dL (ref 1.7–2.4)

## 2021-02-23 MED ORDER — FUROSEMIDE 40 MG PO TABS
40.0000 mg | ORAL_TABLET | Freq: Every day | ORAL | Status: DC
  Administered 2021-02-23 – 2021-02-26 (×4): 40 mg via ORAL
  Filled 2021-02-23 (×4): qty 1

## 2021-02-23 MED ORDER — INSULIN GLARGINE-YFGN 100 UNIT/ML ~~LOC~~ SOLN
16.0000 [IU] | Freq: Every day | SUBCUTANEOUS | Status: DC
  Administered 2021-02-23 – 2021-02-24 (×2): 16 [IU] via SUBCUTANEOUS
  Filled 2021-02-23 (×3): qty 0.16

## 2021-02-23 NOTE — Progress Notes (Signed)
Speech Language Pathology Treatment:    Patient Details Name: Samuel Thomas MRN: [id number] DOB: April 25, 1935 Today's Date: 02/23/2021 Time: 0933-1000 SLP Time Calculation (min) (ACUTE ONLY): 27 min  Assessment / Plan / Recommendation Clinical Impression  Pt seen for diet tolerance. RN present and administering medication prior to SLP session. Pt observed with items from breakfast tray including scrambled eggs, diced potatoes, oatmeal, and nectar-thick liquids (OJ and mild). Oral phase was functional across textures. No overt s/sx pharyngeal dysphagia across trials. Pt with intermittent belching, ?esophageal component to pt's dysphagia.   Pt reported difficulty, including coughing, with thin liquids "for some time now" and is agreeable to participating in MBSS this upcoming week to instrumental evaluate pt's pharyngeal swallow function.   In the interim, pt OK to continue mech soft diet with nectar-thick liquids and safe swallowing strategies as outlined below.  Pt and RN made aware of diet recommendations, safe swallowing precautions, and SLP POC. ?full understanding by pt. Pt may benefit from reinforcement of content.   HPI HPI: Pt is a 85 y.o. male with medical history significant for DM, HTN, diastolic heart failure, paroxysmal A. fib on Xarelto and Tikosyn, thyroid nodules, iron deficiency anemia, frequent falls, COPD, hearing impairment chronic opioid use, recently hospitalized for pneumonia from 10/27-11/4 and discharged to rehab , who presents to the ED from rehab due to blood sugar in the 500s for several days and altered mental status described as increased confusion.   CT of Chest: "Large right pleural effusion. There is decreased aeration in the  right lung, possibly compression atelectasis and pneumonia. There is  small 15 mm loculated fluid collection with possible small pocket of  air in the inferior aspect of right lower lobe. This may suggest  incidental bulla with fluid or infectious  process with possible  small lung abscess. As far as seen, there are no central obstructing  lesions in the right hilum. There are slightly enlarged lymph nodes  in the mediastinum, possibly suggesting reactive hyperplasia of  lymph nodes.  Small left pleural effusion. There are small patchy infiltrates in  the posterior segment of left upper lobe suggesting  atelectasis/pneumonia.     Extensive coronary artery calcifications are seen.". CT of Head: "No acute intracranial abnormality.  2. Stable atrophy, chronic microvascular ischemic white matter  disease and bilateral punctate thalamic lacunar infarcts.".      SLP Plan  Continue with current plan of care;MBS      Recommendations for follow up therapy are one component of a multi-disciplinary discharge planning process, led by the attending physician.  Recommendations may be updated based on patient status, additional functional criteria and insurance authorization.    Recommendations  Diet recommendations: Dysphagia 3 (mechanical soft);Nectar-thick liquid Medication Administration: Whole meds with puree Supervision: Patient able to self feed (set up pt) Compensations: Minimize environmental distractions;Slow rate;Small sips/bites Postural Changes and/or Swallow Maneuvers: Seated upright 90 degrees                Oral Care Recommendations: Oral care BID;Oral care before and after PO;Staff/trained caregiver to provide oral care Follow Up Recommendations: Skilled nursing-short term rehab (<3 hours/day) Assistance recommended at discharge: Intermittent Supervision/Assistance SLP Visit Diagnosis: Dysphagia, pharyngeal phase (R13.13) Plan: Continue with current plan of care;MBS       Clyde Canterbury, M.S., CCC-SLP Speech-Language Pathologist [facility] [phone] (ASCOM)                Woodroe Chen  02/23/2021,  11:05 AM

## 2021-02-23 NOTE — Consult Note (Signed)
Pulmonary Critical Care  Initial Consult Note  Samuel Thomas YBO:[id number] DOB: [birth year] DOA: 02/20/2021  Referring physician: Dr. Shonna Chock  Chief Complaint: Pleural effusion  HPI: Samuel Thomas is a 85 y.o. male with multiple medical problems including COPD diabetes hyperlipidemia hypertension who also has a history of diastolic heart failure paroxysmal atrial fibrillation on chronic Tikosyn came into the hospital on October 27 with a diagnosis of pneumonia.  At that time patient was discharged to rehab and came back into the hospital because of high blood sugar.  The patient had a chest x-ray done which showed a large pleural effusion.  Patient underwent thoracentesis and the results were questioning as being exudative I am asked to comment on the results of the thoracentesis.  At the time of evaluation patient is not in any distress.  He appears to be comfortable.  Review of Systems:  Constitutional:  No weight loss, night sweats, Fevers, chills, fatigue.  HEENT:  No headaches, nasal congestion, post nasal drip,  Cardio-vascular:  No chest pain, Orthopnea, PND, swelling in lower extremities, anasarca, dizziness, palpitations  GI:  No heartburn, indigestion, abdominal pain, nausea, vomiting, diarrhea  Resp:  +shortness of breath with exertion or at rest. +cough, No coughing up of blood.No wheezing Skin:  no rash or lesions.  Musculoskeletal:  No joint pain or swelling.   Remainder ROS performed and is unremarkable other than noted in HPI  Past Medical History:  Diagnosis Date   COPD (chronic obstructive pulmonary disease) (HCC)    DM2 (diabetes mellitus, type 2) (HCC)    HLD (hyperlipidemia)    HTN (hypertension)    Past Surgical History:  Procedure Laterality Date   HERNIA REPAIR     Social History:  reports that he quit smoking about 47 years ago. His smoking use included cigarettes. He has never used smokeless tobacco. He reports that he does not drink alcohol and does  not use drugs.  Allergies  Allergen Reactions   Penicillins Swelling and Rash    Break outs swelling Other reaction(s): SWELLING in 1955   Apixaban Other (See Comments)    Cold intolerance Cold intolerance    Prednisone Rash and Other (See Comments)    Makes mean  Significant mood and behavioral changes - 'felt like I wasn't in control of anything'    History reviewed. No pertinent family history.  Prior to Admission medications   Medication Sig Start Date End Date Taking? Authorizing Provider  atorvastatin (LIPITOR) 20 MG tablet Take 20 mg by mouth at bedtime.   Yes [provider]  budesonide (PULMICORT) 0.5 MG/2ML nebulizer solution Inhale 0.5 mg into the lungs daily. 02/07/15  Yes [provider]  Calcium Carb-Cholecalciferol 600-400 MG-UNIT TABS Take 1 tablet by mouth daily.   Yes [provider]  cetirizine (ZYRTEC) 10 MG tablet cetirizine 10 mg tablet  Take 1 tablet every day by oral route.   Yes [provider]  diltiazem (CARDIZEM CD) 240 MG 24 hr capsule Take 1 capsule (240 mg total) by mouth daily. 02/14/21  Yes Arnetha Courser, MD  dofetilide (TIKOSYN) 125 MCG capsule Take 125 mcg by mouth 2 (two) times daily. 11/29/17  Yes [provider]  Ferrous Sulfate-C-Folic Acid 105-500-0.8 MG TBCR Take 1 tablet by mouth 2 (two) times daily. 02/13/21  Yes Arnetha Courser, MD  finasteride (PROSCAR) 5 MG tablet Take 5 mg by mouth daily. 10/31/18  Yes [provider]  fluticasone (FLONASE) 50 MCG/ACT nasal spray Place 1-2 sprays into  both nostrils daily.   Yes [provider]  folic acid (FOLVITE) 1 MG tablet Take 1 mg by mouth daily.   Yes [provider]  furosemide (LASIX) 40 MG tablet Take 1 tablet (40 mg total) by mouth daily. 12/07/13  Yes Zannie Cove, MD  gabapentin (NEURONTIN) 300 MG capsule Take 300 mg by mouth 3 (three) times daily. 01/17/21  Yes [provider]  insulin glargine (LANTUS) 100 unit/mL  SOPN Inject 10 Units into the skin at bedtime.   Yes [provider]  insulin lispro (HUMALOG) 100 UNIT/ML injection Inject 6 Units into the skin 3 (three) times daily before meals.   Yes [provider]  Lactobacillus Acid-Pectin (ACIDOPHILUS/PECTIN) CAPS Acidophilus Probiotic 100 million cell-10 mg capsule  Take 1 capsule every day by oral route.   Yes [provider]  magnesium oxide (MAG-OX) 400 (240 Mg) MG tablet Take 1 tablet (400 mg total) by mouth 2 (two) times daily. 02/13/21  Yes Arnetha Courser, MD  metFORMIN (GLUCOPHAGE) 1000 MG tablet Take 1,000 mg by mouth 2 (two) times daily. 12/17/20  Yes [provider]  Multiple Vitamin (ONE DAILY) tablet Take 1 tablet by mouth daily.   Yes [provider]  omeprazole (PRILOSEC) 20 MG capsule Take 20 mg by mouth daily. 06/26/20 06/26/21 Yes [provider]  potassium chloride SA (K-DUR,KLOR-CON) 20 MEQ tablet Take 20 mEq by mouth daily. 02/02/14  Yes [provider]  rivaroxaban (XARELTO) 20 MG TABS tablet Take 1 tablet (20 mg total) by mouth daily with supper. 02/13/21  Yes Arnetha Courser, MD  senna-docusate (SENOKOT-S) 8.6-50 MG tablet Senna with Docusate Sodium 8.6 mg-50 mg tablet  Take 2 tablets twice a day by oral route.   Yes [provider]  tamsulosin (FLOMAX) 0.4 MG CAPS capsule Take 0.4 mg by mouth daily.   Yes [provider]  tiotropium (SPIRIVA HANDIHALER) 18 MCG inhalation capsule Spiriva with HandiHaler 18 mcg and inhalation capsules   Yes [provider]  albuterol (VENTOLIN HFA) 108 (90 Base) MCG/ACT inhaler Ventolin HFA 90 mcg/actuation aerosol inhaler  Inhale 2 puffs by mouth every 6 hours as needed for wheezing 10/06/19   [provider]  colchicine 0.6 MG tablet Take 0.6 mg by mouth as needed (gout). 06/13/20 06/13/21  [provider]  guaiFENesin-dextromethorphan (ROBITUSSIN DM) 100-10 MG/5ML syrup Take 10 mLs by mouth every 4 (four)  hours as needed for cough. 02/13/21   Arnetha Courser, MD  ipratropium-albuterol (DUONEB) 0.5-2.5 (3) MG/3ML SOLN Inhale into the lungs. 04/16/19 11/10/20  [provider]  Nutritional Supplements (FEEDING SUPPLEMENT, NEPRO CARB STEADY,) LIQD Take 237 mLs by mouth 2 (two) times daily between meals. 02/13/21   Arnetha Courser, MD  polyethylene glycol (MIRALAX / GLYCOLAX) 17 g packet Take 17 g by mouth daily as needed for mild constipation. 02/13/21   Arnetha Courser, MD  TRUETRACK TEST test strip  02/21/14   [provider]   Physical Exam: Vitals:   02/23/21 0036 02/23/21 0818 02/23/21 1121 02/23/21 1520  BP:  121/62 131/61 138/64  Pulse:  69 79 88  Resp:  18 18 18   Temp: 97.6 F (36.4 C) 98.4 F (36.9 C) 98.3 F (36.8 C) 98 F (36.7 C)  TempSrc:      SpO2:  100% 100% 99%  Weight:      Height:       Body mass index is 27.97 kg/m.   Wt Readings from Last 3 Encounters:  02/21/21 81 kg  02/06/21 81.6 kg  02/28/14 107 kg    General:  Appears calm and comfortable Eyes: PERRL, normal lids, irises & conjunctiva ENT: grossly normal hearing, lips & tongue Neck: no LAD, masses or thyromegaly Cardiovascular: RRR, no m/r/g. No LE edema. Respiratory: CTA bilaterally, no w/r/r.       Normal respiratory effort. Abdomen: soft, nontender Skin: no rash or induration seen on limited exam Musculoskeletal: grossly normal tone BUE/BLE Psychiatric: grossly normal mood and affect Neurologic: grossly non-focal.          Labs on Admission:  Basic Metabolic Panel: Recent Labs  Lab 02/20/21 1743 02/21/21 0746 02/22/21 0547 02/23/21 0505  NA 138 141 142 140  K 4.6 4.6 4.2 4.2  CL 98 104 104 104  CO2 34* 29 31 31   GLUCOSE 345* 155* 178* 187*  BUN 25* 20 20 26*  CREATININE 1.38* 1.16 1.12 1.08  CALCIUM 8.5* 8.1* 8.5* 8.4*  MG  --   --  2.0 2.4   Liver Function Tests: Recent Labs  Lab 02/20/21 1743 02/21/21 1527  AST 19  --   ALT 17  --   ALKPHOS 82  --   BILITOT 0.5   --   PROT 6.4* 6.0*  ALBUMIN 2.6*  --    No results for input(s): LIPASE, AMYLASE in the last 168 hours. No results for input(s): AMMONIA in the last 168 hours. CBC: Recent Labs  Lab 02/20/21 1743 02/21/21 0621 02/22/21 0547 02/23/21 0505  WBC 7.7 5.5 6.0 5.8  NEUTROABS 6.6  --   --   --   HGB 10.1* 8.2* 9.1* 9.2*  HCT 32.3* 27.2* 28.8* 28.4*  MCV 99.7 99.3 95.4 95.0  PLT 230 195 238 239   Cardiac Enzymes: No results for input(s): CKTOTAL, CKMB, CKMBINDEX, TROPONINI in the last 168 hours.  BNP (last 3 results) No results for input(s): BNP in the last 8760 hours.  ProBNP (last 3 results) No results for input(s): PROBNP in the last 8760 hours.  CBG: Recent Labs  Lab 02/23/21 0051 02/23/21 0450 02/23/21 0818 02/23/21 1122 02/23/21 1639  GLUCAP 217* 176* 157* 276* 378*    Radiological Exams on Admission: No results found.  EKG: Independently reviewed.  Assessment/Plan Active Problems:   Chronic diastolic heart failure (HCC)   Essential hypertension   AKI (acute kidney injury) (HCC)   Chronic, continuous use of opioids   COPD (chronic obstructive pulmonary disease) (HCC)   Atrial fibrillation with RVR (HCC)   Polypharmacy   HCAP (healthcare-associated pneumonia)   Hyperglycemia due to type 2 diabetes mellitus (HCC)   Acute on chronic anemia   Acute respiratory failure with hypercapnia (HCC)   Acute metabolic encephalopathy   Severe sepsis (HCC)   Pleural effusion the results of the pleural effusion are borderline.  Based on the lights criteria the LDH ratio is elevated however patient also has been on diuretics so this will confound the findings.  Overall the glucose was elevated the protein was not meeting the lights criteria for clear-cut exudate.  This may be a very simple case of a parapneumonic effusion it has been drained he is doing better I would consider doing a follow-up chest x-ray after his discharge.  If there is reaccumulation then further  evaluation should definitely be considered.  Also of note is the effusion was predominantly lymphocytic did not seem to indicate empyema or an infectious etiology COPD medical management we will continue with the patient inhalers as needed. Healthcare associated pneumonia he was treated  for infection complete antibiotics per primary care team Severe sepsis this has resolved  Code Status: Patient is DNR  Family Communication: Patient's daughter was present in the room Disposition Plan: Skilled rehab  Time spent: 70 minutes  I have personally obtained a history, examined the patient, evaluated laboratory and imaging results, formulated the assessment and plan and placed orders.  The Patient requires high complexity decision making for assessment and support. Total Time Spent   Yevonne Pax, MD [facility] Pulmonary Critical Care Medicine Sleep Medicine

## 2021-02-23 NOTE — Progress Notes (Signed)
Per CCMD, QTC is , will administer tikosyn per order.

## 2021-02-23 NOTE — Progress Notes (Signed)
PROGRESS NOTE    Samuel Thomas  JYN:[id number] DOB: 1936/01/23 DOA: 02/20/2021 PCP: Jenell Milliner, MD  Outpatient Specialists: cardiology    Brief Narrative:   From admission hpi Samuel Thomas is a 85 y.o. male with medical history significant for DM, HTN, diastolic heart failure, paroxysmal A. fib on Xarelto and Tikosyn, thyroid nodules, iron deficiency anemia, frequent falls, COPD, hearing impairment chronic opioid use, recently hospitalized for pneumonia from 10/27-11/4 and discharged to rehab , who presents to the ED from rehab due to blood sugar in the 500s for several days and altered mental status described as increased confusion.  History obtained mostly from ER provider and ER documentation due to patient's confusion.  O2 sat 85% on room air   Assessment & Plan:   Active Problems:   Chronic diastolic heart failure (HCC)   Essential hypertension   AKI (acute kidney injury) (HCC)   Chronic, continuous use of opioids   COPD (chronic obstructive pulmonary disease) (HCC)   Atrial fibrillation with RVR (HCC)   Polypharmacy   HCAP (healthcare-associated pneumonia)   Hyperglycemia due to type 2 diabetes mellitus (HCC)   Acute on chronic anemia   Acute respiratory failure with hypercapnia (HCC)   Acute metabolic encephalopathy   Severe sepsis (HCC)  # Healthcare associated pneumonia # Sepsis # Pleural effusion # COPD Sepsis criteria by tachycardia, tachypnea, elevated lactic acid, encephalopathy. Hypoxic on arrival. CXR with R lung disease with effusion, malignancy not excluded. Treated with cephalosporin and azithromycin for CAP earlier this month. S/p fluid resuscitation. Currently hemodynamically stable. Covid/flu neg. CT with large effusion s/p IR 2 L paracentesis 11/11. Exudative by light's criteria. Culture ngtd. Urine strep pneumo antigen positive. Respiratory panel neg. Respiratory status improved - cont zosyn - f/u cultures, ngtd - sputum for culture if able to  obtain - pulm consult for further assistance w/ exudative effusion, recs pending  # Acute metabolic encephalopathy Likely 2/2 infection, hypoxia. CTH neg. Much improved. - treating as above - hold home gabapentin  # Debility Here from SNF - pt consult, likely return to snf  # Acute hypoxic respiratory failure O2 in mid 80s on arrival, normalized on 3 L, now weaned down to 2 and satting 100% - Vansant O2, wean as able. Have reached out to nursing to see about further weaning  # A fib with RVR RVR resolved - cont home dilt, tikosyn - xarelto  # Acute kidney injury Resolved w/ fluid resuscitation  # Anemia Likely 2/2 chronic disease. Stable hgb in the 9s - monitor  # T2DM Initial hyperglycemia resolved, now hyperglycemic - incr lantus to 16; cont SSI  # HFpEF Appears euvolemic - re-start home furosemide  # HTN Here bp mildly elevated - cont home diltiazem, atorvastatin  # BPH Home proscar, flomax   DVT prophylaxis: xarelto Code Status: DNR Family Communication: grandson and poa david updated 11/12  Level of care: Progressive Status is: Inpatient  Remains inpatient appropriate because: severity of illness        Consultants:  none  Procedures: none  Antimicrobials:  zosyn   Subjective: Awake and alert, tolerating diet. Denies cough/dyspnea  Objective: Vitals:   02/23/21 0032 02/23/21 0036 02/23/21 0818 02/23/21 1121  BP: (!) 143/71  121/62 131/61  Pulse: 69  69 79  Resp: Temp:  97.6 F (36.4 C) 98.4 F (36.9 C) 98.3 F (36.8 C)  TempSrc:      SpO2: 100%  100% 100%  Weight:  Height:        Intake/Output Summary (Last 24 hours) at 02/23/2021 1304 Last data filed at 02/23/2021 0950 Gross per 24 hour  Intake 318.02 ml  Output 300 ml  Net 18.02 ml   Filed Weights   02/20/21 2000 02/21/21 0830  Weight: 80.4 kg 81 kg    Examination:  General exam: Appears calm and comfortable,  Respiratory system: rales at bases, no  wheeze Cardiovascular system: irreg irreg, mild systolic murmur, no edema Gastrointestinal system: Abdomen is nondistended, soft and nontender. No organomegaly or masses felt. Normal bowel sounds heard. Central nervous system: awake and alert, moves all 4 extremities Extremities: b/l le edema Skin: hyperpigmentation lower calves Psychiatry: calm, confused    Data Reviewed: I have personally reviewed following labs and imaging studies  CBC: Recent Labs  Lab 02/20/21 1743 02/21/21 0621 02/22/21 0547 02/23/21 0505  WBC 7.7 5.5 6.0 5.8  NEUTROABS 6.6  --   --   --   HGB 10.1* 8.2* 9.1* 9.2*  HCT 32.3* 27.2* 28.8* 28.4*  MCV 99.7 99.3 95.4 95.0  PLT 230 195 238 239   Basic Metabolic Panel: Recent Labs  Lab 02/20/21 1743 02/21/21 0746 02/22/21 0547 02/23/21 0505  NA 138 141 142 140  K 4.6 4.6 4.2 4.2  CL 98 104 104 104  CO2 34* 29 31 31   GLUCOSE 345* 155* 178* 187*  BUN 25* 20 20 26*  CREATININE 1.38* 1.16 1.12 1.08  CALCIUM 8.5* 8.1* 8.5* 8.4*  MG  --   --  2.0 2.4   GFR: Estimated Creatinine Clearance: 52.9 mL/min (by C-G formula based on SCr of 1.08 mg/dL). Liver Function Tests: Recent Labs  Lab 02/20/21 1743 02/21/21 1527  AST 19  --   ALT 17  --   ALKPHOS 82  --   BILITOT 0.5  --   PROT 6.4* 6.0*  ALBUMIN 2.6*  --    No results for input(s): LIPASE, AMYLASE in the last 168 hours. No results for input(s): AMMONIA in the last 168 hours. Coagulation Profile: Recent Labs  Lab 02/20/21 1743 02/21/21 0621  INR 1.4* 1.3*   Cardiac Enzymes: No results for input(s): CKTOTAL, CKMB, CKMBINDEX, TROPONINI in the last 168 hours. BNP (last 3 results) No results for input(s): PROBNP in the last 8760 hours. HbA1C: No results for input(s): HGBA1C in the last 72 hours. CBG: Recent Labs  Lab 02/22/21 2112 02/23/21 0051 02/23/21 0450 02/23/21 0818 02/23/21 1122  GLUCAP 371* 217* 176* 157* 276*   Lipid Profile: No results for input(s): CHOL, HDL, LDLCALC,  TRIG, CHOLHDL, LDLDIRECT in the last 72 hours. Thyroid Function Tests: No results for input(s): TSH, T4TOTAL, FREET4, T3FREE, THYROIDAB in the last 72 hours. Anemia Panel: No results for input(s): VITAMINB12, FOLATE, FERRITIN, TIBC, IRON, RETICCTPCT in the last 72 hours. Urine analysis:    Component Value Date/Time   COLORURINE YELLOW (A) 02/20/2021 1743   APPEARANCEUR CLEAR (A) 02/20/2021 1743   LABSPEC 1.010 02/20/2021 1743   PHURINE 5.0 02/20/2021 1743   GLUCOSEU 150 (A) 02/20/2021 1743   HGBUR NEGATIVE 02/20/2021 1743   BILIRUBINUR NEGATIVE 02/20/2021 1743   KETONESUR NEGATIVE 02/20/2021 1743   PROTEINUR NEGATIVE 02/20/2021 1743   UROBILINOGEN 0.2 12/01/2013 2020   NITRITE NEGATIVE 02/20/2021 1743   LEUKOCYTESUR NEGATIVE 02/20/2021 1743   Sepsis Labs: @LABRCNTIP (procalcitonin:4,lacticidven:4)  ) Recent Results (from the past 240 hour(s))  Blood Culture (routine x 2)     Status: None (Preliminary result)   Collection Time: 02/20/21  5:33 PM   Specimen: BLOOD  Result Value Ref Range Status   Specimen Description BLOOD LEFT ANTECUBITAL  Final   Special Requests   Final    BOTTLES DRAWN AEROBIC AND ANAEROBIC Blood Culture adequate volume   Culture   Final    NO GROWTH 3 DAYS Performed at [facility], [address], [place], [place] [address]    Report Status PENDING  Incomplete  Resp Panel by RT-PCR (Flu A&B, Covid) Nasopharyngeal Swab     Status: None   Collection Time: 02/20/21  5:43 PM   Specimen: Nasopharyngeal Swab; Nasopharyngeal(NP) swabs in vial transport medium  Result Value Ref Range Status   SARS Coronavirus 2 by RT PCR NEGATIVE NEGATIVE Final    Comment: (NOTE) SARS-CoV-2 target nucleic acids are NOT DETECTED.  The SARS-CoV-2 RNA is generally detectable in upper respiratory specimens during the acute phase of infection. The lowest concentration of SARS-CoV-2 viral copies this assay can detect is 138 copies/mL. A negative result does not  preclude SARS-Cov-2 infection and should not be used as the sole basis for treatment or other patient management decisions. A negative result may occur with  improper specimen collection/handling, submission of specimen other than nasopharyngeal swab, presence of viral mutation(s) within the areas targeted by this assay, and inadequate number of viral copies(<138 copies/mL). A negative result must be combined with clinical observations, patient history, and epidemiological information. The expected result is Negative.  Fact Sheet for Patients:  BloggerCourse.com  Fact Sheet for Healthcare Providers:  SeriousBroker.it  This test is no t yet approved or cleared by the [place] FDA and  has been authorized for detection and/or diagnosis of SARS-CoV-2 by FDA under an Emergency Use Authorization (EUA). This EUA will remain  in effect (meaning this test can be used) for the duration of the COVID-19 declaration under Section 564(b)(1) of the Act, 21 U.S.C.section 360bbb-3(b)(1), unless the authorization is terminated  or revoked sooner.       Influenza A by PCR NEGATIVE NEGATIVE Final   Influenza B by PCR NEGATIVE NEGATIVE Final    Comment: (NOTE) The Xpert Xpress SARS-CoV-2/FLU/RSV plus assay is intended as an aid in the diagnosis of influenza from Nasopharyngeal swab specimens and should not be used as a sole basis for treatment. Nasal washings and aspirates are unacceptable for Xpert Xpress SARS-CoV-2/FLU/RSV testing.  Fact Sheet for Patients: BloggerCourse.com  Fact Sheet for Healthcare Providers: SeriousBroker.it  This test is not yet approved or cleared by the [place] FDA and has been authorized for detection and/or diagnosis of SARS-CoV-2 by FDA under an Emergency Use Authorization (EUA). This EUA will remain in effect (meaning this test can be used) for the  duration of the COVID-19 declaration under Section 564(b)(1) of the Act, 21 U.S.C. section 360bbb-3(b)(1), unless the authorization is terminated or revoked.  Performed at [facility], [address] [place], [place] [address]   Urine Culture     Status: None   Collection Time: 02/20/21  5:43 PM   Specimen: In/Out Cath Urine  Result Value Ref Range Status   Specimen Description   Final    IN/OUT CATH URINE Performed at [facility], [address], [place], [place] [address]    Special Requests   Final    NONE Performed at [facility], [address] [place], [place] [address]    Culture   Final    NO GROWTH Performed at [facility], [address], [place],  [place] [address]    Report Status 02/21/2021 FINAL  Final  Blood Culture (routine x 2)     Status: None (Preliminary result)   Collection Time: 02/20/21  5:53 PM   Specimen: BLOOD  Result Value Ref Range Status   Specimen Description BLOOD BLOOD LEFT FOREARM  Final   Special Requests   Final    BOTTLES DRAWN AEROBIC AND ANAEROBIC Blood Culture adequate volume   Culture   Final    NO GROWTH 3 DAYS Performed at [facility], [address] [place], [place] [address]    Report Status PENDING  Incomplete  MRSA Next Gen by PCR, Nasal     Status: None   Collection Time: 02/20/21 10:41 PM   Specimen: Nasal Mucosa; Nasal Swab  Result Value Ref Range Status   MRSA by PCR Next Gen NOT DETECTED NOT DETECTED Final    Comment: (NOTE) The GeneXpert MRSA Assay (FDA approved for NASAL specimens only), is one component of a comprehensive MRSA colonization surveillance program. It is not intended to diagnose MRSA infection nor to guide or monitor treatment for MRSA infections. Test performance is not FDA approved in patients less than 75 years old. Performed at [facility], [address] [place], [place] [address]   Respiratory (~20 pathogens) panel by PCR      Status: None   Collection Time: 02/21/21 10:06 AM   Specimen: Nasopharyngeal Swab; Respiratory  Result Value Ref Range Status   Adenovirus NOT DETECTED NOT DETECTED Final   Coronavirus 229E NOT DETECTED NOT DETECTED Final    Comment: (NOTE) The Coronavirus on the Respiratory Panel, DOES NOT test for the novel  Coronavirus (2019 nCoV)    Coronavirus HKU1 NOT DETECTED NOT DETECTED Final   Coronavirus NL63 NOT DETECTED NOT DETECTED Final   Coronavirus OC43 NOT DETECTED NOT DETECTED Final   Metapneumovirus NOT DETECTED NOT DETECTED Final   Rhinovirus / Enterovirus NOT DETECTED NOT DETECTED Final   Influenza A NOT DETECTED NOT DETECTED Final   Influenza B NOT DETECTED NOT DETECTED Final   Parainfluenza Virus 1 NOT DETECTED NOT DETECTED Final   Parainfluenza Virus 2 NOT DETECTED NOT DETECTED Final   Parainfluenza Virus 3 NOT DETECTED NOT DETECTED Final   Parainfluenza Virus 4 NOT DETECTED NOT DETECTED Final   Respiratory Syncytial Virus NOT DETECTED NOT DETECTED Final   Bordetella pertussis NOT DETECTED NOT DETECTED Final   Bordetella Parapertussis NOT DETECTED NOT DETECTED Final   Chlamydophila pneumoniae NOT DETECTED NOT DETECTED Final   Mycoplasma pneumoniae NOT DETECTED NOT DETECTED Final    Comment: Performed at [facility], [address], [place], [place] [address]  Body fluid culture w Gram Stain     Status: None (Preliminary result)   Collection Time: 02/21/21  4:39 PM   Specimen: PATH Cytology Pleural fluid  Result Value Ref Range Status   Specimen Description   Final    PLEURAL Performed at [facility], [address], [place], [place] [address]    Special Requests   Final    NONE Performed at [facility], [address] [place], [place] [address]    Gram Stain   Final    WBC PRESENT, PREDOMINANTLY MONONUCLEAR NO ORGANISMS SEEN    Culture   Final    NO GROWTH 2 DAYS Performed at [facility], [address] [place], [place]  [address]    Report Status PENDING  Incomplete  Radiology Studies: DG Chest Port 1 View  Result Date: 02/21/2021 CLINICAL DATA:  Post thoracentesis EXAM: PORTABLE CHEST 1 VIEW COMPARISON:  02/20/2021, CT 02/21/2021 FINDINGS: Decreased right pleural effusion with small residual post thoracentesis. No pneumothorax is seen. Cardiomegaly with vascular congestion. Patchy airspace disease in the left mid lung and right base. IMPRESSION: 1. Decreased right pleural effusion post thoracentesis without visible pneumothorax 2. Hazy left mid lung and right basilar opacity may reflect atelectasis or pneumonia. Electronically Signed   By: Jasmine Pang M.D.   On: 02/21/2021 16:51        Scheduled Meds:  atorvastatin  20 mg Oral QHS   budesonide  0.5 mg Inhalation Daily   diltiazem  240 mg Oral Daily   dofetilide  125 mcg Oral BID   finasteride  5 mg Oral Daily   folic acid  1 mg Oral Daily   insulin aspart  0-15 Units Subcutaneous Q4H   insulin glargine-yfgn  12 Units Subcutaneous QHS   pantoprazole  40 mg Oral Daily   potassium chloride SA  20 mEq Oral Daily   rivaroxaban  20 mg Oral Q supper   senna-docusate  2 tablet Oral QHS   tamsulosin  0.4 mg Oral Daily   tiotropium  18 mcg Inhalation Daily   Continuous Infusions:  sodium chloride Stopped (02/22/21 1516)   piperacillin-tazobactam (ZOSYN)  IV 3.375 g (02/23/21 0536)     LOS: 3 days    Time spent: 20 min    Silvano Bilis, MD [facility]   If 7PM-7AM, please contact night-coverage [email or web address] Password [email or web address] 02/23/2021, 1:04 PM

## 2021-02-24 DIAGNOSIS — J189 Pneumonia, unspecified organism: Secondary | ICD-10-CM | POA: Diagnosis not present

## 2021-02-24 LAB — BASIC METABOLIC PANEL
Anion gap: 8 (ref 5–15)
BUN: 26 mg/dL — ABNORMAL HIGH (ref 8–23)
CO2: 31 mmol/L (ref 22–32)
Calcium: 8.2 mg/dL — ABNORMAL LOW (ref 8.9–10.3)
Chloride: 99 mmol/L (ref 98–111)
Creatinine, Ser: 1.24 mg/dL (ref 0.61–1.24)
GFR, Estimated: 58 mL/min — ABNORMAL LOW (ref >60)
Glucose, Bld: 101 mg/dL — ABNORMAL HIGH (ref 70–99)
Potassium: 3.8 mmol/L (ref 3.5–5.1)
Sodium: 138 mmol/L (ref 135–145)

## 2021-02-24 LAB — GLUCOSE, CAPILLARY
Glucose-Capillary: 131 mg/dL — ABNORMAL HIGH (ref 70–99)
Glucose-Capillary: 107 mg/dL — ABNORMAL HIGH (ref 70–99)
Glucose-Capillary: 96 mg/dL (ref 70–99)
Glucose-Capillary: 187 mg/dL — ABNORMAL HIGH (ref 70–99)
Glucose-Capillary: 174 mg/dL — ABNORMAL HIGH (ref 70–99)
Glucose-Capillary: 193 mg/dL — ABNORMAL HIGH (ref 70–99)

## 2021-02-24 LAB — CBC
HCT: 32.4 % — ABNORMAL LOW (ref 39.0–52.0)
Hemoglobin: 10.2 g/dL — ABNORMAL LOW (ref 13.0–17.0)
MCH: 29.7 pg (ref 26.0–34.0)
MCHC: 31.5 g/dL (ref 30.0–36.0)
MCV: 94.2 fL (ref 80.0–100.0)
Platelets: 258 10*3/uL (ref 150–400)
RBC: 3.44 MIL/uL — ABNORMAL LOW (ref 4.22–5.81)
RDW: 13.4 % (ref 11.5–15.5)
WBC: 5.9 10*3/uL (ref 4.0–10.5)
nRBC: 0 % (ref 0.0–0.2)

## 2021-02-24 LAB — MAGNESIUM: Magnesium: 2.2 mg/dL (ref 1.7–2.4)

## 2021-02-24 MED ORDER — PIPERACILLIN-TAZOBACTAM 3.375 G IVPB
3.3750 g | Freq: Three times a day (TID) | INTRAVENOUS | Status: AC
  Administered 2021-02-24: 3.375 g via INTRAVENOUS
  Filled 2021-02-24: qty 50

## 2021-02-24 NOTE — Evaluation (Signed)
Physical Therapy Evaluation Patient Details Name: Samuel Thomas MRN: [id number] DOB: [birth year] Today's Date: 02/24/2021  History of Present Illness  Pt is a 85 y.o. male with medical history significant for DM, HTN, pumonary HTN, diastolic heart failure, paroxysmal A. fib, thyroid nodules, iron deficiency anemia, frequent falls, COPD, hearing impairment chronic opioid use, recently hospitalized for pneumonia from 10/27-11/4 and discharged to rehab , who presents to the ED from rehab due to blood sugar in the 500s for several days and altered mental status described as increased confusion. MD assessment includes: healthcare associated pneumonia, sepsis, pleural effusion, acute metabolic encephalopathy, debility, acute hypoxic respiratory failure, a fib with RVR, acute kidney injury, anemia, and HFpEF.   Clinical Impression  Pt was pleasant and motivated to participate during the session and put forth good effort throughout. Pt's SpO2 and HR remained WNL during the entire session. Pt was able to complete all ther ex in bed with cuing to continue exercises. Pt required min assist with bed mobility for LE control and cuing for hand placement and needed education on log rolling. Pt required min guard for safety for STS from EOB. Pt was able to ambulate a max of 30ft due to fatigue and demonstrated a decreased step length and cadence. Pt will benefit from PT services in a SNF setting upon discharge to safely address deficits listed in patient problem list for decreased caregiver assistance and eventual return to PLOF.   Recommendations for follow up therapy are one component of a multi-disciplinary discharge planning process, led by the attending physician.  Recommendations may be updated based on patient status, additional functional criteria and insurance authorization.  Follow Up Recommendations Skilled nursing-short term rehab (<3 hours/day)    Assistance Recommended at Discharge Frequent or constant  Supervision/Assistance  Functional Status Assessment Patient has had a recent decline in their functional status and demonstrates the ability to make significant improvements in function in a reasonable and predictable amount of time.  Equipment Recommendations  None recommended by PT    Recommendations for Other Services       Precautions / Restrictions Precautions Precautions: Fall Restrictions Weight Bearing Restrictions: No      Mobility  Bed Mobility Overal bed mobility: Needs Assistance Bed Mobility: Supine to Sit     Supine to sit: Min assist     General bed mobility comments: Increased time and effort, required cuing on rolling in bed for hand placement and needed assistance for bilat LE control    Transfers Overall transfer level: Needs assistance Equipment used: Rolling walker (2 wheels) Transfers: Sit to/from Stand Sit to Stand: Min guard           General transfer comment: Did not require cuing for STS, min guard for safety    Ambulation/Gait Ambulation/Gait assistance: Min guard Gait Distance (Feet): 30 Feet Assistive device: Rolling walker (2 wheels) Gait Pattern/deviations: Step-through pattern;Decreased stride length;Decreased step length - right;Decreased step length - left Gait velocity: decreased     General Gait Details: Pt required min guard for safety during ambulation, demonstrated good understanding of directional cuing, was limited by fatigue, demonstrated decreased cadence and tolerance to ambulation  Stairs            Wheelchair Mobility    Modified Rankin (Stroke Patients Only)       Balance Overall balance assessment: Needs assistance Sitting-balance support: No upper extremity supported;Feet supported Sitting balance-Leahy Scale: Good     Standing balance support: Bilateral upper extremity supported;During functional activity Standing balance-Leahy  Scale: Fair Standing balance comment: Required min gaurd for  safety                             Pertinent Vitals/Pain Pain Assessment: 0-10 Pain Score: 7  Pain Location: chronic back pain Pain Descriptors / Indicators: Discomfort Pain Intervention(s): Repositioned    Home Living Family/patient expects to be discharged to:: Private residence Living Arrangements: Alone Available Help at Discharge: Family;Available PRN/intermittently (Son and daughter) Type of Home: Apartment Home Access: Stairs to enter   Secretary/administrator of Steps: Flight with rail   Home Layout: One level Home Equipment: Shower seat;Grab bars - tub/shower;Cane - single Librarian, academic (2 wheels)      Prior Function Prior Level of Function : History of Falls (last six months);Needs assist             Mobility Comments: Uses RW for functional mobility, reports having "walking stick" that he uses to go up and down stairs, pt states has had multiple falls on stairs into/out of his apartment. pt reports having fallen 6 times in the past 6 months ADLs Comments: Pt reports he did not require assistance     Hand Dominance        Extremity/Trunk Assessment   Upper Extremity Assessment Upper Extremity Assessment: Generalized weakness    Lower Extremity Assessment Lower Extremity Assessment: Generalized weakness       Communication   Communication: HOH  Cognition Arousal/Alertness: Awake/alert Behavior During Therapy: WFL for tasks assessed/performed Overall Cognitive Status: Within Functional Limits for tasks assessed                                          General Comments      Exercises Total Joint Exercises Ankle Circles/Pumps: AROM;Strengthening;Both;10 reps;Supine Quad Sets: AROM;Strengthening;Both;10 reps;Supine Long Arc Quad: AROM;Strengthening;Both;10 reps;Seated Marching in Standing: AROM;Strengthening;Both;10 reps;Standing General Exercises - Lower Extremity Heel Slides: AROM;Strengthening;10  reps;Supine Other Exercises Other Exercises: Pt education on log rolling in bed   Assessment/Plan    PT Assessment Patient needs continued PT services  PT Problem List Decreased strength;Decreased mobility;Decreased safety awareness;Decreased activity tolerance;Decreased balance;Decreased knowledge of use of DME;Decreased range of motion       PT Treatment Interventions DME instruction;Therapeutic activities;Therapeutic exercise;Gait training;Patient/family education;Stair training;Balance training;Functional mobility training    PT Goals (Current goals can be found in the Care Plan section)  Acute Rehab PT Goals Patient Stated Goal: Get better and to get up and walking again PT Goal Formulation: With patient Time For Goal Achievement: 03/09/21 Potential to Achieve Goals: Good    Frequency Min 2X/week   Barriers to discharge        Co-evaluation               AM-PAC PT "6 Clicks" Mobility  Outcome Measure Help needed turning from your back to your side while in a flat bed without using bedrails?: A Little Help needed moving from lying on your back to sitting on the side of a flat bed without using bedrails?: A Little Help needed moving to and from a bed to a chair (including a wheelchair)?: A Lot Help needed standing up from a chair using your arms (e.g., wheelchair or bedside chair)?: A Little Help needed to walk in hospital room?: A Little Help needed climbing 3-5 steps with a railing? : A Lot 6 Click  Score: 16    End of Session Equipment Utilized During Treatment: Gait belt Activity Tolerance: Patient tolerated treatment well;Other (comment) (Pt needed to have a BM) Patient left: Other (comment) (Left on toilet for directions to use red chord for assitance when finished) Nurse Communication: Mobility status;Other (comment) (Pt left in bathroom for BM) PT Visit Diagnosis: Muscle weakness (generalized) (M62.81);Unsteadiness on feet (R26.81);History of falling  (Z91.81);Difficulty in walking, not elsewhere classified (R26.2)    Time: 1517-6160 PT Time Calculation (min) (ACUTE ONLY): 38 min   Charges:   PT Evaluation $PT Eval Moderate Complexity: 1 Mod PT Treatments $Therapeutic Exercise: 8-22 mins        Hildred Alamin SPT 02/24/21, 1:56 PM

## 2021-02-24 NOTE — Progress Notes (Signed)
Inpatient Diabetes Program Recommendations  AACE/ADA: New Consensus Statement on Inpatient Glycemic Control   Target Ranges:  Prepandial:   less than 140 mg/dL      [facility] postprandial:   less than 180 mg/dL (1-2 hours)      Critically ill patients:  140 - 180 mg/dL   Results for Samuel Thomas, Samuel Thomas (MRN [id number]) as of 02/24/2021 11:02  Ref. Range 02/24/2021 01:07 02/24/2021 04:33 02/24/2021 08:20  Glucose-Capillary Latest Ref Range: 70 - 99 mg/dL 945 (H) 038 (H) 96   Results for Samuel Thomas, Samuel Thomas (MRN [id number]) as of 02/24/2021 11:02  Ref. Range 02/23/2021 08:18 02/23/2021 11:22 02/23/2021 16:39 02/23/2021 20:24  Glucose-Capillary Latest Ref Range: 70 - 99 mg/dL 179 (H) 150 (H) 569 (H) 243 (H)   Review of Glycemic Control  Diabetes history: DM2 Outpatient Diabetes medications: Lantus 10 units daily, Humalog 6 units TID with meals, Metformin 1000 mg BID Current orders for Inpatient glycemic control: Semglee 16 units QHS, Novolog 0-15 units Q4H   Inpatient Diabetes Program Recommendations:    Diet: If appropriate, please consider adding Carb modified to Dys 3 diet as postprandial glucose is consistently elevated.  Thanks, Orlando Penner, RN, MSN, CDE Diabetes Coordinator Inpatient Diabetes Program [phone] (Team Pager from 8am to 5pm)

## 2021-02-24 NOTE — Progress Notes (Signed)
PROGRESS NOTE    Samuel Thomas  TIW:[id number] DOB: 1935/11/02 DOA: 02/20/2021 PCP: Jenell Milliner, MD  Outpatient Specialists: cardiology    Brief Narrative:   From admission hpi Samuel Thomas is a 85 y.o. male with medical history significant for DM, HTN, diastolic heart failure, paroxysmal A. fib on Xarelto and Tikosyn, thyroid nodules, iron deficiency anemia, frequent falls, COPD, hearing impairment chronic opioid use, recently hospitalized for pneumonia from 10/27-11/4 and discharged to rehab , who presents to the ED from rehab due to blood sugar in the 500s for several days and altered mental status described as increased confusion.  History obtained mostly from ER provider and ER documentation due to patient's confusion.  O2 sat 85% on room air   Assessment & Plan:   Active Problems:   Chronic diastolic heart failure (HCC)   Essential hypertension   AKI (acute kidney injury) (HCC)   Chronic, continuous use of opioids   COPD (chronic obstructive pulmonary disease) (HCC)   Atrial fibrillation with RVR (HCC)   Polypharmacy   HCAP (healthcare-associated pneumonia)   Hyperglycemia due to type 2 diabetes mellitus (HCC)   Acute on chronic anemia   Acute respiratory failure with hypercapnia (HCC)   Acute metabolic encephalopathy   Severe sepsis (HCC)  # Healthcare associated pneumonia 2/2 strep pneumonia # Sepsis # Pleural effusion # COPD Sepsis criteria by tachycardia, tachypnea, elevated lactic acid, encephalopathy. Hypoxic on arrival. CXR with R lung disease with effusion, malignancy not excluded. Treated with cephalosporin and azithromycin for CAP earlier this month. S/p fluid resuscitation. Currently hemodynamically stable, sepsis physiology resolved Covid/flu neg. CT with large effusion s/p IR 2 L paracentesis 11/11. Exudative by light's criteria but pulm thinks more likely transudate. Culture ngtd. Urine strep pneumo antigen positive. Respiratory panel neg. Respiratory  status improved, now weaned off o2 - stop zosyn, is now s/p 5 days tx - f/u cultures, ngtd - pulm advises further w/u if effusion recurs. Consider cxr in several weeks as outpatient  # Acute metabolic encephalopathy Likely 2/2 infection, hypoxia. CTH neg. Resolved - treating as above - hold home gabapentin  # Debility Here from SNF - pt consult, will return to SNF  # Acute hypoxic respiratory failure O2 in mid 80s on arrival, normalized on 3 L, now weaned off O2  # A fib with RVR RVR resolved - cont home dilt, tikosyn - xarelto  # Acute kidney injury Resolved w/ fluid resuscitation  # Anemia Likely 2/2 chronic disease. Stable hgb in the 9s - monitor  # T2DM Controlled - lantus 16; cont SSI  # HFpEF Appears euvolemic - home furosemide  # HTN Here bp mildly elevated - cont home diltiazem, atorvastatin  # BPH Home proscar, flomax   DVT prophylaxis: xarelto Code Status: DNR Family Communication: grandson and poa david updated 11/13. No answer when called today  Level of care: Progressive Status is: Inpatient  Remains inpatient appropriate because: severity of illness   Consultants:  none  Procedures: none  Antimicrobials:  S/p 5 days zosyn   Subjective: Awake and alert, tolerating diet. Denies cough/dyspnea. Says breathing feels improved  Objective: Vitals:   02/24/21 0207 02/24/21 0435 02/24/21 0912 02/24/21 1111  BP:  132/63  137/86  Pulse:  69  82  Resp:  18  18  Temp:  98.2 F (36.8 C)  98.1 F (36.7 C)  TempSrc:  Oral    SpO2:  100% 92% 94%  Weight: 79 kg     Height:  Intake/Output Summary (Last 24 hours) at 02/24/2021 1334 Last data filed at 02/24/2021 1000 Gross per 24 hour  Intake 486.62 ml  Output 2250 ml  Net -1763.38 ml   Filed Weights   02/20/21 2000 02/21/21 0830 02/24/21 0207  Weight: 80.4 kg 81 kg 79 kg    Examination:  General exam: Appears calm and comfortable,  Respiratory system: rales at bases, no  wheeze Cardiovascular system: irreg irreg, mild systolic murmur, no edema Gastrointestinal system: Abdomen is nondistended, soft and nontender. No organomegaly or masses felt. Normal bowel sounds heard. Central nervous system: awake and alert, moves all 4 extremities Extremities: b/l le edema Skin: hyperpigmentation lower calves Psychiatry: calm, confused    Data Reviewed: I have personally reviewed following labs and imaging studies  CBC: Recent Labs  Lab 02/20/21 1743 02/21/21 0621 02/22/21 0547 02/23/21 0505 02/24/21 0502  WBC 7.7 5.5 6.0 5.8 5.9  NEUTROABS 6.6  --   --   --   --   HGB 10.1* 8.2* 9.1* 9.2* 10.2*  HCT 32.3* 27.2* 28.8* 28.4* 32.4*  MCV 99.7 99.3 95.4 95.0 94.2  PLT 230 195 238 239 258   Basic Metabolic Panel: Recent Labs  Lab 02/20/21 1743 02/21/21 0746 02/22/21 0547 02/23/21 0505 02/24/21 0502  NA 138 141 142 140 138  K 4.6 4.6 4.2 4.2 3.8  CL 98 104 104 104 99  CO2 34* 29 31 31 31   GLUCOSE 345* 155* 178* 187* 101*  BUN 25* 20 20 26* 26*  CREATININE 1.38* 1.16 1.12 1.08 1.24  CALCIUM 8.5* 8.1* 8.5* 8.4* 8.2*  MG  --   --  2.0 2.4 2.2   GFR: Estimated Creatinine Clearance: 42.2 mL/min (by C-G formula based on SCr of 1.24 mg/dL). Liver Function Tests: Recent Labs  Lab 02/20/21 1743 02/21/21 1527  AST 19  --   ALT 17  --   ALKPHOS 82  --   BILITOT 0.5  --   PROT 6.4* 6.0*  ALBUMIN 2.6*  --    No results for input(s): LIPASE, AMYLASE in the last 168 hours. No results for input(s): AMMONIA in the last 168 hours. Coagulation Profile: Recent Labs  Lab 02/20/21 1743 02/21/21 0621  INR 1.4* 1.3*   Cardiac Enzymes: No results for input(s): CKTOTAL, CKMB, CKMBINDEX, TROPONINI in the last 168 hours. BNP (last 3 results) No results for input(s): PROBNP in the last 8760 hours. HbA1C: No results for input(s): HGBA1C in the last 72 hours. CBG: Recent Labs  Lab 02/23/21 2024 02/24/21 0107 02/24/21 0433 02/24/21 0820 02/24/21 1113   GLUCAP 243* 131* 107* 96 187*   Lipid Profile: No results for input(s): CHOL, HDL, LDLCALC, TRIG, CHOLHDL, LDLDIRECT in the last 72 hours. Thyroid Function Tests: No results for input(s): TSH, T4TOTAL, FREET4, T3FREE, THYROIDAB in the last 72 hours. Anemia Panel: No results for input(s): VITAMINB12, FOLATE, FERRITIN, TIBC, IRON, RETICCTPCT in the last 72 hours. Urine analysis:    Component Value Date/Time   COLORURINE YELLOW (A) 02/20/2021 1743   APPEARANCEUR CLEAR (A) 02/20/2021 1743   LABSPEC 1.010 02/20/2021 1743   PHURINE 5.0 02/20/2021 1743   GLUCOSEU 150 (A) 02/20/2021 1743   HGBUR NEGATIVE 02/20/2021 1743   BILIRUBINUR NEGATIVE 02/20/2021 1743   KETONESUR NEGATIVE 02/20/2021 1743   PROTEINUR NEGATIVE 02/20/2021 1743   UROBILINOGEN 0.2 12/01/2013 2020   NITRITE NEGATIVE 02/20/2021 1743   LEUKOCYTESUR NEGATIVE 02/20/2021 1743   Sepsis Labs: @LABRCNTIP (procalcitonin:4,lacticidven:4)  ) Recent Results (from the past 240 hour(s))  Blood Culture (  routine x 2)     Status: None (Preliminary result)   Collection Time: 02/20/21  5:33 PM   Specimen: BLOOD  Result Value Ref Range Status   Specimen Description BLOOD LEFT ANTECUBITAL  Final   Special Requests   Final    BOTTLES DRAWN AEROBIC AND ANAEROBIC Blood Culture adequate volume   Culture   Final    NO GROWTH 4 DAYS Performed at [facility], [address] [place], [place] [address]    Report Status PENDING  Incomplete  Resp Panel by RT-PCR (Flu A&B, Covid) Nasopharyngeal Swab     Status: None   Collection Time: 02/20/21  5:43 PM   Specimen: Nasopharyngeal Swab; Nasopharyngeal(NP) swabs in vial transport medium  Result Value Ref Range Status   SARS Coronavirus 2 by RT PCR NEGATIVE NEGATIVE Final    Comment: (NOTE) SARS-CoV-2 target nucleic acids are NOT DETECTED.  The SARS-CoV-2 RNA is generally detectable in upper respiratory specimens during the acute phase of infection. The lowest concentration of  SARS-CoV-2 viral copies this assay can detect is 138 copies/mL. A negative result does not preclude SARS-Cov-2 infection and should not be used as the sole basis for treatment or other patient management decisions. A negative result may occur with  improper specimen collection/handling, submission of specimen other than nasopharyngeal swab, presence of viral mutation(s) within the areas targeted by this assay, and inadequate number of viral copies(<138 copies/mL). A negative result must be combined with clinical observations, patient history, and epidemiological information. The expected result is Negative.  Fact Sheet for Patients:  BloggerCourse.com  Fact Sheet for Healthcare Providers:  SeriousBroker.it  This test is no t yet approved or cleared by the [place] FDA and  has been authorized for detection and/or diagnosis of SARS-CoV-2 by FDA under an Emergency Use Authorization (EUA). This EUA will remain  in effect (meaning this test can be used) for the duration of the COVID-19 declaration under Section 564(b)(1) of the Act, 21 U.S.C.section 360bbb-3(b)(1), unless the authorization is terminated  or revoked sooner.       Influenza A by PCR NEGATIVE NEGATIVE Final   Influenza B by PCR NEGATIVE NEGATIVE Final    Comment: (NOTE) The Xpert Xpress SARS-CoV-2/FLU/RSV plus assay is intended as an aid in the diagnosis of influenza from Nasopharyngeal swab specimens and should not be used as a sole basis for treatment. Nasal washings and aspirates are unacceptable for Xpert Xpress SARS-CoV-2/FLU/RSV testing.  Fact Sheet for Patients: BloggerCourse.com  Fact Sheet for Healthcare Providers: SeriousBroker.it  This test is not yet approved or cleared by the [place] FDA and has been authorized for detection and/or diagnosis of SARS-CoV-2 by FDA under an Emergency Use  Authorization (EUA). This EUA will remain in effect (meaning this test can be used) for the duration of the COVID-19 declaration under Section 564(b)(1) of the Act, 21 U.S.C. section 360bbb-3(b)(1), unless the authorization is terminated or revoked.  Performed at [facility], [address] [place], [place] [address]   Urine Culture     Status: None   Collection Time: 02/20/21  5:43 PM   Specimen: In/Out Cath Urine  Result Value Ref Range Status   Specimen Description   Final    IN/OUT CATH URINE Performed at [facility], [address] [place], [place] [address]    Special Requests   Final    NONE Performed at [facility], [address], [place], [place] [address]    Culture  Final    NO GROWTH Performed at [facility], [address], [place], [place] [address]    Report Status 02/21/2021 FINAL  Final  Blood Culture (routine x 2)     Status: None (Preliminary result)   Collection Time: 02/20/21  5:53 PM   Specimen: BLOOD  Result Value Ref Range Status   Specimen Description BLOOD BLOOD LEFT FOREARM  Final   Special Requests   Final    BOTTLES DRAWN AEROBIC AND ANAEROBIC Blood Culture adequate volume   Culture   Final    NO GROWTH 4 DAYS Performed at [facility], [address], [place], [place] [address]    Report Status PENDING  Incomplete  MRSA Next Gen by PCR, Nasal     Status: None   Collection Time: 02/20/21 10:41 PM   Specimen: Nasal Mucosa; Nasal Swab  Result Value Ref Range Status   MRSA by PCR Next Gen NOT DETECTED NOT DETECTED Final    Comment: (NOTE) The GeneXpert MRSA Assay (FDA approved for NASAL specimens only), is one component of a comprehensive MRSA colonization surveillance program. It is not intended to diagnose MRSA infection nor to guide or monitor treatment for MRSA infections. Test performance is not FDA approved in patients less than 94 years old. Performed at [facility], [address] [place], [place] [address]   Respiratory (~20 pathogens) panel by PCR     Status: None   Collection Time: 02/21/21 10:06 AM   Specimen: Nasopharyngeal Swab; Respiratory  Result Value Ref Range Status   Adenovirus NOT DETECTED NOT DETECTED Final   Coronavirus 229E NOT DETECTED NOT DETECTED Final    Comment: (NOTE) The Coronavirus on the Respiratory Panel, DOES NOT test for the novel  Coronavirus (2019 nCoV)    Coronavirus HKU1 NOT DETECTED NOT DETECTED Final   Coronavirus NL63 NOT DETECTED NOT DETECTED Final   Coronavirus OC43 NOT DETECTED NOT DETECTED Final   Metapneumovirus NOT DETECTED NOT DETECTED Final   Rhinovirus / Enterovirus NOT DETECTED NOT DETECTED Final   Influenza A NOT DETECTED NOT DETECTED Final   Influenza B NOT DETECTED NOT DETECTED Final   Parainfluenza Virus 1 NOT DETECTED NOT DETECTED Final   Parainfluenza Virus 2 NOT DETECTED NOT DETECTED Final   Parainfluenza Virus 3 NOT DETECTED NOT DETECTED Final   Parainfluenza Virus 4 NOT DETECTED NOT DETECTED Final   Respiratory Syncytial Virus NOT DETECTED NOT DETECTED Final   Bordetella pertussis NOT DETECTED NOT DETECTED Final   Bordetella Parapertussis NOT DETECTED NOT DETECTED Final   Chlamydophila pneumoniae NOT DETECTED NOT DETECTED Final   Mycoplasma pneumoniae NOT DETECTED NOT DETECTED Final    Comment: Performed at [facility], [address], [place], [place] [address]  Body fluid culture w Gram Stain     Status: None (Preliminary result)   Collection Time: 02/21/21  4:39 PM   Specimen: PATH Cytology Pleural fluid  Result Value Ref Range Status   Specimen Description   Final    PLEURAL Performed at [facility], [address] [place], [place] [address]    Special Requests   Final    NONE Performed at [facility], [address] [place], [place] [address]    Gram Stain   Final    WBC PRESENT, PREDOMINANTLY MONONUCLEAR NO ORGANISMS SEEN    Culture   Final     NO GROWTH 3 DAYS Performed at [facility], [address], [place],  [place] [address]    Report Status PENDING  Incomplete         Radiology Studies: No results found.      Scheduled Meds:  atorvastatin  20 mg Oral QHS   budesonide  0.5 mg Inhalation Daily   diltiazem  240 mg Oral Daily   dofetilide  125 mcg Oral BID   finasteride  5 mg Oral Daily   folic acid  1 mg Oral Daily   furosemide  40 mg Oral Daily   insulin aspart  0-15 Units Subcutaneous Q4H   insulin glargine-yfgn  16 Units Subcutaneous QHS   pantoprazole  40 mg Oral Daily   potassium chloride SA  20 mEq Oral Daily   rivaroxaban  20 mg Oral Q supper   senna-docusate  2 tablet Oral QHS   tamsulosin  0.4 mg Oral Daily   tiotropium  18 mcg Inhalation Daily   Continuous Infusions:  sodium chloride Stopped (02/22/21 1516)   piperacillin-tazobactam (ZOSYN)  IV 3.375 g (02/24/21 1205)     LOS: 4 days    Time spent: 20 min    Silvano Bilis, MD [facility]   If 7PM-7AM, please contact night-coverage [email or web address] Password [email or web address] 02/24/2021, 1:34 PM

## 2021-02-24 NOTE — TOC Initial Note (Signed)
Transition of Care [facility]) - Initial/Assessment Note    Patient Details  Name: Samuel Thomas MRN: [id number] Date of Birth: Jul 16, 1935  Transition of Care [facility]) CM/SW Contact:    Maree Krabbe, LCSW Phone Number: 02/24/2021, 2:10 PM  Clinical Narrative:   CSW spoke with pt's Grandson and he state pt came from [facility]. At this time pt's Lucila [place] is agreeable for pt to return. CSW has notified SNF and sent referral. CSW has started [place].                Expected Discharge Plan: Skilled Nursing Facility Barriers to Discharge: Continued Medical Work up   Patient Goals and CMS Choice Patient states their goals for this hospitalization and ongoing recovery are:: for pt to get better   Choice offered to / list presented to : Patient  Expected Discharge Plan and Services Expected Discharge Plan: Skilled Nursing Facility       Living arrangements for the past 2 months: Single Family Home                                      Prior Living Arrangements/Services Living arrangements for the past 2 months: Single Family Home Lives with:: Facility Resident Patient language and need for interpreter reviewed:: Yes Do you feel safe going back to the place where you live?: Yes      Need for Family Participation in Patient Care: Yes (Comment) Care giver support system in place?: Yes (comment)   Criminal Activity/Legal Involvement Pertinent to Current Situation/Hospitalization: No - Comment as needed  Activities of Daily Living      Permission Sought/Granted Permission sought to share information with : Family Supports Permission granted to share information with : Yes, Release of Information Signed  Share Information with NAME: david  Permission granted to share info w AGENCY: [facility]  Permission granted to share info w Relationship: grandson     Emotional Assessment Appearance:: Appears stated age Attitude/Demeanor/Rapport: Unable to Assess Affect (typically observed):  Unable to Assess Orientation: : Oriented to Place, Oriented to Self, Oriented to  Time Alcohol / Substance Use: Not Applicable Psych Involvement: No (comment)  Admission diagnosis:  Chronic respiratory failure with hypoxia (HCC) [J96.11] Severe sepsis (HCC) [A41.9, R65.20] Community acquired pneumonia, unspecified laterality [J18.9] Type 2 diabetes mellitus with hyperglycemia, unspecified whether long term insulin use (HCC) [E11.65] Sepsis, due to unspecified organism, unspecified whether acute organ dysfunction present [facility]) [A41.9] Patient Active Problem List   Diagnosis Date Noted   Hyperglycemia due to type 2 diabetes mellitus (HCC) 02/20/2021   Acute on chronic anemia 02/20/2021   Acute respiratory failure with hypercapnia (HCC) 02/20/2021   Acute metabolic encephalopathy 02/20/2021   Severe sepsis (HCC) 02/20/2021   HCAP (healthcare-associated pneumonia)    Fall 02/06/2021   Generalized weakness 02/06/2021   Tobacco abuse 02/06/2021   Pain due to onychomycosis of toenails of both feet 05/20/2020   History of gout 04/02/2020   Acute encephalopathy 10/27/2017   Syncope and collapse 10/27/2017   Incomplete emptying of bladder 07/20/2017   Overactive detrusor 07/20/2017   Atrial fibrillation with RVR (HCC) 07/01/2017   Hearing loss, sensorineural, asymmetrical 03/09/2017   At risk for falls 03/24/2016   Polypharmacy 03/24/2016   Benign fibroma of prostate 09/10/2015   Diastolic heart failure (HCC) 08/16/2015   Pulmonary hypertension (HCC) 08/16/2015   Cough 08/15/2015   Breath shortness 08/15/2015   BP (high  blood pressure) 07/24/2015   BPH with obstruction/lower urinary tract symptoms 07/02/2015   Diverticulitis large intestine 01/03/2015   AKI (acute kidney injury) (HCC) 01/02/2015   N&V (nausea and vomiting) 01/02/2015   Chronic pain associated with significant psychosocial dysfunction 10/04/2014   Chronic pain syndrome 10/04/2014   Encounter for monitoring opioid  maintenance therapy 08/29/2014   Arthritis 07/24/2014   Venous stasis ulcer of lower extremity (HCC) 07/24/2014   Chronic pain of right knee 07/03/2014   Diabetes mellitus (HCC) 05/17/2014   Obstructive apnea 04/25/2014   Drug-induced constipation 04/03/2014   Arthritis of knee, degenerative 01/30/2014   Bacterial skin infection of leg 01/16/2014   Chronic diastolic heart failure (HCC) 01/16/2014   Edema leg 01/16/2014   Cellulitis of lower extremity 01/16/2014   Chronic combined systolic and diastolic heart failure (HCC) 01/02/2014   Acute diastolic heart failure (HCC) 12/02/2013   NSVT (nonsustained ventricular tachycardia) 12/01/2013   SIRS (systemic inflammatory response syndrome) (HCC) 12/01/2013   Altered mental status 12/01/2013   Hypokalemia 12/01/2013   DM2 (diabetes mellitus, type 2) (HCC) 12/01/2013   HTN (hypertension) 12/01/2013   Hypoxia 12/01/2013   Chronic, continuous use of opioids 09/18/2013   Pain in shoulder 07/21/2013   Pain of right hand 05/24/2013   Bilateral hand pain 05/24/2013   Other long term (current) drug therapy 01/03/2013   High risk medication use 01/03/2013   Muscle ache 11/23/2012   Suicidal ideation 11/23/2012   Cellulitis 09/25/2012   COPD (chronic obstructive pulmonary disease) (HCC) 09/25/2012   Apnea, sleep 09/25/2012   Diabetes mellitus type 2 in obese (HCC) 08/15/2012   Hook nail 08/15/2012   Fungal infection of nail 08/15/2012   Arthropathy of lumbar facet joint 07/08/2012   Nerve root pain 07/07/2012   Cannabis abuse 07/07/2012   Marijuana use 07/07/2012   Status post cataract extraction 06/16/2012   Artificial lens present 06/16/2012   Other specified health status 05/10/2012   Depression 05/10/2012   Alcohol use 05/10/2012   Lumbar canal stenosis 03/17/2012   Degenerative arthritis of hip 03/17/2012   Adiposity 03/17/2012   H/O arthrodesis 03/17/2012   S/P cervical spinal fusion 03/17/2012   Arthralgia of hip or thigh  01/12/2012   Lumbosacral spondylosis 12/10/2011   Pain in thoracic spine 12/10/2011   Brachial neuritis 10/27/2011   Hypercholesteremia 08/25/2011   Hypercholesterolemia 08/25/2011   Cervical spinal stenosis 07/27/2011   Cervical spondylosis with myelopathy 07/17/2011   Difficulty walking 07/13/2011   Thoracic and lumbosacral neuritis 07/13/2011   Diverticulosis of colon 05/25/2011   History of colonic polyps 05/25/2011   Diverticular disease of large intestine 05/25/2011   Astigmatism 04/10/2011   Cataract, nuclear sclerotic senile 04/10/2011   Diabetes mellitus type 2, uncontrolled 04/10/2011   Bilateral low back pain without sciatica 03/31/2011   Allergic rhinitis 06/20/2002   Asthma 06/20/2002   Moderate persistent asthma without complication 06/20/2002   Coronary atherosclerosis 06/17/2000   Essential hypertension 06/17/2000   PCP:  Jenell Milliner, MD Pharmacy:   [facility] [address]), [place] - [address] [place]) [place] [address] Phone: [phone] Fax: [phone]     Social Determinants of Health (SDOH) Interventions    Readmission Risk Interventions Readmission Risk Prevention Plan 02/08/2021  Transportation Screening Complete  Medication Review (RN Care Manager) Complete  PCP or Specialist appointment within 3-5 days of discharge Complete  HRI or Home Care Consult Complete  SW Recovery Care/Counseling Consult Complete  Palliative Care Screening Not  Applicable  Skilled Nursing Facility Complete  Some recent data might be hidden

## 2021-02-24 NOTE — [place] FL2 (Signed)
[place] MEDICAID FL2 LEVEL OF CARE SCREENING TOOL     IDENTIFICATION  Patient Name: Samuel Thomas Birthdate: October 12, 1935 Sex: male Admission Date (Current Location): 02/20/2021  [facility] and [place] Number:  Chiropodist and Address:  [facility], [address] [place], [place] [address]      Provider Number: [id number]  Attending Physician Name and Address:  Kathrynn Running, MD  Relative Name and Phone Number:       Current Level of Care: Hospital Recommended Level of Care: Skilled Nursing Facility Prior Approval Number:    Date Approved/Denied:   PASRR Number:    Discharge Plan: SNF    Current Diagnoses: Patient Active Problem List   Diagnosis Date Noted   Hyperglycemia due to type 2 diabetes mellitus (HCC) 02/20/2021   Acute on chronic anemia 02/20/2021   Acute respiratory failure with hypercapnia (HCC) 02/20/2021   Acute metabolic encephalopathy 02/20/2021   Severe sepsis (HCC) 02/20/2021   HCAP (healthcare-associated pneumonia)    Fall 02/06/2021   Generalized weakness 02/06/2021   Tobacco abuse 02/06/2021   Pain due to onychomycosis of toenails of both feet 05/20/2020   History of gout 04/02/2020   Acute encephalopathy 10/27/2017   Syncope and collapse 10/27/2017   Incomplete emptying of bladder 07/20/2017   Overactive detrusor 07/20/2017   Atrial fibrillation with RVR (HCC) 07/01/2017   Hearing loss, sensorineural, asymmetrical 03/09/2017   At risk for falls 03/24/2016   Polypharmacy 03/24/2016   Benign fibroma of prostate 09/10/2015   Diastolic heart failure (HCC) 08/16/2015   Pulmonary hypertension (HCC) 08/16/2015   Cough 08/15/2015   Breath shortness 08/15/2015   BP (high blood pressure) 07/24/2015   BPH with obstruction/lower urinary tract symptoms 07/02/2015   Diverticulitis large intestine 01/03/2015   AKI (acute kidney injury) (HCC) 01/02/2015   N&V (nausea and vomiting) 01/02/2015   Chronic pain  associated with significant psychosocial dysfunction 10/04/2014   Chronic pain syndrome 10/04/2014   Encounter for monitoring opioid maintenance therapy 08/29/2014   Arthritis 07/24/2014   Venous stasis ulcer of lower extremity (HCC) 07/24/2014   Chronic pain of right knee 07/03/2014   Diabetes mellitus (HCC) 05/17/2014   Obstructive apnea 04/25/2014   Drug-induced constipation 04/03/2014   Arthritis of knee, degenerative 01/30/2014   Bacterial skin infection of leg 01/16/2014   Chronic diastolic heart failure (HCC) 01/16/2014   Edema leg 01/16/2014   Cellulitis of lower extremity 01/16/2014   Chronic combined systolic and diastolic heart failure (HCC) 01/02/2014   Acute diastolic heart failure (HCC) 12/02/2013   NSVT (nonsustained ventricular tachycardia) 12/01/2013   SIRS (systemic inflammatory response syndrome) (HCC) 12/01/2013   Altered mental status 12/01/2013   Hypokalemia 12/01/2013   DM2 (diabetes mellitus, type 2) (HCC) 12/01/2013   HTN (hypertension) 12/01/2013   Hypoxia 12/01/2013   Chronic, continuous use of opioids 09/18/2013   Pain in shoulder 07/21/2013   Pain of right hand 05/24/2013   Bilateral hand pain 05/24/2013   Other long term (current) drug therapy 01/03/2013   High risk medication use 01/03/2013   Muscle ache 11/23/2012   Suicidal ideation 11/23/2012   Cellulitis 09/25/2012   COPD (chronic obstructive pulmonary disease) (HCC) 09/25/2012   Apnea, sleep 09/25/2012   Diabetes mellitus type 2 in obese (HCC) 08/15/2012   Hook nail 08/15/2012   Fungal infection of nail 08/15/2012   Arthropathy of lumbar facet joint 07/08/2012   Nerve root pain 07/07/2012   Cannabis abuse 07/07/2012   Marijuana use 07/07/2012  Status post cataract extraction 06/16/2012   Artificial lens present 06/16/2012   Other specified health status 05/10/2012   Depression 05/10/2012   Alcohol use 05/10/2012   Lumbar canal stenosis 03/17/2012   Degenerative arthritis of hip  03/17/2012   Adiposity 03/17/2012   H/O arthrodesis 03/17/2012   S/P cervical spinal fusion 03/17/2012   Arthralgia of hip or thigh 01/12/2012   Lumbosacral spondylosis 12/10/2011   Pain in thoracic spine 12/10/2011   Brachial neuritis 10/27/2011   Hypercholesteremia 08/25/2011   Hypercholesterolemia 08/25/2011   Cervical spinal stenosis 07/27/2011   Cervical spondylosis with myelopathy 07/17/2011   Difficulty walking 07/13/2011   Thoracic and lumbosacral neuritis 07/13/2011   Diverticulosis of colon 05/25/2011   History of colonic polyps 05/25/2011   Diverticular disease of large intestine 05/25/2011   Astigmatism 04/10/2011   Cataract, nuclear sclerotic senile 04/10/2011   Diabetes mellitus type 2, uncontrolled 04/10/2011   Bilateral low back pain without sciatica 03/31/2011   Allergic rhinitis 06/20/2002   Asthma 06/20/2002   Moderate persistent asthma without complication 06/20/2002   Coronary atherosclerosis 06/17/2000   Essential hypertension 06/17/2000    Orientation RESPIRATION BLADDER Height & Weight     Self, Place, Time  Normal Incontinent Weight: 174 lb 2.6 oz (79 kg) Height:  5\' 7"  (170.2 cm)  BEHAVIORAL SYMPTOMS/MOOD NEUROLOGICAL BOWEL NUTRITION STATUS      Continent Diet (DYS 3 diet, nectar thick)  AMBULATORY STATUS COMMUNICATION OF NEEDS Skin   Limited Assist Verbally Normal                       Personal Care Assistance Level of Assistance  Bathing, Feeding, Dressing Bathing Assistance: Limited assistance Feeding assistance: Independent Dressing Assistance: Limited assistance     Functional Limitations Info  Sight, Hearing, Speech Sight Info: Adequate Hearing Info: Adequate Speech Info: Adequate    SPECIAL CARE FACTORS FREQUENCY  PT (By licensed PT), OT (By licensed OT)     PT Frequency: 5x OT Frequency: 5x            Contractures Contractures Info: Not present    Additional Factors Info  Code Status, Allergies Code Status Info:  DNR Allergies Info: Penicillins, Apixaban, Prednisone           Current Medications (02/24/2021):  This is the current hospital active medication list Current Facility-Administered Medications  Medication Dose Route Frequency Provider Last Rate Last Admin   0.9 %  sodium chloride infusion   Intravenous PRN Wouk, 02/26/2021, MD   Stopped at 02/22/21 1516   atorvastatin (LIPITOR) tablet 20 mg  20 mg Oral QHS 13/12/22 V, MD   20 mg at 02/23/21 2032   budesonide (PULMICORT) nebulizer solution 0.5 mg  0.5 mg Inhalation Daily 2033 V, MD   0.5 mg at 02/24/21 0911   diltiazem (CARDIZEM CD) 24 hr capsule 240 mg  240 mg Oral Daily 02/26/21, MD   240 mg at 02/24/21 0840   dofetilide (TIKOSYN) capsule 125 mcg  125 mcg Oral BID 02/26/21, MD   125 mcg at 02/24/21 0840   finasteride (PROSCAR) tablet 5 mg  5 mg Oral Daily 02/26/21, MD   5 mg at 02/24/21 0840   folic acid (FOLVITE) tablet 1 mg  1 mg Oral Daily 02/26/21, MD   1 mg at 02/24/21 0840   furosemide (LASIX) tablet 40 mg  40 mg Oral Daily Wouk, 02/26/21, MD   40 mg  at 02/24/21 0840   insulin aspart (novoLOG) injection 0-15 Units  0-15 Units Subcutaneous Q4H Andris Baumann, MD   3 Units at 02/24/21 1201   insulin glargine-yfgn (SEMGLEE) injection 16 Units  16 Units Subcutaneous QHS Kathrynn Running, MD   16 Units at 02/23/21 2031   ipratropium-albuterol (DUONEB) 0.5-2.5 (3) MG/3ML nebulizer solution 3 mL  3 mL Nebulization Q4H PRN Andris Baumann, MD   3 mL at 02/23/21 0041   pantoprazole (PROTONIX) EC tablet 40 mg  40 mg Oral Daily Lindajo Royal V, MD   40 mg at 02/24/21 0840   piperacillin-tazobactam (ZOSYN) IVPB 3.375 g  3.375 g Intravenous Q8H BeersSherlynn Carbon, RPH 12.5 mL/hr at 02/24/21 1205 3.375 g at 02/24/21 1205   potassium chloride SA (KLOR-CON) CR tablet 20 mEq  20 mEq Oral Daily Andris Baumann, MD   20 mEq at 02/24/21 0846   rivaroxaban (XARELTO) tablet 20 mg  20 mg Oral Q supper  Martyn Malay, RPH   20 mg at 02/23/21 1642   senna-docusate (Senokot-S) tablet 2 tablet  2 tablet Oral QHS Kathrynn Running, MD   2 tablet at 02/23/21 2032   tamsulosin (FLOMAX) capsule 0.4 mg  0.4 mg Oral Daily Lindajo Royal V, MD   0.4 mg at 02/24/21 0840   tiotropium [facility]) inhalation capsule ([facility] use ONLY) 18 mcg  18 mcg Inhalation Daily Andris Baumann, MD   18 mcg at 02/24/21 7078     Discharge Medications: Please see discharge summary for a list of discharge medications.  Relevant Imaging Results:  Relevant Lab Results:   Additional Information SSN:[id number]  Bayler Nehring A Marlos Carmen, LCSW

## 2021-02-25 ENCOUNTER — Inpatient Hospital Stay: Payer: Medicare Other

## 2021-02-25 LAB — CULTURE, BLOOD (ROUTINE X 2)
Culture: NO GROWTH
Culture: NO GROWTH
Special Requests: ADEQUATE
Special Requests: ADEQUATE

## 2021-02-25 LAB — CBC
HCT: 34.2 % — ABNORMAL LOW (ref 39.0–52.0)
Hemoglobin: 10.7 g/dL — ABNORMAL LOW (ref 13.0–17.0)
MCH: 29.2 pg (ref 26.0–34.0)
MCHC: 31.3 g/dL (ref 30.0–36.0)
MCV: 93.2 fL (ref 80.0–100.0)
Platelets: 311 10*3/uL (ref 150–400)
RBC: 3.67 MIL/uL — ABNORMAL LOW (ref 4.22–5.81)
RDW: 13.4 % (ref 11.5–15.5)
WBC: 5.4 10*3/uL (ref 4.0–10.5)
nRBC: 0 % (ref 0.0–0.2)

## 2021-02-25 LAB — BODY FLUID CULTURE W GRAM STAIN: Culture: NO GROWTH

## 2021-02-25 LAB — GLUCOSE, CAPILLARY
Glucose-Capillary: 170 mg/dL — ABNORMAL HIGH (ref 70–99)
Glucose-Capillary: 202 mg/dL — ABNORMAL HIGH (ref 70–99)
Glucose-Capillary: 246 mg/dL — ABNORMAL HIGH (ref 70–99)
Glucose-Capillary: 257 mg/dL — ABNORMAL HIGH (ref 70–99)
Glucose-Capillary: 308 mg/dL — ABNORMAL HIGH (ref 70–99)
Glucose-Capillary: 78 mg/dL (ref 70–99)
Glucose-Capillary: 81 mg/dL (ref 70–99)

## 2021-02-25 LAB — BASIC METABOLIC PANEL
Anion gap: 8 (ref 5–15)
BUN: 20 mg/dL (ref 8–23)
CO2: 33 mmol/L — ABNORMAL HIGH (ref 22–32)
Calcium: 8.1 mg/dL — ABNORMAL LOW (ref 8.9–10.3)
Chloride: 99 mmol/L (ref 98–111)
Creatinine, Ser: 1.15 mg/dL (ref 0.61–1.24)
GFR, Estimated: >60 mL/min (ref >60)
Glucose, Bld: 75 mg/dL (ref 70–99)
Potassium: 3.3 mmol/L — ABNORMAL LOW (ref 3.5–5.1)
Sodium: 140 mmol/L (ref 135–145)

## 2021-02-25 LAB — CYTOLOGY - NON PAP

## 2021-02-25 LAB — MAGNESIUM: Magnesium: 2 mg/dL (ref 1.7–2.4)

## 2021-02-25 MED ORDER — POTASSIUM CHLORIDE CRYS ER 20 MEQ PO TBCR
40.0000 meq | EXTENDED_RELEASE_TABLET | ORAL | Status: AC
  Administered 2021-02-25 (×2): 40 meq via ORAL
  Filled 2021-02-25 (×2): qty 2

## 2021-02-25 MED ORDER — INSULIN GLARGINE-YFGN 100 UNIT/ML ~~LOC~~ SOLN
14.0000 [IU] | Freq: Every day | SUBCUTANEOUS | Status: DC
  Administered 2021-02-25: 14 [IU] via SUBCUTANEOUS
  Filled 2021-02-25 (×2): qty 0.14

## 2021-02-25 NOTE — Plan of Care (Signed)

## 2021-02-25 NOTE — Progress Notes (Addendum)
PROGRESS NOTE    Samuel Thomas  OFB:[id number] DOB: 24-Aug-1935 DOA: 02/20/2021 PCP: Jenell Milliner, MD  Outpatient Specialists: cardiology    Brief Narrative:   From admission hpi Samuel Thomas is a 85 y.o. male with medical history significant for DM, HTN, diastolic heart failure, paroxysmal A. fib on Xarelto and Tikosyn, thyroid nodules, iron deficiency anemia, frequent falls, COPD, hearing impairment chronic opioid use, recently hospitalized for pneumonia from 10/27-11/4 and discharged to rehab , who presents to the ED from rehab due to blood sugar in the 500s for several days and altered mental status described as increased confusion.  History obtained mostly from ER provider and ER documentation due to patient's confusion.  O2 sat 85% on room air   Assessment & Plan:   Active Problems:   Chronic diastolic heart failure (HCC)   Essential hypertension   AKI (acute kidney injury) (HCC)   Chronic, continuous use of opioids   COPD (chronic obstructive pulmonary disease) (HCC)   Atrial fibrillation with RVR (HCC)   Polypharmacy   HCAP (healthcare-associated pneumonia)   Hyperglycemia due to type 2 diabetes mellitus (HCC)   Acute on chronic anemia   Acute respiratory failure with hypercapnia (HCC)   Acute metabolic encephalopathy   Severe sepsis (HCC)  # Healthcare associated pneumonia 2/2 strep pneumonia # Sepsis # Pleural effusion # COPD Sepsis criteria by tachycardia, tachypnea, elevated lactic acid, encephalopathy. Hypoxic on arrival. CXR with R lung disease with effusion, malignancy not excluded. Treated with cephalosporin and azithromycin for CAP earlier this month. S/p fluid resuscitation. Currently hemodynamically stable, sepsis physiology resolved. Covid/flu neg. CT with large effusion s/p IR 2 L paracentesis 11/11. Exudative by light's criteria but pulm thinks that's equivocal, more likely transudate. Culture ngtd. Urine strep pneumo antigen positive. Respiratory panel  neg. Respiratory status improved, now weaned off o2 - have stopped zosyn, is now s/p 5 days tx - f/u cultures, ngtd - pulm advises further w/u if effusion recurs. Consider cxr in several weeks as outpatient  # Acute metabolic encephalopathy Likely 2/2 infection, hypoxia. CTH neg. Resolved. - treating as above - hold home gabapentin  # Debility Here from SNF - pt consult, will return to SNF. Insurance authorization pending - patient is medically stable for discharge  # Acute hypoxic respiratory failure O2 in mid 80s on arrival, normalized on 3 L, now weaned off O2  # A fib with RVR RVR resolved - cont home dilt, tikosyn - xarelto - will check ekg in AM for qtc monitoring on tikosyn  # Acute kidney injury Resolved w/ fluid resuscitation  # Hypokalemia 3.3 today. Goal w/ Joice Lofts is 4 - 80 meq ordered - monitor  # Anemia Likely 2/2 chronic disease. Stable hgb in the 9s - monitor  # T2DM Controlled - lantus 14; cont SSI  # HFpEF Appears euvolemic - home furosemide  # HTN Here bp mildly elevated - cont home diltiazem, atorvastatin  # BPH Home proscar, flomax   DVT prophylaxis: xarelto Code Status: DNR Family Communication: grandson and poa david updated 11/15  Level of care: Progressive Status is: Inpatient  Remains inpatient appropriate because: severity of illness   Consultants:  none  Procedures: none  Antimicrobials:  S/p 5 days zosyn   Subjective: Awake and alert, tolerating diet. Denies cough/dyspnea. Says breathing feels normal.  Objective: Vitals:   02/25/21 0434 02/25/21 0739 02/25/21 0811 02/25/21 1134  BP: 122/60 130/70  131/61  Pulse: 68 75 72 81  Resp: 20  16  Temp: 97.9 F (36.6 C) 97.8 F (36.6 C)  (!) 97.5 F (36.4 C)  TempSrc:  Oral    SpO2: 95% 95% 90% 96%  Weight:      Height:        Intake/Output Summary (Last 24 hours) at 02/25/2021 1355 Last data filed at 02/25/2021 1000 Gross per 24 hour  Intake 650 ml   Output 727 ml  Net -77 ml   Filed Weights   02/20/21 2000 02/21/21 0830 02/24/21 0207  Weight: 80.4 kg 81 kg 79 kg    Examination:  General exam: Appears calm and comfortable,  Respiratory system: rales at bases, no wheeze Cardiovascular system: irreg irreg, mild systolic murmur, no edema Gastrointestinal system: Abdomen is nondistended, soft and nontender. No organomegaly or masses felt. Normal bowel sounds heard. Central nervous system: awake and alert, moves all 4 extremities Extremities: b/l le edema Skin: hyperpigmentation lower calves Psychiatry: calm, confused    Data Reviewed: I have personally reviewed following labs and imaging studies  CBC: Recent Labs  Lab 02/20/21 1743 02/21/21 0621 02/22/21 0547 02/23/21 0505 02/24/21 0502 02/25/21 0527  WBC 7.7 5.5 6.0 5.8 5.9 5.4  NEUTROABS 6.6  --   --   --   --   --   HGB 10.1* 8.2* 9.1* 9.2* 10.2* 10.7*  HCT 32.3* 27.2* 28.8* 28.4* 32.4* 34.2*  MCV 99.7 99.3 95.4 95.0 94.2 93.2  PLT 230 195 238 239 258 311   Basic Metabolic Panel: Recent Labs  Lab 02/21/21 0746 02/22/21 0547 02/23/21 0505 02/24/21 0502 02/25/21 0527  NA 141 142 140 138 140  K 4.6 4.2 4.2 3.8 3.3*  CL 104 104 104 99 99  CO2 29 31 31 31  33*  GLUCOSE 155* 178* 187* 101* 75  BUN 20 20 26* 26* 20  CREATININE 1.16 1.12 1.08 1.24 1.15  CALCIUM 8.1* 8.5* 8.4* 8.2* 8.1*  MG  --  2.0 2.4 2.2 2.0   GFR: Estimated Creatinine Clearance: 45.5 mL/min (by C-G formula based on SCr of 1.15 mg/dL). Liver Function Tests: Recent Labs  Lab 02/20/21 1743 02/21/21 1527  AST 19  --   ALT 17  --   ALKPHOS 82  --   BILITOT 0.5  --   PROT 6.4* 6.0*  ALBUMIN 2.6*  --    No results for input(s): LIPASE, AMYLASE in the last 168 hours. No results for input(s): AMMONIA in the last 168 hours. Coagulation Profile: Recent Labs  Lab 02/20/21 1743 02/21/21 0621  INR 1.4* 1.3*   Cardiac Enzymes: No results for input(s): CKTOTAL, CKMB, CKMBINDEX,  TROPONINI in the last 168 hours. BNP (last 3 results) No results for input(s): PROBNP in the last 8760 hours. HbA1C: No results for input(s): HGBA1C in the last 72 hours. CBG: Recent Labs  Lab 02/24/21 2135 02/25/21 0033 02/25/21 0342 02/25/21 0741 02/25/21 1133  GLUCAP 257* 170* 81 78 308*   Lipid Profile: No results for input(s): CHOL, HDL, LDLCALC, TRIG, CHOLHDL, LDLDIRECT in the last 72 hours. Thyroid Function Tests: No results for input(s): TSH, T4TOTAL, FREET4, T3FREE, THYROIDAB in the last 72 hours. Anemia Panel: No results for input(s): VITAMINB12, FOLATE, FERRITIN, TIBC, IRON, RETICCTPCT in the last 72 hours. Urine analysis:    Component Value Date/Time   COLORURINE YELLOW (A) 02/20/2021 1743   APPEARANCEUR CLEAR (A) 02/20/2021 1743   LABSPEC 1.010 02/20/2021 1743   PHURINE 5.0 02/20/2021 1743   GLUCOSEU 150 (A) 02/20/2021 1743   HGBUR NEGATIVE 02/20/2021 1743   BILIRUBINUR  NEGATIVE 02/20/2021 1743   KETONESUR NEGATIVE 02/20/2021 1743   PROTEINUR NEGATIVE 02/20/2021 1743   UROBILINOGEN 0.2 12/01/2013 2020   NITRITE NEGATIVE 02/20/2021 1743   LEUKOCYTESUR NEGATIVE 02/20/2021 1743   Sepsis Labs: @LABRCNTIP (procalcitonin:4,lacticidven:4)  ) Recent Results (from the past 240 hour(s))  Blood Culture (routine x 2)     Status: None (Preliminary result)   Collection Time: 02/20/21  5:33 PM   Specimen: BLOOD  Result Value Ref Range Status   Specimen Description BLOOD LEFT ANTECUBITAL  Final   Special Requests   Final    BOTTLES DRAWN AEROBIC AND ANAEROBIC Blood Culture adequate volume   Culture   Final    NO GROWTH 4 DAYS Performed at [facility], [address] [place], [place]    Report Status PENDING  Incomplete  Resp Panel by RT-PCR (Flu A&B, Covid) Nasopharyngeal Swab     Status: None   Collection Time: 02/20/21  5:43 PM   Specimen: Nasopharyngeal Swab; Nasopharyngeal(NP) swabs in vial transport medium  Result Value Ref Range Status    SARS Coronavirus 2 by RT PCR NEGATIVE NEGATIVE Final    Comment: (NOTE) SARS-CoV-2 target nucleic acids are NOT DETECTED.  The SARS-CoV-2 RNA is generally detectable in upper respiratory specimens during the acute phase of infection. The lowest concentration of SARS-CoV-2 viral copies this assay can detect is 138 copies/mL. A negative result does not preclude SARS-Cov-2 infection and should not be used as the sole basis for treatment or other patient management decisions. A negative result may occur with  improper specimen collection/handling, submission of specimen other than nasopharyngeal swab, presence of viral mutation(s) within the areas targeted by this assay, and inadequate number of viral copies(<138 copies/mL). A negative result must be combined with clinical observations, patient history, and epidemiological information. The expected result is Negative.  Fact Sheet for Patients:  13/10/22  Fact Sheet for Healthcare Providers:  BloggerCourse.com  This test is no t yet approved or cleared by the [employer]ious[employer].[place] FDA and  has been authorized for detection and/or diagnosis of SARS-CoV-2 by FDA under an Emergency Use Authorization (EUA). This EUA will remain  in effect (meaning this test can be used) for the duration of the COVID-19 declaration under Section 564(b)(1) of the Act, 21 U.S.C.section 360bbb-3(b)(1), unless the authorization is terminated  or revoked sooner.       Influenza A by PCR NEGATIVE NEGATIVE Final   Influenza B by PCR NEGATIVE NEGATIVE Final    Comment: (NOTE) The Xpert Xpress SARS-CoV-2/FLU/RSV plus assay is intended as an aid in the diagnosis of influenza from Nasopharyngeal swab specimens and should not be used as a sole basis for treatment. Nasal washings and aspirates are unacceptable for Xpert Xpress SARS-CoV-2/FLU/RSV testing.  Fact Sheet for  Patients: [place]  Fact Sheet for Healthcare Providers: BloggerCourse.com  This test is not yet approved or cleared by the [employer]ious[employer]ker.[place] FDA and has been authorized for detection and/or diagnosis of SARS-CoV-2 by FDA under an Emergency Use Authorization (EUA). This EUA will remain in effect (meaning this test can be used) for the duration of the COVID-19 declaration under Section 564(b)(1) of the Act, 21 U.S.C. section 360bbb-3(b)(1), unless the authorization is terminated or revoked.  Performed at [facility], [address], [place], [place]   Urine Culture     Status: None   Collection Time: 02/20/21  5:43 PM   Specimen: In/Out Cath Urine  Result Value Ref Range Status   Specimen Description  Final    IN/OUT CATH URINE Performed at [facility], [address], [place], [place] [address]    Special Requests   Final    NONE Performed at [facility], [address] [place], [place] [address]    Culture   Final    NO GROWTH Performed at [facility], [address], [place], [place] [address]    Report Status 02/21/2021 FINAL  Final  Blood Culture (routine x 2)     Status: None (Preliminary result)   Collection Time: 02/20/21  5:53 PM   Specimen: BLOOD  Result Value Ref Range Status   Specimen Description BLOOD BLOOD LEFT FOREARM  Final   Special Requests   Final    BOTTLES DRAWN AEROBIC AND ANAEROBIC Blood Culture adequate volume   Culture   Final    NO GROWTH 4 DAYS Performed at [facility], [address], [place], [place] [address]    Report Status PENDING  Incomplete  MRSA Next Gen by PCR, Nasal     Status: None   Collection Time: 02/20/21 10:41 PM   Specimen: Nasal Mucosa; Nasal Swab  Result Value Ref Range Status   MRSA by PCR Next Gen NOT DETECTED NOT DETECTED Final    Comment: (NOTE) The GeneXpert MRSA Assay (FDA approved for NASAL  specimens only), is one component of a comprehensive MRSA colonization surveillance program. It is not intended to diagnose MRSA infection nor to guide or monitor treatment for MRSA infections. Test performance is not FDA approved in patients less than 66 years old. Performed at [facility], [address] [place], [place] [address]   Respiratory (~20 pathogens) panel by PCR     Status: None   Collection Time: 02/21/21 10:06 AM   Specimen: Nasopharyngeal Swab; Respiratory  Result Value Ref Range Status   Adenovirus NOT DETECTED NOT DETECTED Final   Coronavirus 229E NOT DETECTED NOT DETECTED Final    Comment: (NOTE) The Coronavirus on the Respiratory Panel, DOES NOT test for the novel  Coronavirus (2019 nCoV)    Coronavirus HKU1 NOT DETECTED NOT DETECTED Final   Coronavirus NL63 NOT DETECTED NOT DETECTED Final   Coronavirus OC43 NOT DETECTED NOT DETECTED Final   Metapneumovirus NOT DETECTED NOT DETECTED Final   Rhinovirus / Enterovirus NOT DETECTED NOT DETECTED Final   Influenza A NOT DETECTED NOT DETECTED Final   Influenza B NOT DETECTED NOT DETECTED Final   Parainfluenza Virus 1 NOT DETECTED NOT DETECTED Final   Parainfluenza Virus 2 NOT DETECTED NOT DETECTED Final   Parainfluenza Virus 3 NOT DETECTED NOT DETECTED Final   Parainfluenza Virus 4 NOT DETECTED NOT DETECTED Final   Respiratory Syncytial Virus NOT DETECTED NOT DETECTED Final   Bordetella pertussis NOT DETECTED NOT DETECTED Final   Bordetella Parapertussis NOT DETECTED NOT DETECTED Final   Chlamydophila pneumoniae NOT DETECTED NOT DETECTED Final   Mycoplasma pneumoniae NOT DETECTED NOT DETECTED Final    Comment: Performed at [facility], [address] [place], [place] [address]  Body fluid culture w Gram Stain     Status: None   Collection Time: 02/21/21  4:39 PM   Specimen: PATH Cytology Pleural fluid  Result Value Ref Range Status   Specimen Description   Final    PLEURAL Performed at  [facility], [address], [place], [place] [address]    Special Requests   Final    NONE Performed at [facility], [address] [place], [place] [address]    Gram Stain   Final    WBC PRESENT, PREDOMINANTLY MONONUCLEAR NO ORGANISMS SEEN    Culture   Final    NO GROWTH 3 DAYS Performed at [facility], [address], [place], [place] [address]    Report Status 02/25/2021 FINAL  Final         Radiology Studies: No results found.      Scheduled Meds:  atorvastatin  20 mg Oral QHS   budesonide  0.5 mg Inhalation Daily   diltiazem  240 mg Oral Daily   dofetilide  125 mcg Oral BID   finasteride  5 mg Oral Daily   folic acid  1 mg Oral Daily   furosemide  40 mg Oral Daily   insulin aspart  0-15 Units Subcutaneous Q4H   insulin glargine-yfgn  16 Units Subcutaneous QHS   pantoprazole  40 mg Oral Daily   potassium chloride SA  20 mEq Oral Daily   rivaroxaban  20 mg Oral Q supper   senna-docusate  2 tablet Oral QHS   tamsulosin  0.4 mg Oral Daily   tiotropium  18 mcg Inhalation Daily   Continuous Infusions:  sodium chloride Stopped (02/22/21 1516)     LOS: 5 days    Time spent: 20 min    Silvano Bilis, MD [facility]   If 7PM-7AM, please contact night-coverage [email or web address] Password [email or web address] 02/25/2021, 1:55 PM

## 2021-02-25 NOTE — Care Management Important Message (Signed)
Important Message  Patient Details  Name: NATHANUEL CABREJA MRN: [id number] Date of Birth: May 12, 1935   Medicare Important Message Given:  Yes     [name] 02/25/2021, 2:21 PM

## 2021-02-25 NOTE — Evaluation (Addendum)
Objective Swallowing Evaluation: Type of Study: MBS-Modified Barium Swallow Study   Patient Details  Name: Samuel Thomas MRN: [id number] Date of Birth: 01-08-1936  Today's Date: 02/25/2021 Time: SLP Start Time (ACUTE ONLY): 1025 -SLP Stop Time (ACUTE ONLY): 1125  SLP Time Calculation (min) (ACUTE ONLY): 60 min   Past Medical History:  Past Medical History:  Diagnosis Date   COPD (chronic obstructive pulmonary disease) (HCC)    DM2 (diabetes mellitus, type 2) (HCC)    HLD (hyperlipidemia)    HTN (hypertension)    Past Surgical History:  Past Surgical History:  Procedure Laterality Date   HERNIA REPAIR     HPI: Pt is a 85 y.o. male with medical history significant for DM, HTN, diastolic heart failure, paroxysmal A. fib on Xarelto and Tikosyn, thyroid nodules, iron deficiency anemia, frequent falls, COPD, hearing impairment, chronic opioid use, previous alcohol use per POA, recently hospitalized for Pneumonia from 10/27-11/4 and discharged to rehab , who presents to the ED from rehab due to blood sugar in the 500s for several days and altered mental status described as increased confusion.  Pt endorsed "years" of swallowing problems but was not clear in details. Pt does have Cervical Fusion surgery Baseline.   CT of Chest: "Large right pleural effusion. There is decreased aeration in the right lung, possibly compression atelectasis and pneumonia. There is  small 15 mm loculated fluid collection with possible small pocket of  air in the inferior aspect of right lower lobe. This may suggest  incidental bulla with fluid or infectious process with possible  small lung abscess. As far as seen, there are no central obstructing  lesions in the right hilum. There are slightly enlarged lymph nodes  in the mediastinum, possibly suggesting reactive hyperplasia of  lymph nodes.  Small left pleural effusion. There are small patchy infiltrates in  the posterior segment of left upper lobe suggesting   atelectasis/pneumonia. Extensive coronary artery calcifications are seen.".  CT of Head: "No acute intracranial abnormality.  2. Stable atrophy, chronic microvascular ischemic white matter  disease and bilateral punctate thalamic lacunar infarcts.".   Subjective: pt sitting in MBS chair; min HOH. Engaged easily w/ SLP, staff    Recommendations for follow up therapy are one component of a multi-disciplinary discharge planning process, led by the attending physician.  Recommendations may be updated based on patient status, additional functional criteria and insurance authorization.  Assessment / Plan / Recommendation  Clinical Impressions 02/25/2021  Clinical Impression Pt appears to present w/ oropharyngeal phase dysphagia w/ primary pharyngeal phase deficits resulting in aspiration of thin liquids, w/ Delayed Cough. Also noted suspected Esophageal phase dysmotility c/b bolus stasis noted during f/u, Dry swallowing.     During the Pharyngeal phase, Delayed swallow initiation noted w/ thin liquid consistency spilling into the Pyriform sinuses. This, along w/ reduced airway closure, resulted in bolus material entering the airway, passing BELOW the vocal cords, and not ejected out despite Delayed Cough attempt by pt. Fairly consistency "flash" laryngeal Penetration also occurred which was Silent -- no Cough response. Reduced epiglottic inversion observed suspect impacted by reduced laryngeal elevation; also reduced pharyngeal peristalsis. In addition, there is a Bulge from the posterior pharyngeal wall at the level of the beginning of the ~C3-6 Cervical Fusion -- this seemed to impede epiglottic inversion somewhat. Reduced airway/laryngeal was fairly consistent, however, use of SMALL, SINGLE SIPS were best controlled w/ reduced laryngeal Penetration/Aspiration during swallowing occurring. Pt also exhibited MOD pharyngeal residue both in the Valleculae and  Pyriform sinuses.  Compensatory strategies  attempted: f/u, DRY swallows were effective in reducing the pharyngeal residue. Pt was also instructed to use a strong throat clear/cough to clear any penetrated material during the meal. Use of a chin tuck strategy was Not effective in preventing penetration/aspiration from occurring.  During the Oral phase, pt exhibited min lengthier mastication (missing many Dentition) and oral clearing (min residue post initial swallow). W/ a f/u DRY swallow, oral clearing was achieved.  During the Esophageal phase, pt appeared to exhibit a degree of Esophageal phase dysmotility c/b bolus stasis noted between f/u, Dry swallows. No Retrograde to the pyriform sinuses occurred.       Pt appears to benefit from maintaing a dysphagia diet of Mech Soft foods (missing many Dentition) and Nectar liquids via Cup (for best control of bolus size) w/ Aspiration and Reflux precautions. Compensatory strategies during oral intake:  F/u, DRY Swallows Intermittent Throat clear/Cough to aid laryngeal clearing  Could consider Pleasure ice chips and/or sips of water post thorough oral care in between meals per MD order at discharge to next venue of care, per pt agreement and education given on precautions/consequences -- this option could be appropriate in addressing QOL goals. Recommend pt have further discussion w/ MD, POA.  SLP Visit Diagnosis Dysphagia, oropharyngeal phase (R13.12);Dysphagia, pharyngoesophageal phase (R13.14)  Attention and concentration deficit following --  Frontal lobe and executive function deficit following --  Impact on safety and function Moderate aspiration risk;Risk for inadequate nutrition/hydration      Treatment Recommendations 02/25/2021  Treatment Recommendations Therapy as outlined in treatment plan below     Prognosis 02/25/2021  Prognosis for Safe Diet Advancement Fair  Barriers to Reach Goals Time post onset;Severity of deficits  Barriers/Prognosis Comment baseline Dysphagia    Diet  Recommendations 02/25/2021  SLP Diet Recommendations Dysphagia 3 (Mech soft) solids;Nectar thick liquid  Liquid Administration via Cup;No straw  Medication Administration Whole meds with puree  Compensations Minimize environmental distractions;Slow rate;Small sips/bites;Lingual sweep for clearance of pocketing;Multiple dry swallows after each bite/sip;Clear throat intermittently;Follow solids with liquid  Postural Changes Remain semi-upright after after feeds/meals (Comment);Seated upright at 90 degrees      Other Recommendations 02/25/2021  Recommended Consults (No Data)  Oral Care Recommendations Oral care BID;Oral care before and after PO;Patient independent with oral care  Other Recommendations Order thickener from pharmacy;Prohibited food (jello, ice cream, thin soups);Remove water pitcher;Have oral suction available  Follow Up Recommendations Skilled nursing-short term rehab (<3 hours/day)  Assistance recommended at discharge Intermittent Supervision/Assistance  Functional Status Assessment Patient has had a recent decline in their functional status and/or demonstrates limited ability to make significant improvements in function in a reasonable and predictable amount of time    Frequency and Duration  02/25/2021  Speech Therapy Frequency (ACUTE ONLY) min 2x/week  Treatment Duration 1 week      Oral Phase 02/25/2021  Oral Phase Impaired  Oral - Pudding Teaspoon --  Oral - Pudding Cup --  Oral - Honey Teaspoon --  Oral - Honey Cup --  Oral - Nectar Teaspoon 2  Oral - Nectar Cup 3  Oral - Nectar Straw --  Oral - Thin Teaspoon 2  Oral - Thin Cup 4  Oral - Thin Straw --  Oral - Puree 2  Oral - Mech Soft 1  Oral - Regular --  Oral - Multi-Consistency --  Oral - Pill --  Oral Phase - Comment --    Pharyngeal Phase 02/25/2021  Pharyngeal Phase Impaired  Pharyngeal-  Pudding Teaspoon --  Pharyngeal --  Pharyngeal- Pudding Cup --  Pharyngeal --  Pharyngeal- Honey Teaspoon --   Pharyngeal --  Pharyngeal- Honey Cup --  Pharyngeal --  Pharyngeal- Nectar Teaspoon 2  Pharyngeal --  Pharyngeal- Nectar Cup 3  Pharyngeal --  Pharyngeal- Nectar Straw --  Pharyngeal --  Pharyngeal- Thin Teaspoon and all thin liquids; then other bolus trials Delayed swallow initiation-pyriform sinuses;Reduced pharyngeal peristalsis;Reduced epiglottic inversion;Reduced laryngeal elevation;Reduced airway/laryngeal closure;Penetration/Aspiration during swallow;Trace aspiration;Pharyngeal residue - valleculae;Pharyngeal residue - pyriform;Compensatory strategies attempted (with notebox)  Pharyngeal Material enters airway, passes BELOW cords and not ejected out despite cough attempt by patient  Pharyngeal- Thin Cup 2, 4  Pharyngeal --  Pharyngeal- Thin Straw --  Pharyngeal --  Pharyngeal- Puree 2  Pharyngeal --  Pharyngeal- Mechanical Soft 1  Pharyngeal --  Pharyngeal- Regular --  Pharyngeal --  Pharyngeal- Multi-consistency --  Pharyngeal --  Pharyngeal- Pill --  Pharyngeal --  Pharyngeal Comment suspect some impact from reduced posterior pharyngeal wall movement d/t the cervical fusion     Cervical Esophageal Phase  02/25/2021  Cervical Esophageal Phase Impaired  Pudding Teaspoon --  Pudding Cup --  Honey Teaspoon --  Honey Cup --  Nectar Teaspoon 2  Nectar Cup 3  Nectar Straw --  Thin Teaspoon 2  Thin Cup 4  Thin Straw --  Puree 2  Mechanical Soft 1  Regular --  Multi-consistency --  Pill --  Cervical Esophageal Comment bolus stasis seen during f/u, Dry swallows immediately post initial swallow           Jerilynn Som, MS, CCC-SLP Speech Language Pathologist [facility] [phone] [facility] 02/25/2021, 5:39 PM

## 2021-02-26 LAB — BASIC METABOLIC PANEL
Anion gap: 4 — ABNORMAL LOW (ref 5–15)
BUN: 17 mg/dL (ref 8–23)
CO2: 32 mmol/L (ref 22–32)
Calcium: 7.7 mg/dL — ABNORMAL LOW (ref 8.9–10.3)
Chloride: 104 mmol/L (ref 98–111)
Creatinine, Ser: 1.05 mg/dL (ref 0.61–1.24)
GFR, Estimated: >60 mL/min (ref >60)
Glucose, Bld: 88 mg/dL (ref 70–99)
Potassium: 3.5 mmol/L (ref 3.5–5.1)
Sodium: 140 mmol/L (ref 135–145)

## 2021-02-26 LAB — RESP PANEL BY RT-PCR (FLU A&B, COVID) ARPGX2
Influenza A by PCR: NEGATIVE
Influenza B by PCR: NEGATIVE
SARS Coronavirus 2 by RT PCR: NEGATIVE

## 2021-02-26 LAB — GLUCOSE, CAPILLARY
Glucose-Capillary: 115 mg/dL — ABNORMAL HIGH (ref 70–99)
Glucose-Capillary: 162 mg/dL — ABNORMAL HIGH (ref 70–99)
Glucose-Capillary: 332 mg/dL — ABNORMAL HIGH (ref 70–99)
Glucose-Capillary: 82 mg/dL (ref 70–99)
Glucose-Capillary: 98 mg/dL (ref 70–99)

## 2021-02-26 LAB — MAGNESIUM: Magnesium: 1.9 mg/dL (ref 1.7–2.4)

## 2021-02-26 MED ORDER — POTASSIUM CHLORIDE CRYS ER 20 MEQ PO TBCR: 20.0000 meq | EXTENDED_RELEASE_TABLET | Freq: Two times a day (BID) | ORAL | 0 refills | Status: AC

## 2021-02-26 MED ORDER — POTASSIUM CHLORIDE CRYS ER 20 MEQ PO TBCR
40.0000 meq | EXTENDED_RELEASE_TABLET | ORAL | Status: DC
  Administered 2021-02-26 (×2): 40 meq via ORAL
  Filled 2021-02-26 (×2): qty 2

## 2021-02-26 NOTE — Discharge Summary (Signed)
Physician Discharge Summary   Patient name: Samuel Thomas  Admit date:     02/20/2021  Discharge date: 02/26/2021  Discharge Physician: Lynden Oxford   PCP: Jenell Milliner, MD   Recommendations at discharge: follow up with SLP and PT  Discharge Diagnoses Active Problems:   Chronic diastolic heart failure (HCC)   Essential hypertension   AKI (acute kidney injury) (HCC)   Chronic, continuous use of opioids   COPD (chronic obstructive pulmonary disease) (HCC)   Atrial fibrillation with RVR (HCC)   Polypharmacy   HCAP (healthcare-associated pneumonia)   Hyperglycemia due to type 2 diabetes mellitus (HCC)   Acute on chronic anemia   Acute respiratory failure with hypercapnia (HCC)   Acute metabolic encephalopathy   Severe sepsis [facility])  Hospital Course   # Healthcare associated pneumonia 2/2 strep pneumonia # Sepsis # Pleural effusion # COPD Sepsis criteria by tachycardia, tachypnea, elevated lactic acid, encephalopathy. Hypoxic on arrival. CXR with R lung disease with effusion, malignancy not excluded. Treated with cephalosporin and azithromycin for CAP earlier this month. S/p fluid resuscitation. Currently hemodynamically stable, sepsis physiology resolved. Covid/flu neg. CT with large effusion s/p IR 2 L paracentesis 11/11. Exudative by light's criteria but pulm thinks that's equivocal, more likely transudate. Culture ngtd. Urine strep pneumo antigen positive. Respiratory panel neg. Respiratory status improved, now weaned off o2 - have stopped zosyn, is now s/p 5 days tx - f/u cultures, ngtd - pulm advises further w/u if effusion recurs. Consider cxr in several weeks as outpatient   # Acute metabolic encephalopathy Likely 2/2 infection, hypoxia. CTH neg. Resolved. - treating as above - hold home gabapentin   # Debility Here from SNF - pt consult, will return to SNF. Insurance authorization pending - patient is medically stable for discharge   # Acute hypoxic respiratory  failure O2 in mid 80s on arrival, normalized on 3 L, now weaned off O2   # A fib with RVR RVR resolved - cont home dilt, tikosyn - xarelto   # Acute kidney injury Resolved w/ fluid resuscitation   # Hypokalemia Home regimen increased.    # Anemia Likely 2/2 chronic disease. Stable hgb in the 9s - monitor   # T2DM Controlled - lantus    # HFpEF Appears euvolemic - home furosemide   # HTN Here bp mildly elevated - cont home diltiazem, atorvastatin   # BPH Home proscar, flomax  Procedures performed: none   Condition at discharge: good  Exam General: Appear in mild distress, no Rash; Oral Mucosa Clear, moist. no Abnormal Neck Mass Or lumps, Conjunctiva normal  Cardiovascular: S1 and S2 Present, no Murmur, Respiratory: good respiratory effort, Bilateral Air entry present and CTA, no Crackles, no wheezes Abdomen: Bowel Sound present, Soft and no tenderness Extremities: no Pedal edema Neurology: alert and oriented to time, place, and person affect appropriate. no new focal deficit Gait not checked due to patient safety concerns    Disposition: Skilled nursing facility  Discharge time: greater than 30 minutes.  Follow-up Information     Jenell Milliner, MD. Schedule an appointment as soon as possible for a visit in 1 week(s).   Specialty: Internal Medicine Contact information: [address] [facility] [place] [address] [phone]         SLP. Schedule an appointment as soon as possible for a visit in 1 week(s).                  Allergies as of 02/26/2021  Reactions   Penicillins Swelling, Rash   Break outs swelling Other reaction(s): SWELLING in 1955   Apixaban Other (See Comments)   Cold intolerance Cold intolerance   Prednisone Rash, Other (See Comments)   Makes mean  Significant mood and behavioral changes - 'felt like I wasn't in control of anything'        Medication List     STOP taking these  medications    gabapentin 300 MG capsule Commonly known as: NEURONTIN       TAKE these medications    Acidophilus/Pectin Caps Acidophilus Probiotic 100 million cell-10 mg capsule  Take 1 capsule every day by oral route.   albuterol 108 (90 Base) MCG/ACT inhaler Commonly known as: VENTOLIN HFA Ventolin HFA 90 mcg/actuation aerosol inhaler  Inhale 2 puffs by mouth every 6 hours as needed for wheezing   atorvastatin 20 MG tablet Commonly known as: LIPITOR Take 20 mg by mouth at bedtime.   budesonide 0.5 MG/2ML nebulizer solution Commonly known as: PULMICORT Inhale 0.5 mg into the lungs daily.   Calcium Carb-Cholecalciferol 600-400 MG-UNIT Tabs Take 1 tablet by mouth daily.   cetirizine 10 MG tablet Commonly known as: ZYRTEC cetirizine 10 mg tablet  Take 1 tablet every day by oral route.   colchicine 0.6 MG tablet Take 0.6 mg by mouth as needed (gout).   diltiazem 240 MG 24 hr capsule Commonly known as: CARDIZEM CD Take 1 capsule (240 mg total) by mouth daily.   dofetilide 125 MCG capsule Commonly known as: TIKOSYN Take 125 mcg by mouth 2 (two) times daily.   feeding supplement (NEPRO CARB STEADY) Liqd Take 237 mLs by mouth 2 (two) times daily between meals.   Ferrous Sulfate-C-Folic Acid 105-500-0.8 MG Tbcr Take 1 tablet by mouth 2 (two) times daily.   finasteride 5 MG tablet Commonly known as: PROSCAR Take 5 mg by mouth daily.   fluticasone 50 MCG/ACT nasal spray Commonly known as: FLONASE Place 1-2 sprays into both nostrils daily.   folic acid 1 MG tablet Commonly known as: FOLVITE Take 1 mg by mouth daily.   furosemide 40 MG tablet Commonly known as: LASIX Take 1 tablet (40 mg total) by mouth daily.   guaiFENesin-dextromethorphan 100-10 MG/5ML syrup Commonly known as: ROBITUSSIN DM Take 10 mLs by mouth every 4 (four) hours as needed for cough.   insulin glargine 100 unit/mL Sopn Commonly known as: LANTUS Inject 10 Units into the skin at  bedtime.   insulin lispro 100 UNIT/ML injection Commonly known as: HUMALOG Inject 6 Units into the skin 3 (three) times daily before meals.   ipratropium-albuterol 0.5-2.5 (3) MG/3ML Soln Commonly known as: DUONEB Inhale into the lungs.   magnesium oxide 400 (240 Mg) MG tablet Commonly known as: MAG-OX Take 1 tablet (400 mg total) by mouth 2 (two) times daily.   metFORMIN 1000 MG tablet Commonly known as: GLUCOPHAGE Take 1,000 mg by mouth 2 (two) times daily.   omeprazole 20 MG capsule Commonly known as: PRILOSEC Take 20 mg by mouth daily.   One Daily tablet Take 1 tablet by mouth daily.   polyethylene glycol 17 g packet Commonly known as: MIRALAX / GLYCOLAX Take 17 g by mouth daily as needed for mild constipation.   potassium chloride SA 20 MEQ tablet Commonly known as: KLOR-CON Take 1 tablet (20 mEq total) by mouth 2 (two) times daily. What changed: when to take this   rivaroxaban 20 MG Tabs tablet Commonly known as: XARELTO Take 1 tablet (20 mg  total) by mouth daily with supper.   senna-docusate 8.6-50 MG tablet Commonly known as: Senokot-S Senna with Docusate Sodium 8.6 mg-50 mg tablet  Take 2 tablets twice a day by oral route.   Spiriva HandiHaler 18 MCG inhalation capsule Generic drug: tiotropium Spiriva with HandiHaler 18 mcg and inhalation capsules   tamsulosin 0.4 MG Caps capsule Commonly known as: FLOMAX Take 0.4 mg by mouth daily.   TrueTrack Test test strip Generic drug: glucose blood        DG Chest 1 View  Result Date: 02/10/2021 CLINICAL DATA:  Pneumonia, history of CHF EXAM: CHEST  1 VIEW COMPARISON:  October 27 FINDINGS: The heart measures at the upper limit of normal. There is increased right lower lobe consolidation. A small right pleural effusion cannot be excluded. No left pleural effusion. No pneumothorax. No addition to the increased consolidation the right lung, there is now new consolidation the mid lung. No acute osseous  abnormality. IMPRESSION: Worsening right lower lobe consolidation with new left mid lung consolidation concerning for multifocal pneumonia. Electronically Signed   By: Olive Bass M.D.   On: 02/10/2021 10:10   CT HEAD WO CONTRAST ( )  Result Date: 02/21/2021 CLINICAL DATA:  Encephalopathy EXAM: CT HEAD WITHOUT CONTRAST TECHNIQUE: Contiguous axial images were obtained from the base of the skull through the vertex without intravenous contrast. COMPARISON:  Prior head CT 02/06/2021 FINDINGS: Brain: No evidence of acute infarction, hemorrhage, hydrocephalus, extra-axial collection or mass lesion/mass effect. Mild cerebral and cerebellar cortical volume loss. Periventricular white matter hypoattenuation most consistent with chronic microvascular ischemic white matter disease. Punctate lacunar infarct in the bilateral thalami larger on the right than the left. Vascular: No hyperdense vessel or unexpected calcification. Skull: Normal. Negative for fracture or focal lesion. Sinuses/Orbits: No acute finding. Other: None. IMPRESSION: 1. No acute intracranial abnormality. 2. Stable atrophy, chronic microvascular ischemic white matter disease and bilateral punctate thalamic lacunar infarcts. Electronically Signed   By: Malachy Moan M.D.   On: 02/21/2021 10:12   CT Head Wo Contrast  Result Date: 02/06/2021 CLINICAL DATA:  Fall with weakness EXAM: CT HEAD WITHOUT CONTRAST TECHNIQUE: Contiguous axial images were obtained from the base of the skull through the vertex without intravenous contrast. COMPARISON:  CT brain 12/01/2013 FINDINGS: Brain: No acute territorial infarction, hemorrhage, or intracranial mass. Moderate atrophy. Moderate chronic small vessel ischemic changes of the white matter. Mildly prominent ventricles felt secondary to atrophy. Vascular: No hyperdense vessels. Vertebral and carotid vascular calcification Skull: Normal. Negative for fracture or focal lesion. Sinuses/Orbits: No acute finding.  Other: None IMPRESSION: 1. No CT evidence for acute intracranial abnormality. 2. Atrophy and chronic small vessel ischemic changes of the white matter Electronically Signed   By: Jasmine Pang M.D.   On: 02/06/2021 18:26   CT CHEST W CONTRAST  Result Date: 02/21/2021 CLINICAL DATA:  Pleural effusion, pneumonia EXAM: CT CHEST WITH CONTRAST TECHNIQUE: Multidetector CT imaging of the chest was performed during intravenous contrast administration. CONTRAST:  75mL OMNIPAQUE IOHEXOL 300 MG/ML  SOLN COMPARISON:  Previous studies including the chest radiographs done on 01/20/2021 FINDINGS: Cardiovascular: Extensive coronary artery calcifications are seen. Heart is enlarged in size. Small pericardial effusion is present. There is homogeneous enhancement in thoracic aorta. There are no intraluminal filling defects in the central pulmonary artery branches. Segmental and subsegmental pulmonary artery branches are not adequately visualized for evaluation. Mediastinum/Nodes: There are slightly enlarged lymph nodes in the mediastinum and hilar regions largest measuring 10 mm in short axis in the  precarinal region. Lungs/Pleura: There is large right pleural effusion. There is partial compression atelectasis in the right lung possibility of pneumonia in the right lung is not excluded. In the image 82 of series 5, there is 12 mm pocket of air in the inferior margin of right lower lobe. In the image 85 of series 5, there is possible small 15 mm loculated fluid collection in the right lower lobe adjacent to the pocket of air. As far as seen, there are no central mass lesions in the right hilum. Small left pleural effusion is seen. Part of the left pleural effusion is noted within the interlobar fissure. Small patchy infiltrates are seen in the left parahilar region and left lower lobe. There is no pneumothorax. Upper Abdomen: There is mild hyperplasia of adrenals. There is small coarse calcification in the left adrenal.  Musculoskeletal: There is calcification in the anterior spinal ligament. Please correlate for possible ankylosing spondylitis IMPRESSION: Large right pleural effusion. There is decreased aeration in the right lung, possibly compression atelectasis and pneumonia. There is small 15 mm loculated fluid collection with possible small pocket of air in the inferior aspect of right lower lobe. This may suggest incidental bulla with fluid or infectious process with possible small lung abscess. As far as seen, there are no central obstructing lesions in the right hilum. There are slightly enlarged lymph nodes in the mediastinum, possibly suggesting reactive hyperplasia of lymph nodes. Small left pleural effusion. There are small patchy infiltrates in the posterior segment of left upper lobe suggesting atelectasis/pneumonia. Extensive coronary artery calcifications are seen. Small pericardial effusion. There is calcification in the anterior spinal ligament in thoracic spine. Please correlate for possible ankylosing spondylitis. Electronically Signed   By: Ernie Avena M.D.   On: 02/21/2021 10:25   DG Chest Port 1 View  Result Date: 02/21/2021 CLINICAL DATA:  Post thoracentesis EXAM: PORTABLE CHEST 1 VIEW COMPARISON:  02/20/2021, CT 02/21/2021 FINDINGS: Decreased right pleural effusion with small residual post thoracentesis. No pneumothorax is seen. Cardiomegaly with vascular congestion. Patchy airspace disease in the left mid lung and right base. IMPRESSION: 1. Decreased right pleural effusion post thoracentesis without visible pneumothorax 2. Hazy left mid lung and right basilar opacity may reflect atelectasis or pneumonia. Electronically Signed   By: Jasmine Pang M.D.   On: 02/21/2021 16:51   DG Chest Port 1 View  Result Date: 02/20/2021 CLINICAL DATA:  Question sepsis. EXAM: PORTABLE CHEST 1 VIEW COMPARISON:  02/10/2021 FINDINGS: Interval progression of extensive infiltrate in the right mid and lower lung.  Progression of small to moderate right pleural effusion Left perihilar airspace disease is present with mild improvement. No effusion on the left. Negative for heart failure. IMPRESSION: Progressive airspace disease in the right mid and lower lung with progressive right effusion. Possible progressive pneumonia. Underlying mass lesion possible. Consider chest CT with contrast for further evaluation. Left perihilar airspace densities shows interval improvement. Electronically Signed   By: Marlan Palau M.D.   On: 02/20/2021 17:51   DG Chest Portable 1 View  Result Date: 02/06/2021 CLINICAL DATA:  Weakness EXAM: PORTABLE CHEST 1 VIEW COMPARISON:  12/02/2013 FINDINGS: Consolidation in the right lower lobe compatible with pneumonia. Heart is normal size. Left lung clear. No effusions or acute bony abnormality. IMPRESSION: Right lower lobe pneumonia. Followup PA and lateral chest X-ray is recommended in 3-4 weeks following trial of antibiotic therapy to ensure resolution and exclude underlying malignancy. Electronically Signed   By: Charlett Nose M.D.   On: 02/06/2021  17:53   ECHOCARDIOGRAM COMPLETE  Result Date: 02/08/2021    ECHOCARDIOGRAM REPORT   Patient Name:   NAREN BENALLY Date of Exam: 02/07/2021 Medical Rec #:  [id number]    Height:       67.0 in Accession #:    [id number]   Weight:       180.0 lb Date of Birth:  1935-06-24    BSA:          1.934 m Patient Age:    85 years     BP:           104/69 mmHg Patient Gender: M            HR:           63 bpm. Exam Location:  [facility] Procedure: 2D Echo Indications:     Atrial Fibrillation I48.91  History:         Patient has no prior history of Echocardiogram examinations.                  Risk Factors:Hypertension, Diabetes and Dyslipidemia.  Sonographer:     Johnathan Hausen Referring Phys:  [id number] M Abdikadir Fohl Diagnosing Phys: Arnoldo Hooker MD  Sonographer Comments: Technically difficult study due to poor echo windows, suboptimal parasternal window and no apical  window. Image acquisition challenging due to patient body habitus. IMPRESSIONS  1. Left ventricular ejection fraction, by estimation, is 55 to 60%. The left ventricle has normal function. The left ventricle has no regional wall motion abnormalities. Left ventricular diastolic function could not be evaluated.  2. Right ventricular systolic function is normal. The right ventricular size is normal.  3. Left atrial size was mildly dilated.  4. The mitral valve is normal in structure. Mild mitral valve regurgitation.  5. The aortic valve is grossly normal. Aortic valve regurgitation is not visualized. FINDINGS  Left Ventricle: Left ventricular ejection fraction, by estimation, is 55 to 60%. The left ventricle has normal function. The left ventricle has no regional wall motion abnormalities. The left ventricular internal cavity size was normal in size. There is  no left ventricular hypertrophy. Left ventricular diastolic function could not be evaluated. Right Ventricle: The right ventricular size is normal. No increase in right ventricular wall thickness. Right ventricular systolic function is normal. Left Atrium: Left atrial size was mildly dilated. Right Atrium: Right atrial size was normal in size. Pericardium: There is no evidence of pericardial effusion. Mitral Valve: The mitral valve is normal in structure. Mild mitral valve regurgitation. Tricuspid Valve: The tricuspid valve is normal in structure. Tricuspid valve regurgitation is trivial. Aortic Valve: The aortic valve is grossly normal. Aortic valve regurgitation is not visualized. Pulmonic Valve: The pulmonic valve was not assessed. Pulmonic valve regurgitation is not visualized. Aorta: The aortic root and ascending aorta are structurally normal, with no evidence of dilitation. IAS/Shunts: The interatrial septum was not assessed.  LEFT VENTRICLE PLAX 2D LVIDd:         4.70 cm LVIDs:         3.30 cm LV PW:         1.20 cm LV IVS:        0.70 cm  LEFT ATRIUM          Index LA diam:    3.70 cm 1.91 cm/m  TRICUSPID VALVE TR Peak grad:   30.5 mmHg TR Vmax:        276.00 cm/s Arnoldo Hooker MD Electronically signed by Arnoldo Hooker MD Signature Date/Time:  02/08/2021/7:10:44 AM    Final    US THYROID  Result Date: 02/07/2021 CLINICAL DATA:  Elevated serum free T4 level EXAM: THYROID ULTRASOUND TECHNIQUE: Ultrasound examination of the thyroid gland and adjacent soft tissues was performed. COMPARISON:  None. FINDINGS: Parenchymal Echotexture: Moderately heterogenous Isthmus: 0.2 cm thickness Right lobe: 4.2 x 1.8 x 1.8 cm Left lobe: 3.5 x 1.5 x 1.2 cm _________________________________________________________ Estimated total number of nodules >/= 1 cm: 1 Number of spongiform nodules >/=  2 cm not described below (TR1): 0 Number of mixed cystic and solid nodules >/= 1.5 cm not described below (TR2): 0 _________________________________________________________ Nodule 1: 0.9 cm benign septated cyst, mid right Nodule # 2: Location: Right; inferior Maximum size: 1.1 cm; Other 2 dimensions: 1.1 x 0.7 cm Composition: solid/almost completely solid (2) Echogenicity: hypoechoic (2) Shape: not taller-than-wide (0) Margins: smooth (0) Echogenic foci: macrocalcifications (1) ACR TI-RADS total points: 5. ACR TI-RADS risk category: TR4 (4-6 points). ACR TI-RADS recommendations: *Given size (>/= 1 - 1.4 cm) and appearance, a follow-up ultrasound in 1 year should be considered based on TI-RADS criteria. _________________________________________________________ Nodule 3: 0.7 cm complex cyst, mid left; This nodule does NOT meet TI-RADS criteria for biopsy or dedicated follow-up. No regional cervical adenopathy identified. IMPRESSION: 1. Normal-sized thyroid with bilateral nodules as above. None meets criteria for biopsy. 2. Recommend annual/biennial ultrasound follow-up of inferior right nodule as above, until stability x5 years confirmed. The above is in keeping with the ACR TI-RADS  recommendations - J Am Coll Radiol 2017;14:587-595. Electronically Signed   By: Corlis Leak M.D.   On: 02/07/2021 12:33   US THORACENTESIS ASP PLEURAL SPACE W/IMG GUIDE  Result Date: 02/24/2021 INDICATION: Patient admitted with pneumonia presents with large pleural effusion. Interventional radiology asked to perform a diagnostic and therapeutic thoracentesis. EXAM: ULTRASOUND GUIDED THORACENTESIS MEDICATIONS: 1% lidocaine 10 mL COMPLICATIONS: None immediate. PROCEDURE: An ultrasound guided thoracentesis was thoroughly discussed with the patient and questions answered. The benefits, risks, alternatives and complications were also discussed. The patient understands and wishes to proceed with the procedure. Written consent was obtained. Ultrasound was performed to localize and mark an adequate pocket of fluid in the right chest. The area was then prepped and draped in the normal sterile fashion. 1% Lidocaine was used for local anesthesia. Under ultrasound guidance a 6 Fr Safe-T-Centesis catheter was introduced. Thoracentesis was performed. The catheter was removed and a dressing applied. FINDINGS: A total of approximately 2 L of amber-colored fluid was removed. Samples were sent to the laboratory as requested by the clinical team. IMPRESSION: Successful ultrasound guided right thoracentesis yielding 2 L of pleural fluid. Read by: Alwyn Ren, NP Electronically Signed   By: Irish Lack M.D.   On: 02/24/2021 09:08   Results for orders placed or performed during the hospital encounter of 02/20/21  Blood Culture (routine x 2)     Status: None   Collection Time: 02/20/21  5:33 PM   Specimen: BLOOD  Result Value Ref Range Status   Specimen Description BLOOD LEFT ANTECUBITAL  Final   Special Requests   Final    BOTTLES DRAWN AEROBIC AND ANAEROBIC Blood Culture adequate volume   Culture   Final    NO GROWTH 5 DAYS Performed at [facility], [address] [place], [place] [address]    Report  Status 02/25/2021 FINAL  Final  Resp Panel by RT-PCR (Flu A&B, Covid) Nasopharyngeal Swab     Status: None   Collection Time: 02/20/21  5:43 PM  Specimen: Nasopharyngeal Swab; Nasopharyngeal(NP) swabs in vial transport medium  Result Value Ref Range Status   SARS Coronavirus 2 by RT PCR NEGATIVE NEGATIVE Final    Comment: (NOTE) SARS-CoV-2 target nucleic acids are NOT DETECTED.  The SARS-CoV-2 RNA is generally detectable in upper respiratory specimens during the acute phase of infection. The lowest concentration of SARS-CoV-2 viral copies this assay can detect is 138 copies/mL. A negative result does not preclude SARS-Cov-2 infection and should not be used as the sole basis for treatment or other patient management decisions. A negative result may occur with  improper specimen collection/handling, submission of specimen other than nasopharyngeal swab, presence of viral mutation(s) within the areas targeted by this assay, and inadequate number of viral copies(<138 copies/mL). A negative result must be combined with clinical observations, patient history, and epidemiological information. The expected result is Negative.  Fact Sheet for Patients:  BloggerCourse.com  Fact Sheet for Healthcare Providers:  SeriousBroker.it  This test is no t yet approved or cleared by the [place] FDA and  has been authorized for detection and/or diagnosis of SARS-CoV-2 by FDA under an Emergency Use Authorization (EUA). This EUA will remain  in effect (meaning this test can be used) for the duration of the COVID-19 declaration under Section 564(b)(1) of the Act, 21 U.S.C.section 360bbb-3(b)(1), unless the authorization is terminated  or revoked sooner.       Influenza A by PCR NEGATIVE NEGATIVE Final   Influenza B by PCR NEGATIVE NEGATIVE Final    Comment: (NOTE) The Xpert Xpress SARS-CoV-2/FLU/RSV plus assay is intended as an aid in the  diagnosis of influenza from Nasopharyngeal swab specimens and should not be used as a sole basis for treatment. Nasal washings and aspirates are unacceptable for Xpert Xpress SARS-CoV-2/FLU/RSV testing.  Fact Sheet for Patients: BloggerCourse.com  Fact Sheet for Healthcare Providers: SeriousBroker.it  This test is not yet approved or cleared by the [place] FDA and has been authorized for detection and/or diagnosis of SARS-CoV-2 by FDA under an Emergency Use Authorization (EUA). This EUA will remain in effect (meaning this test can be used) for the duration of the COVID-19 declaration under Section 564(b)(1) of the Act, 21 U.S.C. section 360bbb-3(b)(1), unless the authorization is terminated or revoked.  Performed at [facility], [address], [place], [place] [address]   Urine Culture     Status: None   Collection Time: 02/20/21  5:43 PM   Specimen: In/Out Cath Urine  Result Value Ref Range Status   Specimen Description   Final    IN/OUT CATH URINE Performed at [facility], [address], [place], [place] [address]    Special Requests   Final    NONE Performed at [facility], [address] [place], [place] [address]    Culture   Final    NO GROWTH Performed at [facility], [address], [place], [place] [address]    Report Status 02/21/2021 FINAL  Final  Blood Culture (routine x 2)     Status: None   Collection Time: 02/20/21  5:53 PM   Specimen: BLOOD  Result Value Ref Range Status   Specimen Description BLOOD BLOOD LEFT FOREARM  Final   Special Requests   Final    BOTTLES DRAWN AEROBIC AND ANAEROBIC Blood Culture adequate volume   Culture   Final    NO GROWTH 5 DAYS Performed at [facility], [address], [place], [place] [address]    Report Status  02/25/2021 FINAL  Final  MRSA Next Gen by PCR, Nasal     Status: None   Collection Time: 02/20/21 10:41  PM   Specimen: Nasal Mucosa; Nasal Swab  Result Value Ref Range Status   MRSA by PCR Next Gen NOT DETECTED NOT DETECTED Final    Comment: (NOTE) The GeneXpert MRSA Assay (FDA approved for NASAL specimens only), is one component of a comprehensive MRSA colonization surveillance program. It is not intended to diagnose MRSA infection nor to guide or monitor treatment for MRSA infections. Test performance is not FDA approved in patients less than 44 years old. Performed at [facility], [address] [place], [place] [address]   Respiratory (~20 pathogens) panel by PCR     Status: None   Collection Time: 02/21/21 10:06 AM   Specimen: Nasopharyngeal Swab; Respiratory  Result Value Ref Range Status   Adenovirus NOT DETECTED NOT DETECTED Final   Coronavirus 229E NOT DETECTED NOT DETECTED Final    Comment: (NOTE) The Coronavirus on the Respiratory Panel, DOES NOT test for the novel  Coronavirus (2019 nCoV)    Coronavirus HKU1 NOT DETECTED NOT DETECTED Final   Coronavirus NL63 NOT DETECTED NOT DETECTED Final   Coronavirus OC43 NOT DETECTED NOT DETECTED Final   Metapneumovirus NOT DETECTED NOT DETECTED Final   Rhinovirus / Enterovirus NOT DETECTED NOT DETECTED Final   Influenza A NOT DETECTED NOT DETECTED Final   Influenza B NOT DETECTED NOT DETECTED Final   Parainfluenza Virus 1 NOT DETECTED NOT DETECTED Final   Parainfluenza Virus 2 NOT DETECTED NOT DETECTED Final   Parainfluenza Virus 3 NOT DETECTED NOT DETECTED Final   Parainfluenza Virus 4 NOT DETECTED NOT DETECTED Final   Respiratory Syncytial Virus NOT DETECTED NOT DETECTED Final   Bordetella pertussis NOT DETECTED NOT DETECTED Final   Bordetella Parapertussis NOT DETECTED NOT DETECTED Final   Chlamydophila pneumoniae NOT DETECTED NOT DETECTED Final   Mycoplasma pneumoniae NOT DETECTED NOT DETECTED Final    Comment: Performed at [facility], [address] [place], [place] [address]  Body fluid culture w Gram  Stain     Status: None   Collection Time: 02/21/21  4:39 PM   Specimen: PATH Cytology Pleural fluid  Result Value Ref Range Status   Specimen Description   Final    PLEURAL Performed at [facility], [address] [place], [place] [address]    Special Requests   Final    NONE Performed at [facility], [address] [place], [place] [address]    Gram Stain   Final    WBC PRESENT, PREDOMINANTLY MONONUCLEAR NO ORGANISMS SEEN    Culture   Final    NO GROWTH 3 DAYS Performed at [facility], [address] [place], [place] [address]    Report Status 02/25/2021 FINAL  Final    Signed:  Lynden Oxford MD.  [facility] 02/26/2021, 12:16 PM

## 2021-02-26 NOTE — Progress Notes (Signed)
Physical Therapy Treatment Patient Details Name: Samuel Thomas MRN: [id number] DOB: [birth year] Today's Date: 02/26/2021   History of Present Illness Pt is a 85 y.o. male with medical history significant for DM, HTN, pumonary HTN, diastolic heart failure, paroxysmal A. fib, thyroid nodules, iron deficiency anemia, frequent falls, COPD, hearing impairment chronic opioid use, recently hospitalized for pneumonia from 10/27-11/4 and discharged to rehab , who presents to the ED from rehab due to blood sugar in the 500s for several days and altered mental status described as increased confusion. MD assessment includes: healthcare associated pneumonia, sepsis, pleural effusion, acute metabolic encephalopathy, debility, acute hypoxic respiratory failure, a fib with RVR, acute kidney injury, anemia, and HFpEF.   PT Comments    Pt was pleasant and motivated to participate during the session and put forth good effort throughout. Pt was able to complete all ther ex in bed. Pt required min assist for bed mobility with cuing for hand placement and proper sequencing for log rolling. Pt was able ambulate with min guard for safety in the room twice with rest breaks to check vitals. Pt demonstrated decreased cadence, unsteadiness on his feet, and decreased tolerance during ambulation. SpO2 remained in the low 90s and HR remained between 83-92 during the session. Pt will benefit from PT services in a SNF setting upon discharge to safely address deficits listed in patient problem list for decreased caregiver assistance and eventual return to PLOF.   Recommendations for follow up therapy are one component of a multi-disciplinary discharge planning process, led by the attending physician.  Recommendations may be updated based on patient status, additional functional criteria and insurance authorization.  Follow Up Recommendations  Skilled nursing-short term rehab (<3 hours/day)     Assistance Recommended at Discharge  Frequent or constant Supervision/Assistance  Equipment Recommendations  None recommended by PT    Recommendations for Other Services       Precautions / Restrictions Precautions Precautions: Fall Restrictions Weight Bearing Restrictions: No     Mobility  Bed Mobility Overal bed mobility: Needs Assistance Bed Mobility: Supine to Sit     Supine to sit: Min assist     General bed mobility comments: Increased time and effort, required cuing on rolling in bed for hand placement and needed assistance for bilat LE control    Transfers Overall transfer level: Needs assistance Equipment used: Rolling walker (2 wheels) Transfers: Sit to/from Stand Sit to Stand: Min guard           General transfer comment: Did not require cuing for STS, min guard for safety    Ambulation/Gait Ambulation/Gait assistance: Min guard Gait Distance (Feet): 15 Feet x2 Assistive device: Rolling walker (2 wheels) Gait Pattern/deviations: Step-through pattern;Decreased stride length;Decreased step length - right;Decreased step length - left Gait velocity: decreased     General Gait Details: Pt required min guard for safety during ambulation, demonstrated good understanding of directional cuing, was limited by fatigue, demonstrated decreased cadence and tolerance to ambulation   Stairs             Wheelchair Mobility    Modified Rankin (Stroke Patients Only)       Balance Overall balance assessment: Needs assistance Sitting-balance support: No upper extremity supported;Feet supported Sitting balance-Leahy Scale: Good     Standing balance support: Bilateral upper extremity supported;During functional activity Standing balance-Leahy Scale: Fair Standing balance comment: Required min gaurd for safety  Cognition Arousal/Alertness: Awake/alert Behavior During Therapy: WFL for tasks assessed/performed Overall Cognitive Status: Within Functional  Limits for tasks assessed                                          Exercises Total Joint Exercises Ankle Circles/Pumps: AROM;Strengthening;Both;10 reps;Supine Quad Sets: AROM;Strengthening;Both;10 reps;Supine Gluteal Sets: AROM;Strengthening;Both;10 reps;Supine Long Arc Quad: AROM;Strengthening;Both;10 reps;Seated Other Exercises Other Exercises: Pt education on log rolling in bed    General Comments        Pertinent Vitals/Pain Pain Assessment: 0-10 Pain Score: 7  Pain Location: chronic back pain Pain Descriptors / Indicators: Discomfort Pain Intervention(s): Repositioned    Home Living                          Prior Function            PT Goals (current goals can now be found in the care plan section) Acute Rehab PT Goals Patient Stated Goal: Get better and to get up and walking again PT Goal Formulation: With patient Time For Goal Achievement: 03/09/21 Potential to Achieve Goals: Good Progress towards PT goals: Progressing toward goals    Frequency    Min 2X/week      PT Plan Current plan remains appropriate    Co-evaluation              AM-PAC PT "6 Clicks" Mobility   Outcome Measure  Help needed turning from your back to your side while in a flat bed without using bedrails?: A Little Help needed moving from lying on your back to sitting on the side of a flat bed without using bedrails?: A Little Help needed moving to and from a bed to a chair (including a wheelchair)?: A Lot Help needed standing up from a chair using your arms (e.g., wheelchair or bedside chair)?: A Little Help needed to walk in hospital room?: A Little Help needed climbing 3-5 steps with a railing? : A Lot 6 Click Score: 16    End of Session Equipment Utilized During Treatment: Gait belt Activity Tolerance: Patient tolerated treatment well;Other (comment) (Pt needed to have BM) Patient left: Other (comment) (Pt left on toilet with directions to  use red chord when finished for assistance) Nurse Communication: Mobility status;Other (comment) (Pt left in bathrom for BM) PT Visit Diagnosis: Muscle weakness (generalized) (M62.81);Unsteadiness on feet (R26.81);History of falling (Z91.81);Difficulty in walking, not elsewhere classified (R26.2)     Time: 9147-8295 PT Time Calculation (min) (ACUTE ONLY): 27 min  Charges:                        Hildred Alamin SPT 02/26/21, 2:12 PM

## 2021-02-26 NOTE — TOC Transition Note (Signed)
Transition of Care [facility]) - CM/SW Discharge Note   Patient Details  Name: Samuel Thomas MRN: [id number] Date of Birth: [birth year]  Transition of Care [facility]) CM/SW Contact:  Maree Krabbe, LCSW Phone Number: 02/26/2021, 3:09 PM   Clinical Narrative:   Clinical Social Worker facilitated patient discharge including contacting patient family and facility to confirm patient discharge plans.  Clinical information faxed to facility and family agreeable with plan.  CSW arranged ambulance transport via [facility] to [facility] room [phone] .  RN to call for report prior to discharge.     Final next level of care: Skilled Nursing Facility Barriers to Discharge: No Barriers Identified   Patient Goals and CMS Choice Patient states their goals for this hospitalization and ongoing recovery are:: for pt to get better   Choice offered to / list presented to : Patient  Discharge Placement              Patient chooses bed at:  ([facility]) Patient to be transferred to facility by: [facility] Name of family member notified: DAVID Patient and family notified of of transfer: 02/26/21  Discharge Plan and Services                                     Social Determinants of Health (SDOH) Interventions     Readmission Risk Interventions Readmission Risk Prevention Plan 02/08/2021  Transportation Screening Complete  Medication Review [facility]) Complete  PCP or Specialist appointment within 3-5 days of discharge Complete  HRI or Home Care Consult Complete  SW Recovery Care/Counseling Consult Complete  Palliative Care Screening Not Applicable  Skilled Nursing Facility Complete  Some recent data might be hidden

## 2021-02-26 NOTE — Plan of Care (Signed)

## 2021-02-26 NOTE — Progress Notes (Signed)
Speech Language Pathology Treatment: Dysphagia  Patient Details Name: Samuel Thomas MRN: [id number] DOB: 1936-01-08 Today's Date: 02/26/2021 Time: 3016-0109 SLP Time Calculation (min) (ACUTE ONLY): 35 min  Assessment / Plan / Recommendation Clinical Impression  Pt seen for ongoing assessment of swallowing, toleration of his dysphagia diet, and education re: dysphagia/swallowing, diet consistency rec'd per MBSS yesterday w/ the compensatory strategies, and aspiration precautions, including recommendation for safer swallowing of Pills by use of a PUREE -- for all Pill swallowing. Pt has a Baseline of dysphagia per his report; confirmed by MBSS on 02/25/2021. Pt has had a recent admission w/ pneumonia; and pneumonia this admit per MD notes.    Discussed w/ pt the current diet consistency including use of Cut meats w/ moistened, cooked foods for ease of mastication and overall intake d/t Missing many Dentition. Discussed w/ pt the compensatory strategies rec'd to reduce pharyngeal residue(DRY swallow) and to aid airway clearing(Throat clear/Cough) -- these strategies can help reduce risk for aspiration. In addition, discussed aspiration precautions to include Small sips Slowly, No straws, and alternating foods/liquids. Rec'd Sitting fully Upright w/ all oral intake and reducing distractions including Talking at meals and clearing mouth fully b/t bites/sips. Pt practiced use of strategies and precautions w/ sips of Nectar liquids (4) -- no immediate, overt clinical s/s of aspiration noted during/post trials. Encouragement given for ALL Pills to be swallowed using a Puree for safer clearing of the oropharynx. Discussed the need for use of Nectar consistency at this time: pt is on Dys. Level 3 diet w/ Nectar liquids. Discussed the importance of using the Nectar consistency liquids to reduce aspiration events as was seen w/ thin liquids on MBSS -- which could increase risk for pneumonia. Pt agreed.  Handouts  given to pt and for chart on the compensatory strategies and aspiration precautions; Pill swallowing option; diet consistency and preparation suggestions and options; Nectar consistency liquids. Recommended f/u w/ ST services at SNF for ongoing education on dysphagia and precautions/strategies; discussion w/ pt/POA on overall GOC and QOL -- a Palliative Care consult may be beneficial for this also.      Recommend continue a Dysphagia level 3(Chopped meats) w/ Nectar liquids via Cup. Aspiration precautions/strategies; Pills in a Puree for safer swallowing. Recommendation given for f/u MBSS in ~4 weeks post recovery from acute illness -- PCP to order the MBSS. NSG updated, agreed.     HPI HPI: Pt is a 85 y.o. male with medical history significant for DM, HTN, diastolic heart failure, paroxysmal A. fib on Xarelto and Tikosyn, thyroid nodules, iron deficiency anemia, frequent falls, COPD, hearing impairment, chronic opioid use, previous alcohol use per POA, recently hospitalized for pneumonia from 10/27-11/4 and discharged to rehab , who presents to the ED from rehab due to blood sugar in the 500s for several days and altered mental status described as increased confusion.   CT of Chest: "Large right pleural effusion. There is decreased aeration in the  right lung, possibly compression atelectasis and pneumonia. There is  small 15 mm loculated fluid collection with possible small pocket of  air in the inferior aspect of right lower lobe. This may suggest  incidental bulla with fluid or infectious process with possible  small lung abscess. As far as seen, there are no central obstructing  lesions in the right hilum. There are slightly enlarged lymph nodes  in the mediastinum, possibly suggesting reactive hyperplasia of  lymph nodes.  Small left pleural effusion. There are small patchy infiltrates in  the posterior segment of left upper lobe suggesting  atelectasis/pneumonia.     Extensive coronary artery  calcifications are seen.". CT of Head: "No acute intracranial abnormality.  2. Stable atrophy, chronic microvascular ischemic white matter  disease and bilateral punctate thalamic lacunar infarcts.".      SLP Plan  Continue with current plan of care (at SNF)      Recommendations for follow up therapy are one component of a multi-disciplinary discharge planning process, led by the attending physician.  Recommendations may be updated based on patient status, additional functional criteria and insurance authorization.    Recommendations  Diet recommendations: Dysphagia 3 (mechanical soft);Nectar-thick liquid Liquids provided via: Cup;No straw Medication Administration: Whole meds with puree (for safer swallowing) Supervision: Patient able to self feed;Intermittent supervision to cue for compensatory strategies (setup) Compensations: Minimize environmental distractions;Slow rate;Small sips/bites;Lingual sweep for clearance of pocketing;Multiple dry swallows after each bite/sip;Follow solids with liquid;Clear throat intermittently Postural Changes and/or Swallow Maneuvers: Out of bed for meals;Seated upright 90 degrees;Upright 30-60 min after meal                General recommendations:  (Palliative Care consult for GOC) Oral Care Recommendations: Oral care BID;Oral care before and after PO;Staff/trained caregiver to provide oral care Follow Up Recommendations: Skilled nursing-short term rehab (<3 hours/day) Assistance recommended at discharge: Intermittent Supervision/Assistance SLP Visit Diagnosis: Dysphagia, oropharyngeal phase (R13.12);Dysphagia, pharyngoesophageal phase (R13.14) Plan: Continue with current plan of care (at SNF)       GO                 Jerilynn Som, MS, CCC-SLP Speech Language Pathologist Rehab Services [phone] [facility]  02/26/2021, 4:36 PM

## 2021-03-05 LAB — CHOLESTEROL, BODY FLUID: Cholesterol, Fluid: 46 mg/dL

## 2021-03-25 LAB — FUNGAL ORGANISM REFLEX

## 2021-03-25 LAB — FUNGUS CULTURE WITH STAIN

## 2021-03-25 LAB — FUNGUS CULTURE RESULT

## 2021-04-03 ENCOUNTER — Other Ambulatory Visit: Payer: Self-pay | Admitting: Geriatric Medicine

## 2021-04-03 DIAGNOSIS — R059 Cough, unspecified: Secondary | ICD-10-CM

## 2021-04-03 DIAGNOSIS — R1310 Dysphagia, unspecified: Secondary | ICD-10-CM

## 2021-04-04 ENCOUNTER — Other Ambulatory Visit: Payer: Self-pay | Admitting: Ophthalmology

## 2021-04-04 DIAGNOSIS — H532 Diplopia: Secondary | ICD-10-CM

## 2021-04-18 ENCOUNTER — Other Ambulatory Visit: Payer: Self-pay | Admitting: Ophthalmology

## 2021-04-18 DIAGNOSIS — H532 Diplopia: Secondary | ICD-10-CM

## 2021-04-21 ENCOUNTER — Ambulatory Visit: Admission: RE | Admit: 2021-04-21 | Payer: Medicare Other | Source: Ambulatory Visit

## 2021-05-01 ENCOUNTER — Ambulatory Visit
Admission: RE | Admit: 2021-05-01 | Discharge: 2021-05-01 | Disposition: A | Discharge status: Home / Self Care | Payer: Medicare Other | Source: Ambulatory Visit | Attending: Geriatric Medicine | Admitting: Geriatric Medicine

## 2021-05-01 DIAGNOSIS — R1310 Dysphagia, unspecified: Secondary | ICD-10-CM | POA: Insufficient documentation

## 2021-05-01 DIAGNOSIS — R059 Cough, unspecified: Secondary | ICD-10-CM | POA: Diagnosis present

## 2021-05-02 NOTE — Progress Notes (Signed)
Modified Barium Swallow Progress Note  Patient Details  Name: Samuel Thomas MRN: [id number] Date of Birth: 06-16-1935  Today's Date: 05/02/2021  Modified Barium Swallow completed.  Full report located under Chart Review in the Imaging Section.  Brief recommendations include the following:  Clinical Impression  MBSS on 02/25/2021 revealed delayed cough response to aspiration of thin liquids. At that time, it was recommended that pt consume dysphagia 3 diet with nectar thick liquids, medicine whole in puree. Pt reports that he "never started drinking thick liquids" and he eats regular food, consumes thin liquids via cup and straw with medicine whole with thin liquids at [facility].     Pt presents with mild oropharyngeal dysphagia. Pt presents with swallow initiation at the vallecula and pyriform sinuses. When consuming nectar thick liquids via cup and single sips of thin liquids via cup, he is able to contain bolus in his pyriform sinuses with no penetration/aspiration observed.However when consuming thin liquids via straw and when consuming large consecutive sips via cup, pt is unable to contain bolus with silent aspiration observed during swallow. Cued cough is not effective in clearing aspirates.   At this time,  recommend pt continue consuming regular diet with thin liquids VIA CUP - NO STRAW - medicine whole with thin liquids. Recommend adherence to strict aspiration precautions given pt's history of respiratory decline. These recommendations were sent with pt/staff to facility.   Swallow Evaluation Recommendations       SLP Diet Recommendations: Regular solids;Thin liquid   Liquid Administration via: Cup;No straw   Medication Administration: Whole meds with liquid   Supervision: Patient able to self feed   Compensations: Minimize environmental distractions;Slow rate;Small sips/bites   Postural Changes: Seated upright at 90 degrees   Oral Care Recommendations: Oral care  BID       Shannell Mikkelsen B. Dreama Saa M.S., CCC-SLP, [facility] Speech-Language Pathologist Rehabilitation Services Office [phone]  Kenika Sahm Dreama Saa 05/02/2021,12:27 PM

## 2021-06-25 ENCOUNTER — Inpatient Hospital Stay
Admission: EM | Admit: 2021-06-25 | Discharge: 2021-07-03 | DRG: 871 | Disposition: A | Discharge status: Skilled Nursing Facility | Payer: Medicare Other | Source: Skilled Nursing Facility | Attending: Internal Medicine | Admitting: Internal Medicine

## 2021-06-25 ENCOUNTER — Emergency Department: Payer: Medicare Other

## 2021-06-25 ENCOUNTER — Other Ambulatory Visit: Payer: Self-pay

## 2021-06-25 DIAGNOSIS — G319 Degenerative disease of nervous system, unspecified: Secondary | ICD-10-CM | POA: Diagnosis present

## 2021-06-25 DIAGNOSIS — I5032 Chronic diastolic (congestive) heart failure: Secondary | ICD-10-CM | POA: Diagnosis present

## 2021-06-25 DIAGNOSIS — N179 Acute kidney failure, unspecified: Secondary | ICD-10-CM | POA: Diagnosis present

## 2021-06-25 DIAGNOSIS — E1165 Type 2 diabetes mellitus with hyperglycemia: Secondary | ICD-10-CM | POA: Diagnosis present

## 2021-06-25 DIAGNOSIS — Z66 Do not resuscitate: Secondary | ICD-10-CM | POA: Diagnosis present

## 2021-06-25 DIAGNOSIS — E860 Dehydration: Secondary | ICD-10-CM | POA: Diagnosis present

## 2021-06-25 DIAGNOSIS — I10 Essential (primary) hypertension: Secondary | ICD-10-CM | POA: Diagnosis not present

## 2021-06-25 DIAGNOSIS — R531 Weakness: Secondary | ICD-10-CM | POA: Diagnosis present

## 2021-06-25 DIAGNOSIS — M549 Dorsalgia, unspecified: Secondary | ICD-10-CM | POA: Diagnosis present

## 2021-06-25 DIAGNOSIS — Z888 Allergy status to other drugs, medicaments and biological substances status: Secondary | ICD-10-CM | POA: Diagnosis not present

## 2021-06-25 DIAGNOSIS — Z79899 Other long term (current) drug therapy: Secondary | ICD-10-CM | POA: Diagnosis not present

## 2021-06-25 DIAGNOSIS — I480 Paroxysmal atrial fibrillation: Secondary | ICD-10-CM | POA: Insufficient documentation

## 2021-06-25 DIAGNOSIS — Z8701 Personal history of pneumonia (recurrent): Secondary | ICD-10-CM | POA: Diagnosis not present

## 2021-06-25 DIAGNOSIS — D696 Thrombocytopenia, unspecified: Secondary | ICD-10-CM

## 2021-06-25 DIAGNOSIS — Z7951 Long term (current) use of inhaled steroids: Secondary | ICD-10-CM | POA: Diagnosis not present

## 2021-06-25 DIAGNOSIS — Z7984 Long term (current) use of oral hypoglycemic drugs: Secondary | ICD-10-CM

## 2021-06-25 DIAGNOSIS — D638 Anemia in other chronic diseases classified elsewhere: Secondary | ICD-10-CM

## 2021-06-25 DIAGNOSIS — J189 Pneumonia, unspecified organism: Secondary | ICD-10-CM

## 2021-06-25 DIAGNOSIS — Z87891 Personal history of nicotine dependence: Secondary | ICD-10-CM | POA: Diagnosis not present

## 2021-06-25 DIAGNOSIS — G894 Chronic pain syndrome: Secondary | ICD-10-CM | POA: Diagnosis present

## 2021-06-25 DIAGNOSIS — Z20822 Contact with and (suspected) exposure to covid-19: Secondary | ICD-10-CM | POA: Diagnosis present

## 2021-06-25 DIAGNOSIS — R000 Tachycardia, unspecified: Secondary | ICD-10-CM | POA: Diagnosis not present

## 2021-06-25 DIAGNOSIS — J9601 Acute respiratory failure with hypoxia: Secondary | ICD-10-CM | POA: Diagnosis present

## 2021-06-25 DIAGNOSIS — A419 Sepsis, unspecified organism: Secondary | ICD-10-CM | POA: Diagnosis present

## 2021-06-25 DIAGNOSIS — Z7901 Long term (current) use of anticoagulants: Secondary | ICD-10-CM

## 2021-06-25 DIAGNOSIS — E785 Hyperlipidemia, unspecified: Secondary | ICD-10-CM | POA: Diagnosis present

## 2021-06-25 DIAGNOSIS — E119 Type 2 diabetes mellitus without complications: Secondary | ICD-10-CM

## 2021-06-25 DIAGNOSIS — F1190 Opioid use, unspecified, uncomplicated: Secondary | ICD-10-CM | POA: Diagnosis present

## 2021-06-25 DIAGNOSIS — E861 Hypovolemia: Secondary | ICD-10-CM | POA: Diagnosis present

## 2021-06-25 DIAGNOSIS — I110 Hypertensive heart disease with heart failure: Secondary | ICD-10-CM | POA: Diagnosis present

## 2021-06-25 DIAGNOSIS — G9341 Metabolic encephalopathy: Secondary | ICD-10-CM | POA: Diagnosis present

## 2021-06-25 DIAGNOSIS — Z880 Allergy status to penicillin: Secondary | ICD-10-CM

## 2021-06-25 DIAGNOSIS — J449 Chronic obstructive pulmonary disease, unspecified: Secondary | ICD-10-CM | POA: Diagnosis present

## 2021-06-25 DIAGNOSIS — Z794 Long term (current) use of insulin: Secondary | ICD-10-CM

## 2021-06-25 DIAGNOSIS — J440 Chronic obstructive pulmonary disease with acute lower respiratory infection: Secondary | ICD-10-CM | POA: Diagnosis present

## 2021-06-25 DIAGNOSIS — J9611 Chronic respiratory failure with hypoxia: Secondary | ICD-10-CM

## 2021-06-25 DIAGNOSIS — I4891 Unspecified atrial fibrillation: Secondary | ICD-10-CM | POA: Diagnosis not present

## 2021-06-25 LAB — CBC WITH DIFFERENTIAL/PLATELET
Abs Immature Granulocytes: 0.01 10*3/uL (ref 0.00–0.07)
Basophils Absolute: 0 10*3/uL (ref 0.0–0.1)
Basophils Relative: 0 %
Eosinophils Absolute: 0.1 10*3/uL (ref 0.0–0.5)
Eosinophils Relative: 2 %
HCT: 32.7 % — ABNORMAL LOW (ref 39.0–52.0)
Hemoglobin: 10.1 g/dL — ABNORMAL LOW (ref 13.0–17.0)
Immature Granulocytes: 0 %
Lymphocytes Relative: 13 %
Lymphs Abs: 0.6 10*3/uL — ABNORMAL LOW (ref 0.7–4.0)
MCH: 27.3 pg (ref 26.0–34.0)
MCHC: 30.9 g/dL (ref 30.0–36.0)
MCV: 88.4 fL (ref 80.0–100.0)
Monocytes Absolute: 0.2 10*3/uL (ref 0.1–1.0)
Monocytes Relative: 5 %
Neutro Abs: 3.8 10*3/uL (ref 1.7–7.7)
Neutrophils Relative %: 80 %
Platelets: 128 10*3/uL — ABNORMAL LOW (ref 150–400)
RBC: 3.7 MIL/uL — ABNORMAL LOW (ref 4.22–5.81)
RDW: 13.2 % (ref 11.5–15.5)
WBC: 4.8 10*3/uL (ref 4.0–10.5)
nRBC: 0 % (ref 0.0–0.2)

## 2021-06-25 LAB — COMPREHENSIVE METABOLIC PANEL
ALT: 15 U/L (ref 0–44)
AST: 22 U/L (ref 15–41)
Albumin: 2.1 g/dL — ABNORMAL LOW (ref 3.5–5.0)
Alkaline Phosphatase: 60 U/L (ref 38–126)
Anion gap: 10 (ref 5–15)
BUN: 29 mg/dL — ABNORMAL HIGH (ref 8–23)
CO2: 28 mmol/L (ref 22–32)
Calcium: 8.7 mg/dL — ABNORMAL LOW (ref 8.9–10.3)
Chloride: 98 mmol/L (ref 98–111)
Creatinine, Ser: 1.55 mg/dL — ABNORMAL HIGH (ref 0.61–1.24)
GFR, Estimated: 44 mL/min — ABNORMAL LOW (ref >60)
Glucose, Bld: 136 mg/dL — ABNORMAL HIGH (ref 70–99)
Potassium: 4.1 mmol/L (ref 3.5–5.1)
Sodium: 136 mmol/L (ref 135–145)
Total Bilirubin: 0.3 mg/dL (ref 0.3–1.2)
Total Protein: 6.2 g/dL — ABNORMAL LOW (ref 6.5–8.1)

## 2021-06-25 LAB — TROPONIN I (HIGH SENSITIVITY)
Troponin I (High Sensitivity): 19 ng/L — ABNORMAL HIGH (ref <18)
Troponin I (High Sensitivity): 18 ng/L — ABNORMAL HIGH (ref <18)

## 2021-06-25 LAB — RESP PANEL BY RT-PCR (FLU A&B, COVID) ARPGX2
Influenza A by PCR: NEGATIVE
Influenza B by PCR: NEGATIVE
SARS Coronavirus 2 by RT PCR: NEGATIVE

## 2021-06-25 MED ORDER — IOHEXOL 350 MG/ML SOLN
60.0000 mL | Freq: Once | INTRAVENOUS | Status: AC | PRN
  Administered 2021-06-25: 60 mL via INTRAVENOUS

## 2021-06-25 MED ORDER — VANCOMYCIN HCL IN DEXTROSE 1-5 GM/200ML-% IV SOLN: 1000.0000 mg | Freq: Once | INTRAVENOUS | Status: DC

## 2021-06-25 MED ORDER — SODIUM CHLORIDE 0.9 % IV BOLUS
500.0000 mL | Freq: Once | INTRAVENOUS | Status: AC
Start: 2021-06-25 — End: 2021-06-25
  Administered 2021-06-25: 500 mL via INTRAVENOUS

## 2021-06-25 MED ORDER — SODIUM CHLORIDE 0.9 % IV SOLN
2.0000 g | Freq: Once | INTRAVENOUS | Status: AC
  Administered 2021-06-25: 2 g via INTRAVENOUS
  Filled 2021-06-25: qty 2

## 2021-06-25 NOTE — Assessment & Plan Note (Signed)
-   Patient at baseline requirement of 2 L ?

## 2021-06-25 NOTE — Assessment & Plan Note (Addendum)
Stable.  No evidence of overt bleeding.  Continue to monitor. ?

## 2021-06-25 NOTE — Assessment & Plan Note (Addendum)
Continue Xarelto 

## 2021-06-25 NOTE — Assessment & Plan Note (Addendum)
Blood pressure reasonably well controlled. ?

## 2021-06-25 NOTE — Assessment & Plan Note (Addendum)
Secondary to acute illness.  Physical therapy evaluation.  Already is at a skilled nursing facility. ?

## 2021-06-25 NOTE — Assessment & Plan Note (Addendum)
Sepsis present on admission ? ?CT angiogram chest showed multifocal pneumonia.  Patient with previous history of pneumonia. Tachycardia and tachypnea and source of infection without fever or leukocytosis. ?Previously seen by speech therapy and was placed on dysphagia diet but patient apparently does not like the thickened liquids.  Seen by speech therapy again. ?Patient placed on vancomycin and cefepime.  Cultures were negative. MRSA PCR negative.  Vancomycin was discontinued.  Cefepime changed over to [facility].  Continue Omnicef. ?Remains afebrile.  WBC is normal. ?Respiratory status is stable.  Seems to be off of oxygen this morning.  Continue to monitor. ?Considering that he has had recurrent hospitalization for similar issues he will benefit from palliative care evaluation for goals of care.  This can be done at a skilled nursing facility. ?

## 2021-06-25 NOTE — ED Provider Notes (Signed)
? ?[facility] ?Provider Note ? ? ? Event Date/Time  ? First MD Initiated Contact with Patient 06/25/21 2140   ?  (approximate) ? ? ?History  ? ?Shortness of Breath and Weakness ? ? ?HPI ?Samuel Thomas is a 86 y.o. male  who, per discharge summary dated 02/26/21 has history of COPD, had admission for pneumonia, who presents from living facility for increasing weakness as well as some shortness of breath.  Grandson who is at the bedside states that he first noticed that his grandfather was having some weakness a little over a week ago.  This weakness has continued and gotten worse since then.  Patient states that has been accompanied by some shortness of breath.  He however denies any chest pain.  Lucila Maine states that patient is at risk for aspiration and has had pneumonias in the past.  Patient denies any fevers. Denies any change in urination. ? ?Physical Exam  ? ?Triage Vital Signs: ?ED Triage Vitals  ?Enc Vitals Group  ?   BP 06/25/21 1846 (!) 103/59  ?   Pulse Rate 06/25/21 1846 (!) 115  ?   Resp 06/25/21 1846 19  ?   Temp 06/25/21 1846 98.6 ?F (37 ?C)  ?   Temp Source 06/25/21 1846 Oral  ?   SpO2 06/25/21 1846 97 %  ?   Weight 06/25/21 1852 167 lb (75.8 kg)  ?   Height 06/25/21 1852 5\' 7"  (1.702 m)  ?   Head Circumference --   ?   Peak Flow --   ?   Pain Score 06/25/21 1851 4  ? ?Most recent vital signs: ?Vitals:  ? 06/25/21 1846  ?BP: (!) 103/59  ?Pulse: (!) 115  ?Resp: 19  ?Temp: 98.6 ?F (37 ?C)  ?SpO2: 97%  ? ? ?General: Awake, no distress. Appears fatigued. ?CV:  Good peripheral perfusion. Irregular rhythm ?Resp:  Normal effort. Lungs clear to auscultation ?Abd:  No distention. Non tender. ? ? ?ED Results / Procedures / Treatments  ? ?Labs ?(all labs ordered are listed, but only abnormal results are displayed) ?Labs Reviewed  ?CBC WITH DIFFERENTIAL/PLATELET - Abnormal; Notable for the following components:  ?    Result Value  ? RBC 3.70 (*)   ? Hemoglobin 10.1 (*)   ? HCT 32.7 (*)   ?  Platelets 128 (*)   ? Lymphs Abs 0.6 (*)   ? All other components within normal limits  ?COMPREHENSIVE METABOLIC PANEL - Abnormal; Notable for the following components:  ? Glucose, Bld 136 (*)   ? BUN 29 (*)   ? Creatinine, Ser 1.55 (*)   ? Calcium 8.7 (*)   ? Total Protein 6.2 (*)   ? Albumin 2.1 (*)   ? GFR, Estimated 44 (*)   ? All other components within normal limits  ?TROPONIN I (HIGH SENSITIVITY) - Abnormal; Notable for the following components:  ? Troponin I (High Sensitivity) 19 (*)   ? All other components within normal limits  ?URINALYSIS, ROUTINE W REFLEX MICROSCOPIC  ?TROPONIN I (HIGH SENSITIVITY)  ? ? ? ?EKG ? ? ?I, 06/27/21, attending physician, personally viewed and interpreted this EKG ? ?EKG Time: 1848 ?Rate: 114 ?Rhythm: atrial fibrillation ?Axis: normal ?Intervals: qtc 465 ?QRS: narrow ?ST changes: no st elevation ?Impression: abnormal ekg ? ?RADIOLOGY ?I independently interpreted and visualized the cxr. My interpretation: opacity in right lungbase ?Radiology interpretation:  ?IMPRESSION:  ?1. Focal subpleural density in the posterior lung, may represent  ?atelectasis or  infiltrate.  ?2. Chronic interstitial coarsening. Atypical pneumonia is not  ?excluded.  ? ?CT angio PE ?IMPRESSION:  ?1. No evidence of significant pulmonary embolus.  ?2. Patchy areas of airspace and interstitial infiltration in the  ?lungs most likely representing multifocal pneumonia. Asymmetrical  ?edema would be less likely.  ?3. Small bilateral pleural effusions with basilar atelectasis.  ?4. Cardiac enlargement.  ?5. Aortic atherosclerosis.  ? ? ? ?PROCEDURES: ? ?Critical Care performed: No ? ?Procedures ? ? ?MEDICATIONS ORDERED IN ED: ?Medications - No data to display ? ? ?IMPRESSION / MDM / ASSESSMENT AND PLAN / ED COURSE  ?I reviewed the triage vital signs and the nursing notes. ?             ?               ? ?Differential diagnosis includes, but is not limited to, pneumonia, dehydration, electrolyte  abnormality, ACS, COVID. ? ?Patient presents from living facility today because of concerns for increasing weakness as well as some shortness of breath.  Does appear symptoms started slightly over 1 week ago.  The patient does have history of pneumonias.  Blood work here without any leukocytosis.  CMP with slightly elevated creatinine.  Initial troponin was slightly elevated however patient does have baseline elevation and repeat was essentially unchanged.  At this time I have low suspicion for ACS.  Chest x-ray was suggestive of possible pneumonia however given lack of white count in the blood I did obtain a CT for further evaluation.  This was concerning for multifocal pneumonia.  I do think this could explain this patient's symptoms.  We will start IV antibiotics.  Discussed findings with patient and family.  Discussed with Dr. Para March with the hospitalist service who will plan on admission. ? ? ?FINAL CLINICAL IMPRESSION(S) / ED DIAGNOSES  ? ?Final diagnoses:  ?Weakness  ?Pneumonia due to infectious organism, unspecified laterality, unspecified part of lung  ? ? ? ?Note:  This document was prepared using Dragon voice recognition software and may include unintentional dictation errors. ? ?  ?Phineas Semen, MD ?06/25/21 2326 ? ?

## 2021-06-25 NOTE — Assessment & Plan Note (Addendum)
Stable.  Continue home medications. 

## 2021-06-25 NOTE — Assessment & Plan Note (Addendum)
Hypovolemia noted at admission. Echo from 10/29 with EF 55 to 60% ?Patient not on diuretics or beta-blockers or ACE/ARB ?IV fluids subsequently discontinued. ?

## 2021-06-25 NOTE — ED Triage Notes (Signed)
Pt presents to ER via ems from [facility] with c/o weakness and sob x3-4 days.  Pt states he is having increased difficulty w/ambulation.  Pt denies any recent falls.  Pt states he has hx of COPD and wears 2L O2 at all times.  Pt A&O x4 at this time in NAD.   ?

## 2021-06-25 NOTE — Assessment & Plan Note (Addendum)
Counts were 128,000, Down from over 300,000 about 4 months.  Likely due to acute illness. Platelet counts improved.   ?

## 2021-06-25 NOTE — Assessment & Plan Note (Addendum)
Known history of atrial fibrillation.  Continue diltiazem and dofetilide.  Continue Xarelto.  Tachycardia at the time of admission most likely due to dehydration/infection.   ?Atrial fibrillation has been poorly controlled during this hospital stay.  Dose of Cardizem was increased.  Metoprolol was added.   ?Heart rate has improved.  Continue current treatment for now. ?Continue dofetilide.  Monitor electrolytes periodically. replace potassium today. ?

## 2021-06-25 NOTE — H&P (Signed)
?History and Physical  ? ? ?Patient: Samuel Thomas ZSW:[id number] DOB: February 04, 1936 ?DOA: 06/25/2021 ?DOS: the patient was seen and examined on 06/25/2021 ?PCP: Jenell Milliner, MD  ?Patient coming from: SNF ? ?Chief Complaint:  ?Chief Complaint  ?Patient presents with  ? Shortness of Breath  ? Weakness  ? ? ?HPI: Samuel Thomas is a 86 y.o. male with medical history significant for  DM, HTN, diastolic heart failure, paroxysmal A. fib on Xarelto and Tikosyn, thyroid nodules, iron deficiency anemia, frequent falls, COPD not currently on home oxygen chronic opioid use, hospitalized for pneumonia in October and November 2022, strep antigen positive in November, who presents to the ED with a 3 to 4-day history of generalized weakness and shortness of breath.  He has had no chest pain, fever or chills.  Grandson contributes the history.  He states that he sees his grandfather every week and notes that for the past few days he has been very lethargic and not as interactive as usual and seems to be a bit confused.  He also states that he has had problems with aspiration in the past but does not like the taste of thickened liquids. ?Data reviewed: ?ED course: On arrival, tachycardic to 115 with BP 103/59(though patient received clonidine earlier for elevated BP at SNF).  Tachypneic to 28 with O2 sat 97% on home flow rate of 2 L.  Labs reviewed and notable findings include normal WBC of 4800, hemoglobin 10.1 which is baseline, mild thrombocytopenia 128,000.  Creatinine 1.55, up from baseline of 1.08.  Troponin 19-18 ?EKG, personally viewed and interpreted: Rapid A-fib at 114 with no acute ST-T wave changes ?CTA chest: No evidence of significant pulmonary embolus. Patchy areas of airspace and interstitial infiltration in the ?lungs most likely representing multifocal pneumonia. Asymmetrical edema would be less likely. ? ?Patient started on cefepime and vancomycin and given an IV fluid bolus.  Hospitalist consulted for  admission. ? ?Review of Systems: As mentioned in the history of present illness. All other systems reviewed and are negative. ?Past Medical History:  ?Diagnosis Date  ? COPD (chronic obstructive pulmonary disease) (HCC)   ? DM2 (diabetes mellitus, type 2) (HCC)   ? HLD (hyperlipidemia)   ? HTN (hypertension)   ? ?Past Surgical History:  ?Procedure Laterality Date  ? HERNIA REPAIR    ? ?Social History:  reports that he quit smoking about 47 years ago. His smoking use included cigarettes. He has never used smokeless tobacco. He reports that he does not drink alcohol and does not use drugs. ? ?Allergies  ?Allergen Reactions  ? Penicillins Swelling and Rash  ?  Break outs swelling ?Other reaction(s): SWELLING in 1955  ? Apixaban Other (See Comments)  ?  Cold intolerance ?Cold intolerance ?  ? Prednisone Rash and Other (See Comments)  ?  Makes mean  ?Significant mood and behavioral changes - 'felt like I wasn't in control of anything'  ? ? ?History reviewed. No pertinent family history. ? ?Prior to Admission medications   ?Medication Sig Start Date End Date Taking? Authorizing Provider  ?albuterol (VENTOLIN HFA) 108 (90 Base) MCG/ACT inhaler Ventolin HFA 90 mcg/actuation aerosol inhaler ? Inhale 2 puffs by mouth every 6 hours as needed for wheezing 10/06/19   [provider]  ?atorvastatin (LIPITOR) 20 MG tablet Take 20 mg by mouth at bedtime.    [provider]  ?budesonide (PULMICORT) 0.5 MG/2ML nebulizer solution Inhale 0.5 mg into the lungs daily. 02/07/15   [provider]  ?  Calcium Carb-Cholecalciferol 600-400 MG-UNIT TABS Take 1 tablet by mouth daily.    [provider]  ?cetirizine (ZYRTEC) 10 MG tablet cetirizine 10 mg tablet ? Take 1 tablet every day by oral route.    [provider]  ?colchicine 0.6 MG tablet Take 0.6 mg by mouth as needed (gout). 06/13/20 06/13/21  [provider]  ?diltiazem (CARDIZEM CD) 240 MG 24 hr capsule Take 1 capsule (240 mg total) by  mouth daily. 02/14/21   Arnetha CourserAmin, Sumayya, MD  ?dofetilide (TIKOSYN) 125 MCG capsule Take 125 mcg by mouth 2 (two) times daily. 11/29/17   [provider]  ?Ferrous Sulfate-C-Folic Acid 105-500-0.8 MG TBCR Take 1 tablet by mouth 2 (two) times daily. 02/13/21   Arnetha CourserAmin, Sumayya, MD  ?finasteride (PROSCAR) 5 MG tablet Take 5 mg by mouth daily. 10/31/18   [provider]  ?fluticasone (FLONASE) 50 MCG/ACT nasal spray Place 1-2 sprays into both nostrils daily.    [provider]  ?folic acid (FOLVITE) 1 MG tablet Take 1 mg by mouth daily.    [provider]  ?furosemide (LASIX) 40 MG tablet Take 1 tablet (40 mg total) by mouth daily. 12/07/13   Zannie CoveJoseph, Preetha, MD  ?guaiFENesin-dextromethorphan [facility](ROBITUSSIN DM) 100-10 MG/5ML syrup Take 10 mLs by mouth every 4 (four) hours as needed for cough. 02/13/21   Arnetha CourserAmin, Sumayya, MD  ?insulin glargine (LANTUS) 100 unit/mL SOPN Inject 10 Units into the skin at bedtime.    [provider]  ?insulin lispro (HUMALOG) 100 UNIT/ML injection Inject 6 Units into the skin 3 (three) times daily before meals.    [provider]  ?ipratropium-albuterol (DUONEB) 0.5-2.5 (3) MG/3ML SOLN Inhale into the lungs. 04/16/19 11/10/20  [provider]  ?Lactobacillus Acid-Pectin (ACIDOPHILUS/PECTIN) CAPS Acidophilus Probiotic 100 million cell-10 mg capsule ? Take 1 capsule every day by oral route.    [provider]  ?magnesium oxide (MAG-OX) 400 (240 Mg) MG tablet Take 1 tablet (400 mg total) by mouth 2 (two) times daily. 02/13/21   Arnetha CourserAmin, Sumayya, MD  ?metFORMIN (GLUCOPHAGE) 1000 MG tablet Take 1,000 mg by mouth 2 (two) times daily. 12/17/20   [provider]  ?Multiple Vitamin (ONE DAILY) tablet Take 1 tablet by mouth daily.    [provider]  ?Nutritional Supplements (FEEDING SUPPLEMENT, NEPRO CARB STEADY,) LIQD Take 237 mLs by mouth 2 (two) times daily between meals. 02/13/21   Arnetha CourserAmin, Sumayya, MD  ?omeprazole (PRILOSEC) 20 MG  capsule Take 20 mg by mouth daily. 06/26/20 06/26/21  [provider]  ?polyethylene glycol (MIRALAX / GLYCOLAX) 17 g packet Take 17 g by mouth daily as needed for mild constipation. 02/13/21   Arnetha CourserAmin, Sumayya, MD  ?potassium chloride SA (KLOR-CON) 20 MEQ tablet Take 1 tablet (20 mEq total) by mouth 2 (two) times daily. 02/26/21   Rolly SalterPatel, Pranav M, MD  ?rivaroxaban (XARELTO) 20 MG TABS tablet Take 1 tablet (20 mg total) by mouth daily with supper. 02/13/21   Arnetha CourserAmin, Sumayya, MD  ?senna-docusate (SENOKOT-S) 8.6-50 MG tablet Senna with Docusate Sodium 8.6 mg-50 mg tablet ? Take 2 tablets twice a day by oral route.    [provider]  ?tamsulosin (FLOMAX) 0.4 MG CAPS capsule Take 0.4 mg by mouth daily.    [provider]  ?tiotropium (SPIRIVA HANDIHALER) 18 MCG inhalation capsule Spiriva with HandiHaler 18 mcg and inhalation capsules    [provider]  ?TRUETRACK TEST test strip  02/21/14   [provider]  ? ? ?Physical Exam: ?Vitals:  ?  06/25/21 1852 06/25/21 2130 06/25/21 2141 06/25/21 2230  ?BP:  116/71  128/63  ?Pulse:  (!) 105  93  ?Resp:  (!) 28  (!) 26  ?Temp:      ?TempSrc:      ?SpO2:  97% 97% 97%  ?Weight: 75.8 kg     ?Height: 5\' 7"  (1.702 m)     ? ?Physical Exam ?Vitals and nursing note reviewed.  ?Constitutional:   ?   General: He is sleeping. He is not in acute distress. ?   Appearance: Normal appearance.  ?HENT:  ?   Head: Normocephalic and atraumatic.  ?Cardiovascular:  ?   Rate and Rhythm: Tachycardia present. Rhythm irregular.  ?   Pulses: Normal pulses.  ?   Heart sounds: Normal heart sounds. No murmur heard. ?Pulmonary:  ?   Effort: Tachypnea present.  ?   Breath sounds: Normal breath sounds. No wheezing or rhonchi.  ?   Comments: Faint crackles ?Abdominal:  ?   General: Bowel sounds are normal.  ?   Palpations: Abdomen is soft.  ?   Tenderness: There is no abdominal tenderness.  ?Musculoskeletal:     ?   General: No swelling or tenderness. Normal range of  motion.  ?   Cervical back: Normal range of motion and neck supple.  ?Skin: ?   General: Skin is warm and dry.  ?Neurological:  ?   General: No focal deficit present.  ?   Mental Status: He is easily aroused. Mental status is a

## 2021-06-25 NOTE — ED Triage Notes (Signed)
Pt comes into the ED via [facility] from [facility] c/o weakness.  Initial call out was for HTN at 174/115 and gave him 0.1 clonidine 15 minutes prior to EMS arrival.  EMS got 106/64.  [facility] also reports a HR at 140 this morning.  H/o a-fib.  Pt c/o weakness for several days.  ? ?CBg 144 ?106/64 ?103 ?97% 2L (baseline) ?

## 2021-06-25 NOTE — ED Notes (Signed)
Samuel Thomas - [phone] ?

## 2021-06-25 NOTE — Assessment & Plan Note (Addendum)
HbA1c 7.1 in October.  Patient on Lantus prior to admission.  Was placed on hold.  CBGs were poorly controlled.  Lantus was reinitiated.  CBGs are better now.  Continue to monitor. ?

## 2021-06-25 NOTE — Assessment & Plan Note (Addendum)
Creatinine was noted to be 1.55 on presentation.  Baseline 0.73.  Improved with IV fluids.  Monitor urine output. ?

## 2021-06-26 DIAGNOSIS — I10 Essential (primary) hypertension: Secondary | ICD-10-CM | POA: Diagnosis not present

## 2021-06-26 DIAGNOSIS — J9601 Acute respiratory failure with hypoxia: Secondary | ICD-10-CM | POA: Diagnosis not present

## 2021-06-26 DIAGNOSIS — N179 Acute kidney failure, unspecified: Secondary | ICD-10-CM | POA: Diagnosis not present

## 2021-06-26 DIAGNOSIS — J189 Pneumonia, unspecified organism: Secondary | ICD-10-CM | POA: Diagnosis not present

## 2021-06-26 LAB — GLUCOSE, CAPILLARY
Glucose-Capillary: 134 mg/dL — ABNORMAL HIGH (ref 70–99)
Glucose-Capillary: 152 mg/dL — ABNORMAL HIGH (ref 70–99)
Glucose-Capillary: 168 mg/dL — ABNORMAL HIGH (ref 70–99)
Glucose-Capillary: 259 mg/dL — ABNORMAL HIGH (ref 70–99)
Glucose-Capillary: 277 mg/dL — ABNORMAL HIGH (ref 70–99)

## 2021-06-26 LAB — MRSA NEXT GEN BY PCR, NASAL: MRSA by PCR Next Gen: NOT DETECTED

## 2021-06-26 MED ORDER — ALBUTEROL SULFATE (2.5 MG/3ML) 0.083% IN NEBU: 2.5000 mg | INHALATION_SOLUTION | RESPIRATORY_TRACT | Status: DC | PRN

## 2021-06-26 MED ORDER — BUDESONIDE 0.5 MG/2ML IN SUSP
0.5000 mg | Freq: Every day | RESPIRATORY_TRACT | Status: DC
  Administered 2021-06-28 – 2021-07-03 (×6): 0.5 mg via RESPIRATORY_TRACT
  Filled 2021-06-26 (×7): qty 2

## 2021-06-26 MED ORDER — FINASTERIDE 5 MG PO TABS
5.0000 mg | ORAL_TABLET | Freq: Every day | ORAL | Status: DC
  Administered 2021-06-26 – 2021-07-03 (×8): 5 mg via ORAL
  Filled 2021-06-26 (×8): qty 1

## 2021-06-26 MED ORDER — RIVAROXABAN 15 MG PO TABS
15.0000 mg | ORAL_TABLET | Freq: Every day | ORAL | Status: DC
  Administered 2021-06-26 – 2021-07-01 (×6): 15 mg via ORAL
  Filled 2021-06-26 (×7): qty 1

## 2021-06-26 MED ORDER — ONDANSETRON HCL 4 MG PO TABS: 4.0000 mg | ORAL_TABLET | Freq: Four times a day (QID) | ORAL | Status: DC | PRN

## 2021-06-26 MED ORDER — SODIUM CHLORIDE 0.9 % IV SOLN: INTRAVENOUS | Status: DC

## 2021-06-26 MED ORDER — POLYETHYLENE GLYCOL 3350 17 G PO PACK: 17.0000 g | PACK | Freq: Every day | ORAL | Status: DC | PRN

## 2021-06-26 MED ORDER — DILTIAZEM HCL ER COATED BEADS 120 MG PO CP24
120.0000 mg | ORAL_CAPSULE | Freq: Every day | ORAL | Status: DC
  Administered 2021-06-27: 120 mg via ORAL
  Filled 2021-06-26: qty 1

## 2021-06-26 MED ORDER — SODIUM CHLORIDE 0.9 % IV SOLN: 500.0000 mg | INTRAVENOUS | Status: DC

## 2021-06-26 MED ORDER — SODIUM CHLORIDE 0.9 % IV SOLN
2.0000 g | Freq: Two times a day (BID) | INTRAVENOUS | Status: DC
  Administered 2021-06-26 – 2021-06-27 (×4): 2 g via INTRAVENOUS
  Filled 2021-06-26 (×5): qty 2

## 2021-06-26 MED ORDER — VANCOMYCIN HCL 1750 MG/350ML IV SOLN
1750.0000 mg | Freq: Once | INTRAVENOUS | Status: AC
  Administered 2021-06-26: 1750 mg via INTRAVENOUS
  Filled 2021-06-26: qty 350

## 2021-06-26 MED ORDER — LORATADINE 10 MG PO TABS
10.0000 mg | ORAL_TABLET | Freq: Every day | ORAL | Status: DC
  Administered 2021-06-26 – 2021-07-03 (×8): 10 mg via ORAL
  Filled 2021-06-26 (×8): qty 1

## 2021-06-26 MED ORDER — ONDANSETRON HCL 4 MG/2ML IJ SOLN: 4.0000 mg | Freq: Four times a day (QID) | INTRAMUSCULAR | Status: DC | PRN

## 2021-06-26 MED ORDER — ACETAMINOPHEN 650 MG RE SUPP: 650.0000 mg | Freq: Four times a day (QID) | RECTAL | Status: DC | PRN

## 2021-06-26 MED ORDER — INSULIN ASPART 100 UNIT/ML IJ SOLN
0.0000 [IU] | Freq: Every day | INTRAMUSCULAR | Status: DC
  Administered 2021-06-27 – 2021-06-29 (×3): 2 [IU] via SUBCUTANEOUS
  Filled 2021-06-26 (×3): qty 1

## 2021-06-26 MED ORDER — TIOTROPIUM BROMIDE MONOHYDRATE 18 MCG IN CAPS
18.0000 ug | ORAL_CAPSULE | Freq: Every day | RESPIRATORY_TRACT | Status: DC
  Administered 2021-06-26 – 2021-07-03 (×8): 18 ug via RESPIRATORY_TRACT
  Filled 2021-06-26 (×2): qty 5

## 2021-06-26 MED ORDER — VANCOMYCIN HCL 1750 MG/350ML IV SOLN
1750.0000 mg | Freq: Once | INTRAVENOUS | Status: DC
  Filled 2021-06-26: qty 350

## 2021-06-26 MED ORDER — INSULIN ASPART 100 UNIT/ML IJ SOLN
0.0000 [IU] | Freq: Three times a day (TID) | INTRAMUSCULAR | Status: DC
  Administered 2021-06-26: 8 [IU] via SUBCUTANEOUS
  Administered 2021-06-26: 2 [IU] via SUBCUTANEOUS
  Administered 2021-06-26: 8 [IU] via SUBCUTANEOUS
  Administered 2021-06-27 (×2): 3 [IU] via SUBCUTANEOUS
  Administered 2021-06-27: 5 [IU] via SUBCUTANEOUS
  Administered 2021-06-28: 3 [IU] via SUBCUTANEOUS
  Administered 2021-06-28: 5 [IU] via SUBCUTANEOUS
  Administered 2021-06-28: 3 [IU] via SUBCUTANEOUS
  Administered 2021-06-29 – 2021-06-30 (×5): 5 [IU] via SUBCUTANEOUS
  Administered 2021-06-30: 3 [IU] via SUBCUTANEOUS
  Administered 2021-07-01: 2 [IU] via SUBCUTANEOUS
  Administered 2021-07-01: 3 [IU] via SUBCUTANEOUS
  Administered 2021-07-01: 2 [IU] via SUBCUTANEOUS
  Administered 2021-07-02: 3 [IU] via SUBCUTANEOUS
  Administered 2021-07-02: 2 [IU] via SUBCUTANEOUS
  Administered 2021-07-02: 5 [IU] via SUBCUTANEOUS
  Administered 2021-07-03 (×2): 3 [IU] via SUBCUTANEOUS
  Filled 2021-06-26 (×23): qty 1

## 2021-06-26 MED ORDER — TAMSULOSIN HCL 0.4 MG PO CAPS
0.4000 mg | ORAL_CAPSULE | Freq: Every day | ORAL | Status: DC
  Administered 2021-06-26 – 2021-07-03 (×8): 0.4 mg via ORAL
  Filled 2021-06-26 (×8): qty 1

## 2021-06-26 MED ORDER — ATORVASTATIN CALCIUM 20 MG PO TABS
20.0000 mg | ORAL_TABLET | Freq: Every day | ORAL | Status: DC
  Administered 2021-06-26 – 2021-07-02 (×7): 20 mg via ORAL
  Filled 2021-06-26 (×7): qty 1

## 2021-06-26 MED ORDER — SODIUM CHLORIDE 0.9 % IV SOLN: 2.0000 g | INTRAVENOUS | Status: DC

## 2021-06-26 MED ORDER — HYDROCODONE-ACETAMINOPHEN 5-325 MG PO TABS
1.0000 | ORAL_TABLET | ORAL | Status: DC | PRN
  Administered 2021-06-28 (×2): 2 via ORAL
  Administered 2021-06-29: 1 via ORAL
  Filled 2021-06-26: qty 2
  Filled 2021-06-26: qty 1
  Filled 2021-06-26: qty 2

## 2021-06-26 MED ORDER — GABAPENTIN 300 MG PO CAPS
300.0000 mg | ORAL_CAPSULE | Freq: Two times a day (BID) | ORAL | Status: DC
  Administered 2021-06-27 – 2021-06-29 (×5): 300 mg via ORAL
  Filled 2021-06-26 (×5): qty 1

## 2021-06-26 MED ORDER — ACETAMINOPHEN 325 MG PO TABS: 650.0000 mg | ORAL_TABLET | Freq: Four times a day (QID) | ORAL | Status: DC | PRN

## 2021-06-26 MED ORDER — RIVAROXABAN 20 MG PO TABS: 20.0000 mg | ORAL_TABLET | Freq: Every day | ORAL | Status: DC

## 2021-06-26 MED ORDER — PANTOPRAZOLE SODIUM 40 MG PO TBEC
40.0000 mg | DELAYED_RELEASE_TABLET | Freq: Every day | ORAL | Status: DC
Start: 2021-06-26 — End: 2021-07-03
  Administered 2021-06-26 – 2021-07-03 (×8): 40 mg via ORAL
  Filled 2021-06-26 (×8): qty 1

## 2021-06-26 MED ORDER — DOFETILIDE 125 MCG PO CAPS
125.0000 ug | ORAL_CAPSULE | Freq: Two times a day (BID) | ORAL | Status: DC
  Administered 2021-06-26 – 2021-07-02 (×14): 125 ug via ORAL
  Filled 2021-06-26 (×15): qty 1

## 2021-06-26 MED ORDER — VANCOMYCIN HCL 1750 MG/350ML IV SOLN: 1750.0000 mg | INTRAVENOUS | Status: DC

## 2021-06-26 NOTE — Assessment & Plan Note (Deleted)
Suspect sepsis: Tachycardia and tachypnea and source of infection without fever or leukocytosis.  AKI, hypoxia and generalized weakness ?IV hydration per sepsis ?Treated pneumonia as above ?Continue to monitor ?

## 2021-06-26 NOTE — Progress Notes (Signed)
Pharmacy Antibiotic Note ? ?Thomas Samuel is a 86 y.o. male admitted on 06/25/2021 with pneumonia.  Pharmacy has been consulted for Vanc, cefepime  dosing. ? ?Plan: ?Cefepime 2 gm IV X 1 given in ED on 3/15 @ 2354. ?Cefepime 2 gm IV Q12H ordered to continue on 3/16 @ 1200.  ? ?Vancomycin 1750 mg IV X 1 loading dose ordered for 3/16 @ ~ 0100. ?Vancomycin 1750 mg IV Q48H ordered to continue on 3/18 @ ~ 0100.  ? ?AUC = 494.5 ?Vanc trough = 9.1  ? ?Height: 5\' 7"  (170.2 cm) ?Weight: 75.8 kg (167 lb) ?IBW/kg (Calculated) : 66.1 ? ?Temp (24hrs), Avg:98.3 ?F (36.8 ?C), Min:98 ?F (36.7 ?C), Max:98.6 ?F (37 ?C) ? ?Recent Labs  ?Lab 06/25/21 ?1854  ?WBC 4.8  ?CREATININE 1.55*  ?  ?Estimated Creatinine Clearance: 33.8 mL/min (A) (by C-G formula based on SCr of 1.55 mg/dL (H)).   ? ?Allergies  ?Allergen Reactions  ? Penicillins Swelling and Rash  ?  Break outs swelling ?Other reaction(s): SWELLING in 1955  ? Apixaban Other (See Comments)  ?  Cold intolerance ?Cold intolerance ?  ? Prednisone Rash and Other (See Comments)  ?  Makes mean  ?Significant mood and behavioral changes - 'felt like I wasn't in control of anything'  ? ? ?Antimicrobials this admission: ?  >>  ?  >>  ? ?Dose adjustments this admission: ? ? ?Microbiology results: ? BCx:  ? UCx:   ? Sputum:   ? MRSA PCR:  ? ?Thank you for allowing pharmacy to be a part of this patient?s care. ? ?Meko Bellanger D ?06/26/2021 1:07 AM ? ?

## 2021-06-26 NOTE — Evaluation (Signed)
Clinical/Bedside Swallow Evaluation ?Patient Details  ?Name: Samuel Thomas ?MRN: [id number] ?Date of Birth: March 30, 1936 ? ?Today's Date: 06/26/2021 ?Time: SLP Start Time (ACUTE ONLY): 1245 SLP Stop Time (ACUTE ONLY): 1345 ?SLP Time Calculation (min) (ACUTE ONLY): 60 min ? ?Past Medical History:  ?Past Medical History:  ?Diagnosis Date  ? COPD (chronic obstructive pulmonary disease) (HCC)   ? DM2 (diabetes mellitus, type 2) (HCC)   ? HLD (hyperlipidemia)   ? HTN (hypertension)   ? ?Past Surgical History:  ?Past Surgical History:  ?Procedure Laterality Date  ? HERNIA REPAIR    ? ?HPI:  ?Pt is a 86 y.o. male from an area Living Facility with an extensive medical history including ETOH use, Hyperglycemia due to type 2 diabetes mellitus, DM, anemia, astolic heart failure, paroxysmal A. fib on Xarelto and Tikosyn, thyroid nodules, iron deficiency anemia, frequent falls, COPD not on home oxygen, chronic opioid use, hospitalized for pneumonia in October and November 2022, strep antigen positive in November, who presents to the ED with a 3 to 4-day history of generalized weakness and shortness of breath.  He has had no chest pain, fever or chills.  Grandson contributes the history.  He states that he sees his grandfather every week and notes that for the past few days he has been very lethargic and not as interactive as usual and seems to be a bit confused.  He also states that he has had problems with aspiration in the past but does not like the taste of thickened liquids.     ?Head CT at previous admit: "atrophy, chronic microvascular ischemic white matter  disease and bilateral punctate thalamic lacunar infarcts.". Unsure of pt's Baseline Cognitive status.   ?CT of Chest: "Patchy areas of interstitial and ground-glass  infiltration in both lungs most likely represent multifocal  pneumonia. Asymmetrical edema would be a less likely consideration.  There are small bilateral pleural effusions with basilar  atelectasis.".  ? ?   ?Assessment / Plan / Recommendation  ?Clinical Impression ? Pt seen for BSE this afternoon. POA arrived toward end of assessment. Pt was awake, sitting in bed hocking and spitting phlegm intermittently into tissues. Pt was tangential w/ his "stories" he told about his past including "drinking 3 cold beers at a bar during lunch break at work", and making ETOH. He was not oriented to time and events; oriented to name and place("hospital"). Unsure of pt's Baseline Cognitive status.  ? ?Pt appears to present w/ h/o pharyngeal phase dysphagia per previous MBSSs (Nov 2022, Jan 2023) and his/family reports of difficulty swallowing liquids at home "for ~1+ years".  ?Pt consumed po trials during this session today using aspiration precautions and strategies including SINGLE SIPS VIA CUP w/ trials of thin liquids. Though no overt, clinical s/s of aspiration noted during the po trials during assessment, suspect pt's risk for aspiration w/ thin liquids is present, especially when not following precautions/strategies. Suspect pt benefits from SINGLE SIPS via Cup when drinking thin liquids. ? ?During po trials, pt consumed thin liquids via Cup(SINGLE SIPS slowly), ice chips, puree, and soft solids w/ no overt coughing, decline in vocal quality, or change in respiratory presentation during/post trials. Oral phase appeared grossly [facility] w/ timely bolus management, mastication, and control of bolus propulsion for A-P transfer for swallowing. Oral clearing achieved w/ all trial consistencies. OM Exam appeared [facility] w/ no unilateral weakness noted. Speech Clear. Pt fed self w/ setup support. Pt tended to Talk during po trials; education given on less talking  until mouth is clear -- pt followed instructions w/ cues.  ? ?Due to pt's presentation today at this evaluation and his overall request (w/ POA support) of Not drinking thickened liquids "again", recommend continue current Regular diet w/ well-Cut meats, moistened foods; Thin liquids  VIA CUP - SINGLE SMALL SIPS slowly. Recommend aspiration precautions; reduce distractions during meals; verbal cues as needed for follow through w/ precautions. Pills WHOLE vs Crushed in Puree for safer, easier swallowing -- NOT w/ liquids ever again. Education given on Pills in Puree; food consistencies and easy to eat options; aspiration precautions; f/u w/ Palliative Care for establishment of known GOC moving forward in setting of pharyngeal phase dysphagia, risk for aspiration w/ thin liquids, and pt's wishes to "not drink those thickened liquids".  ?Per discussion w/ pt and POA(grandson) in room, both POA and pt know that pt has a history of pharyngeal phase dysphagia w/ risk for aspiration of thin liquids, and both state that pt does NOT want to drink thickened liquids as has been recommended previously in setting of Pulmonary decline/pneumonia and/or if clinical s/s of aspiration currently. Pt stated he wanted to drink "water", and POA agrees. NSG/MD and POA updated on above. ST services will sign off at this time w/ education completed.  ?SLP Visit Diagnosis: Dysphagia, pharyngeal phase (R13.13) (Baseline history of per MBSSs) ?   ?Aspiration Risk ? Mild aspiration risk;Moderate aspiration risk;Risk for inadequate nutrition/hydration  ?  ?Diet Recommendation   Regular diet w/ well-Cut meats, moistened foods; Thin liquids VIA CUP - SINGLE SMALL SIPS slowly. Recommend aspiration precautions; reduce distractions during meals; verbal cues as needed for follow through w/ precautions.  ? ?Medication Administration: Whole meds with puree  ?  ?Other  Recommendations Recommended Consults:  (Palliative Care f/u for GOC, education.) ?Oral Care Recommendations: Oral care BID;Oral care before and after PO;Patient independent with oral care (support) ?Other Recommendations:  (n/a)   ? ?Recommendations for follow up therapy are one component of a multi-disciplinary discharge planning process, led by the attending  physician.  Recommendations may be updated based on patient status, additional functional criteria and insurance authorization. ? ?Follow up Recommendations No SLP follow up  ? ? ?  ?Assistance Recommended at Discharge Set up Supervision/Assistance  ?Functional Status Assessment Patient has not had a recent decline in their functional status  ?Frequency and Duration  (n/a)  ? (n/a) ?  ?   ? ?Prognosis Prognosis for Safe Diet Advancement: Fair ?Barriers to Reach Goals: Cognitive deficits;Time post onset;Severity of deficits;Behavior ?Barriers/Prognosis Comment: unsure of pt's Baseline Cognitive status  ? ?  ? ?Swallow Study   ?General Date of Onset: 06/25/21 ?HPI: Pt is a 86 y.o. male from an area Living Facility with an extensive medical history including ETOH use, Hyperglycemia due to type 2 diabetes mellitus, DM, anemia, astolic heart failure, paroxysmal A. fib on Xarelto and Tikosyn, thyroid nodules, iron deficiency anemia, frequent falls, COPD not on home oxygen, chronic opioid use, hospitalized for pneumonia in October and November 2022, strep antigen positive in November, who presents to the ED with a 3 to 4-day history of generalized weakness and shortness of breath.  He has had no chest pain, fever or chills.  Grandson contributes the history.  He states that he sees his grandfather every week and notes that for the past few days he has been very lethargic and not as interactive as usual and seems to be a bit confused.  He also states that he has had problems with  aspiration in the past but does not like the taste of thickened liquids.    Head CT at previous admit: "atrophy, chronic microvascular ischemic white matter  disease and bilateral punctate thalamic lacunar infarcts.".  CT of Chest: "Patchy areas of interstitial and ground-glass  infiltration in both lungs most likely represent multifocal  pneumonia. Asymmetrical edema would be a less likely consideration.  There are small bilateral pleural  effusions with basilar  atelectasis.". ?Type of Study: Bedside Swallow Evaluation ?Previous Swallow Assessment: MBSSs: Nov 2022; Jan 2023 ?Diet Prior to this Study: Regular;Thin liquids (per pt and POA present) ?[place]

## 2021-06-26 NOTE — [place] FL2 (Signed)
?[place] MEDICAID FL2 LEVEL OF CARE SCREENING TOOL  ?  ? ?IDENTIFICATION  ?Patient Name: ?Samuel Thomas Birthdate: 12/16/1935 Sex: male Admission Date (Current Location): ?06/25/2021  ?[place] ?  Facility and Address:  ?[facility], [address], [place], [place] [address] ?     Provider Number: ?[id number]  ?Attending Physician Name and Address:  ?Osvaldo ShipperKrishnan, Gokul, MD ? Relative Name and Phone Number:  ?  ?   ?Current Level of Care: ?Hospital Recommended Level of Care: ?Skilled Nursing Facility Prior Approval Number: ?  ? ?Date Approved/Denied: ?  PASRR Number: ?  ? ?Discharge Plan: ?SNF ?  ? ?Current Diagnoses: ?Patient Active Problem List  ? Diagnosis Date Noted  ? Paroxysmal atrial fibrillation (HCC) 06/25/2021  ? Chronic disease anemia 06/25/2021  ? Thrombocytopenia (HCC) 06/25/2021  ? Hyperglycemia due to type 2 diabetes mellitus (HCC) 02/20/2021  ? Acute on chronic anemia 02/20/2021  ? Acute respiratory failure with hypoxia (HCC) 02/20/2021  ? Acute metabolic encephalopathy 02/20/2021  ? Severe sepsis (HCC) 02/20/2021  ? Multifocal pneumonia   ? Fall 02/06/2021  ? Generalized weakness 02/06/2021  ? Tobacco abuse 02/06/2021  ? Pain due to onychomycosis of toenails of both feet 05/20/2020  ? History of gout 04/02/2020  ? Acute encephalopathy 10/27/2017  ? Syncope and collapse 10/27/2017  ? Incomplete emptying of bladder 07/20/2017  ? Overactive detrusor 07/20/2017  ? Paroxysmal atrial fibrillation with RVR (HCC) 07/01/2017  ? Hearing loss, sensorineural, asymmetrical 03/09/2017  ? At risk for falls 03/24/2016  ? Polypharmacy 03/24/2016  ? Benign fibroma of prostate 09/10/2015  ? Diastolic heart failure (HCC) 08/16/2015  ? Pulmonary hypertension (HCC) 08/16/2015  ? Cough 08/15/2015  ? Breath shortness 08/15/2015  ? BP (high blood pressure) 07/24/2015  ? BPH with obstruction/lower urinary tract symptoms 07/02/2015  ? Diverticulitis large intestine  01/03/2015  ? AKI (acute kidney injury) (HCC) 01/02/2015  ? N&V (nausea and vomiting) 01/02/2015  ? Chronic pain associated with significant psychosocial dysfunction 10/04/2014  ? Chronic pain syndrome 10/04/2014  ? Encounter for monitoring opioid maintenance therapy 08/29/2014  ? Arthritis 07/24/2014  ? Venous stasis ulcer of lower extremity (HCC) 07/24/2014  ? Chronic pain of right knee 07/03/2014  ? Diabetes mellitus (HCC) 05/17/2014  ? Obstructive apnea 04/25/2014  ? Drug-induced constipation 04/03/2014  ? Arthritis of knee, degenerative 01/30/2014  ? Bacterial skin infection of leg 01/16/2014  ? Chronic diastolic heart failure (HCC) 01/16/2014  ? Edema leg 01/16/2014  ? Cellulitis of lower extremity 01/16/2014  ? Acute diastolic heart failure (HCC) 12/02/2013  ? NSVT (nonsustained ventricular tachycardia) 12/01/2013  ? Altered mental status 12/01/2013  ? Hypokalemia 12/01/2013  ? DM2 (diabetes mellitus, type 2) (HCC) 12/01/2013  ? HTN (hypertension) 12/01/2013  ? Hypoxia 12/01/2013  ? Chronic, continuous use of opioids 09/18/2013  ? Pain in shoulder 07/21/2013  ? Pain of right hand 05/24/2013  ? Bilateral hand pain 05/24/2013  ? Chronic anticoagulation 01/03/2013  ? High risk medication use 01/03/2013  ? Muscle ache 11/23/2012  ? Suicidal ideation 11/23/2012  ? Cellulitis 09/25/2012  ? COPD (chronic obstructive pulmonary disease) (HCC) 09/25/2012  ? Apnea, sleep 09/25/2012  ? Diabetes mellitus type 2 in obese (HCC) 08/15/2012  ? Hook nail 08/15/2012  ? Fungal infection of nail 08/15/2012  ? Arthropathy of lumbar facet joint 07/08/2012  ? Nerve root pain 07/07/2012  ? Cannabis abuse 07/07/2012  ? Marijuana use 07/07/2012  ? Status post cataract extraction 06/16/2012  ?  Artificial lens present 06/16/2012  ? Other specified health status 05/10/2012  ? Depression 05/10/2012  ? Alcohol use 05/10/2012  ? Lumbar canal stenosis 03/17/2012  ? Degenerative arthritis of hip 03/17/2012  ? Adiposity 03/17/2012  ? H/O  arthrodesis 03/17/2012  ? S/P cervical spinal fusion 03/17/2012  ? Arthralgia of hip or thigh 01/12/2012  ? Lumbosacral spondylosis 12/10/2011  ? Pain in thoracic spine 12/10/2011  ? Brachial neuritis 10/27/2011  ? Hypercholesteremia 08/25/2011  ? Hypercholesterolemia 08/25/2011  ? Cervical spinal stenosis 07/27/2011  ? Cervical spondylosis with myelopathy 07/17/2011  ? Difficulty walking 07/13/2011  ? Thoracic and lumbosacral neuritis 07/13/2011  ? Diverticulosis of colon 05/25/2011  ? History of colonic polyps 05/25/2011  ? Diverticular disease of large intestine 05/25/2011  ? Astigmatism 04/10/2011  ? Cataract, nuclear sclerotic senile 04/10/2011  ? Diabetes mellitus type 2, uncontrolled 04/10/2011  ? Bilateral low back pain without sciatica 03/31/2011  ? Allergic rhinitis 06/20/2002  ? Asthma 06/20/2002  ? Moderate persistent asthma without complication 06/20/2002  ? Coronary atherosclerosis 06/17/2000  ? Essential hypertension 06/17/2000  ? ? ?Orientation RESPIRATION BLADDER Height & Weight   ?  ?Self, Situation, Place ? O2 ([place] 2L) Incontinent Weight: 167 lb (75.8 kg) ?Height:  5\' 7"  (170.2 cm)  ?BEHAVIORAL SYMPTOMS/MOOD NEUROLOGICAL BOWEL NUTRITION STATUS  ?    Continent Diet (heart healthy/carb modified, thin liquids)  ?AMBULATORY STATUS COMMUNICATION OF NEEDS Skin   ?Limited Assist Verbally Normal ?  ?  ?  ?    ?     ?     ? ? ?Personal Care Assistance Level of Assistance  ?Dressing, Feeding, Bathing Bathing Assistance: Limited assistance ?Feeding assistance: Independent ?Dressing Assistance: Limited assistance ?   ? ?Functional Limitations Info  ?Sight, Hearing, Speech Sight Info: Adequate ?Hearing Info: Adequate ?Speech Info: Adequate  ? ? ?SPECIAL CARE FACTORS FREQUENCY  ?PT (By licensed PT), OT (By licensed OT)   ?  ?PT Frequency: 5x ?OT Frequency: 5x ?  ?  ?  ?   ? ? ?Contractures Contractures Info: Not present  ? ? ?Additional Factors Info  ?Allergies, Code Status Code Status Info: DNR ?Allergies Info:  Penicillins, Apixaban, Prednisone ?  ?  ?  ?   ? ?Current Medications (06/26/2021):  This is the current hospital active medication list ?Current Facility-Administered Medications  ?Medication Dose Route Frequency Provider Last Rate Last Admin  ? 0.9 %  sodium chloride infusion   Intravenous Continuous 06/28/2021, MD 75 mL/hr at 06/26/21 1225 Rate Change at 06/26/21 1225  ? acetaminophen (TYLENOL) tablet 650 mg  650 mg Oral Q6H PRN 06/28/21, MD      ? Or  ? acetaminophen (TYLENOL) suppository 650 mg  650 mg Rectal Q6H PRN Andris Baumann, MD      ? albuterol (PROVENTIL) (2.5 MG/3ML) 0.083% nebulizer solution 2.5 mg  2.5 mg Nebulization Q2H PRN Andris Baumann, MD      ? atorvastatin (LIPITOR) tablet 20 mg  20 mg Oral QHS Andris Baumann, MD      ? budesonide (PULMICORT) nebulizer solution 0.5 mg  0.5 mg Inhalation Daily Osvaldo Shipper, MD      ? ceFEPIme (MAXIPIME) 2 g in sodium chloride 0.9 % 100 mL IVPB  2 g Intravenous Q12H Osvaldo Shipper V, MD 200 mL/hr at 06/26/21 1237 2 g at 06/26/21 1237  ? [START ON 06/27/2021] diltiazem (CARDIZEM CD) 24 hr capsule 120 mg  120 mg Oral Daily 06/29/2021, MD      ?  dofetilide (TIKOSYN) capsule 125 mcg  125 mcg Oral BID Osvaldo Shipper, MD   125 mcg at 06/26/21 1226  ? finasteride (PROSCAR) tablet 5 mg  5 mg Oral Daily Osvaldo Shipper, MD   5 mg at 06/26/21 1226  ? [START ON 06/27/2021] gabapentin (NEURONTIN) capsule 300 mg  300 mg Oral BID Osvaldo Shipper, MD      ? HYDROcodone-acetaminophen (NORCO/VICODIN) 5-325 MG per tablet 1-2 tablet  1-2 tablet Oral Q4H PRN Andris Baumann, MD      ? insulin aspart (novoLOG) injection 0-15 Units  0-15 Units Subcutaneous TID WC Andris Baumann, MD   8 Units at 06/26/21 1220  ? insulin aspart (novoLOG) injection 0-5 Units  0-5 Units Subcutaneous QHS Andris Baumann, MD      ? loratadine (CLARITIN) tablet 10 mg  10 mg Oral Daily Osvaldo Shipper, MD   10 mg at 06/26/21 1226  ? ondansetron (ZOFRAN) tablet 4 mg  4 mg Oral Q6H PRN  Andris Baumann, MD      ? Or  ? ondansetron [facility]) injection 4 mg  4 mg Intravenous Q6H PRN Andris Baumann, MD      ? pantoprazole (PROTONIX) EC tablet 40 mg  40 mg Oral Daily Osvaldo Shipper, MD   40 mg at 03/1

## 2021-06-26 NOTE — Progress Notes (Signed)
? ?[facility] ?PROGRESS NOTE ? ? DEMETRES PROCHNOW LFY:[id number] DOB: 01-Aug-1935 DOA: 06/25/2021  1 ?DOS: the patient was seen and examined on 06/26/2021 ? ?PCP: Jenell Milliner, MD ? ?Brief History and Hospital Course:  ?86 y.o. male with medical history significant for  DM, HTN, diastolic heart failure, paroxysmal A. fib on Xarelto and Tikosyn, thyroid nodules, iron deficiency anemia, frequent falls, COPD not currently on home oxygen, chronic opioid use, hospitalized for pneumonia in October and November 2022, strep antigen positive in November, who presented to the ED with a 3 to 4-day history of generalized weakness and shortness of breath.  Patient was noted to be tachycardic on arrival to the emergency department.  CT angiogram of the chest raise concern for multifocal pneumonia.  COVID and influenza PCR's were negative.  Patient was hospitalized for further management.   ? ?Consultants: None ? ?Procedures: None ? ? ? ?Subjective: ?Patient pleasantly confused.  Denies any chest pain shortness of breath this morning.  No nausea vomiting. ? ? ? ?Assessment/Plan: ? ? ?* Multifocal pneumonia ?Sepsis present on admission ? ?CT angiogram chest showed multifocal pneumonia.  Patient with previous history of pneumonia. Tachycardia and tachypnea and source of infection without fever or leukocytosis. ?Previously seen by speech therapy and was placed on dysphagia diet but patient apparently does not like the thickened liquids.  Speech therapy consulted again. ?Patient placed on vancomycin and cefepime.  Follow-up blood cultures..  3 L of oxygen was applied.  Saturations in the mid to late 90s.  Wean down as tolerated to maintain saturations greater than 90%.  WBC was noted to be normal yesterday.  Recheck labs tomorrow. ? ?Acute respiratory failure with hypoxia (HCC) ?Does not use oxygen at baseline.  Was placed on oxygen at the skilled nursing facility.  Continue to wean down as tolerated. ? ?Thrombocytopenia  (HCC) ?128,000, Down from over 300,000 about 4 months.  Recheck tomorrow.  No evidence of overt bleeding. ? ?Generalized weakness ?Secondary to acute illness.  Physical therapy evaluation.  Already is at a skilled nursing facility. ? ?AKI (acute kidney injury) (HCC) ?Creatinine was noted to be 1.55 on presentation.  Baseline 0.73.  Started on IV fluids.  Monitor urine output.  Recheck labs tomorrow.  Avoid nephrotoxic agents. ? ?Paroxysmal atrial fibrillation with RVR (HCC) ?Known history of atrial fibrillation.  Continue diltiazem and dofetilide.  Continue Xarelto.  Tachycardia at the time of admission most likely due to dehydration/infection.   ? ?Chronic anticoagulation ?Continue Xarelto. ? ?Chronic diastolic heart failure (HCC) ?Hypovolemia noted at admission. Echo from 10/29 with EF 55 to 60% ?Patient not on diuretics or beta-blockers or ACE/ARB ? ?Chronic disease anemia ?Stable.  No evidence of overt bleeding.  Continue to monitor. ? ?Essential hypertension ?Continue diltiazem.  Monitor blood pressures. ? ?COPD (chronic obstructive pulmonary disease) (HCC) ?Stable.  Continue home medications. ? ?DM2 (diabetes mellitus, type 2) (HCC) ?Monitor CBGs.  Continue SSI.  HbA1c 7.1 in October. ? ? ? ?DVT Prophylaxis: On Xarelto ?Code Status: DNR ?Family Communication: No family at bedside ?Disposition Plan: Hopefully back to SNF when improved ? ?Status is: Inpatient ?Remains inpatient appropriate because: Multifocal pneumonia, acute respiratory failure with hypoxia ? ? ? ? ?Medications: Scheduled: ? insulin aspart  0-15 Units Subcutaneous TID WC  ? insulin aspart  0-5 Units Subcutaneous QHS  ? ?Continuous: ? sodium chloride 150 mL/hr at 06/26/21 0200  ? ceFEPime (MAXIPIME) IV    ? [START ON 06/28/2021] vancomycin    ? ?ENI:DPOEUMPNTIRWE **OR** acetaminophen,  albuterol, HYDROcodone-acetaminophen, ondansetron **OR** ondansetron (ZOFRAN) IV ? ?Antibiotics: ?Anti-infectives (From admission, onward)  ? ? Start     Dose/Rate  Route Frequency Ordered Stop  ? 06/28/21 0300  vancomycin (VANCOREADY) IVPB 1750 mg/350 mL       ? 1,750 mg ?175 mL/hr over 120 Minutes Intravenous Every 48 hours 06/26/21 0110    ? 06/26/21 1200  ceFEPIme (MAXIPIME) 2 g in sodium chloride 0.9 % 100 mL IVPB       ? 2 g ?200 mL/hr over 30 Minutes Intravenous Every 12 hours 06/26/21 0102    ? 06/26/21 0300  vancomycin (VANCOREADY) IVPB 1750 mg/350 mL       ? 1,750 mg ?175 mL/hr over 120 Minutes Intravenous  Once 06/26/21 0301 06/26/21 0518  ? 06/26/21 0030  vancomycin (VANCOREADY) IVPB 1750 mg/350 mL  Status:  Discontinued       ? 1,750 mg ?175 mL/hr over 120 Minutes Intravenous  Once 06/26/21 0021 06/26/21 0301  ? 06/26/21 0015  cefTRIAXone (ROCEPHIN) 2 g in sodium chloride 0.9 % 100 mL IVPB  Status:  Discontinued       ? 2 g ?200 mL/hr over 30 Minutes Intravenous Every 24 hours 06/26/21 0009 06/26/21 0016  ? 06/26/21 0015  azithromycin (ZITHROMAX) 500 mg in sodium chloride 0.9 % 250 mL IVPB  Status:  Discontinued       ? 500 mg ?250 mL/hr over 60 Minutes Intravenous Every 24 hours 06/26/21 0009 06/26/21 0016  ? 06/25/21 2330  ceFEPIme (MAXIPIME) 2 g in sodium chloride 0.9 % 100 mL IVPB       ? 2 g ?200 mL/hr over 30 Minutes Intravenous  Once 06/25/21 2315 06/26/21 0100  ? 06/25/21 2330  vancomycin (VANCOCIN) IVPB 1000 mg/200 mL premix  Status:  Discontinued       ? 1,000 mg ?200 mL/hr over 60 Minutes Intravenous  Once 06/25/21 2315 06/26/21 0021  ? ?  ? ? ?Objective: ? ?Vital Signs ? ?Vitals:  ? 06/25/21 2141 06/25/21 2230 06/26/21 0053 06/26/21 0851  ?BP:  128/63 114/78 (!) 108/54  ?Pulse:  93 (!) 102 74  ?Resp:  (!) 26 20 20   ?Temp:   98 ?F (36.7 ?C) 98.2 ?F (36.8 ?C)  ?TempSrc:    Oral  ?SpO2: 97% 97% 96% 98%  ?Weight:      ?Height:      ? ? ?Intake/Output Summary (Last 24 hours) at 06/26/2021 0946 ?Last data filed at 06/26/2021 0600 ?Gross per 24 hour  ?Intake 1450 ml  ?Output --  ?Net 1450 ml  ? ?Filed Weights  ? 06/25/21 1852  ?Weight: 75.8 kg  ? ? ?General  appearance: Awake alert.  In no distress ?Resp: Coarse breath sound bilaterally.  Mildly tachypneic.  Few crackles at the bases bilaterally.  No wheezing or rhonchi. ?Cardio: S1-S2 is normal regular.  No S3-S4.  No rubs murmurs or bruit ?GI: Abdomen is soft.  Nontender nondistended.  Bowel sounds are present normal.  No masses organomegaly ?Extremities: No edema.  Physical deconditioning is noted. ?Neurologic:  No focal neurological deficits.  ? ? ?Lab Results: ? ?Data Reviewed: I have personally reviewed labs and imaging study reports ? ?CBC: ?Recent Labs  ?Lab 06/25/21 ?1854  ?WBC 4.8  ?NEUTROABS 3.8  ?HGB 10.1*  ?HCT 32.7*  ?MCV 88.4  ?PLT 128*  ? ? ?Basic Metabolic Panel: ?Recent Labs  ?Lab 06/25/21 ?1854  ?NA 136  ?K 4.1  ?CL 98  ?CO2 28  ?GLUCOSE 136*  ?BUN  29*  ?CREATININE 1.55*  ?CALCIUM 8.7*  ? ? ?GFR: ?Estimated Creatinine Clearance: 33.8 mL/min (A) (by C-G formula based on SCr of 1.55 mg/dL (H)). ? ?Liver Function Tests: ?Recent Labs  ?Lab 06/25/21 ?1854  ?AST 22  ?ALT 15  ?ALKPHOS 60  ?BILITOT 0.3  ?PROT 6.2*  ?ALBUMIN 2.1*  ? ? ? ?CBG: ?Recent Labs  ?Lab 06/26/21 ?0539 06/26/21 ?7673  ?GLUCAP 152* 134*  ? ? ? ?Recent Results (from the past 240 hour(s))  ?Resp Panel by RT-PCR (Flu A&B, Covid) Nasopharyngeal Swab     Status: None  ? Collection Time: 06/25/21  9:55 PM  ? Specimen: Nasopharyngeal Swab; Nasopharyngeal(NP) swabs in vial transport medium  ?Result Value Ref Range Status  ? SARS Coronavirus 2 by RT PCR NEGATIVE NEGATIVE Final  ?  Comment: (NOTE) ?SARS-CoV-2 target nucleic acids are NOT DETECTED. ? ?The SARS-CoV-2 RNA is generally detectable in upper respiratory ?specimens during the acute phase of infection. The lowest ?concentration of SARS-CoV-2 viral copies this assay can detect is ?138 copies/mL. A negative result does not preclude SARS-Cov-2 ?infection and should not be used as the sole basis for treatment or ?other patient management decisions. A negative result may occur with  ?improper  specimen collection/handling, submission of specimen other ?than nasopharyngeal swab, presence of viral mutation(s) within the ?areas targeted by this assay, and inadequate number of viral ?copies(<138 copies/mL).

## 2021-06-26 NOTE — Hospital Course (Addendum)
86 y.o. male with medical history significant for  DM, HTN, diastolic heart failure, paroxysmal A. fib on Xarelto and Tikosyn, thyroid nodules, iron deficiency anemia, frequent falls, COPD not currently on home oxygen, chronic opioid use, hospitalized for pneumonia in October and November 2022, strep antigen positive in November, who presented to the ED with a 3 to 4-day history of generalized weakness and shortness of breath.  Patient was noted to be tachycardic on arrival to the emergency department.  CT angiogram of the chest raise concern for multifocal pneumonia.  COVID and influenza PCR's were negative.  Patient was hospitalized for further management.  Respiratory status has improved.  Cardizem dose had to be increased and metoprolol was added.  Heart rate seems to be better controlled now.  Has been somewhat lethargic and more confused than usual the last 48 hours. ?

## 2021-06-26 NOTE — Assessment & Plan Note (Addendum)
Does not use oxygen at baseline.  Was placed on oxygen at the skilled nursing facility.  Has been on and off of oxygen here in the hospital.  Will need to assess home oxygen requirements closer to discharge. ?

## 2021-06-27 DIAGNOSIS — I480 Paroxysmal atrial fibrillation: Secondary | ICD-10-CM | POA: Diagnosis not present

## 2021-06-27 DIAGNOSIS — J189 Pneumonia, unspecified organism: Secondary | ICD-10-CM | POA: Diagnosis not present

## 2021-06-27 DIAGNOSIS — N179 Acute kidney failure, unspecified: Secondary | ICD-10-CM | POA: Diagnosis not present

## 2021-06-27 DIAGNOSIS — J9601 Acute respiratory failure with hypoxia: Secondary | ICD-10-CM | POA: Diagnosis not present

## 2021-06-27 DIAGNOSIS — R000 Tachycardia, unspecified: Secondary | ICD-10-CM

## 2021-06-27 LAB — CBC
HCT: 30.5 % — ABNORMAL LOW (ref 39.0–52.0)
Hemoglobin: 9.5 g/dL — ABNORMAL LOW (ref 13.0–17.0)
MCH: 27.5 pg (ref 26.0–34.0)
MCHC: 31.1 g/dL (ref 30.0–36.0)
MCV: 88.2 fL (ref 80.0–100.0)
Platelets: 125 10*3/uL — ABNORMAL LOW (ref 150–400)
RBC: 3.46 MIL/uL — ABNORMAL LOW (ref 4.22–5.81)
RDW: 13.2 % (ref 11.5–15.5)
WBC: 3.5 10*3/uL — ABNORMAL LOW (ref 4.0–10.5)
nRBC: 0 % (ref 0.0–0.2)

## 2021-06-27 LAB — BASIC METABOLIC PANEL
Anion gap: 7 (ref 5–15)
BUN: 24 mg/dL — ABNORMAL HIGH (ref 8–23)
CO2: 27 mmol/L (ref 22–32)
Calcium: 8.3 mg/dL — ABNORMAL LOW (ref 8.9–10.3)
Chloride: 104 mmol/L (ref 98–111)
Creatinine, Ser: 1.25 mg/dL — ABNORMAL HIGH (ref 0.61–1.24)
GFR, Estimated: 57 mL/min — ABNORMAL LOW (ref >60)
Glucose, Bld: 197 mg/dL — ABNORMAL HIGH (ref 70–99)
Potassium: 3.6 mmol/L (ref 3.5–5.1)
Sodium: 138 mmol/L (ref 135–145)

## 2021-06-27 LAB — MAGNESIUM: Magnesium: 1.8 mg/dL (ref 1.7–2.4)

## 2021-06-27 LAB — GLUCOSE, CAPILLARY
Glucose-Capillary: 167 mg/dL — ABNORMAL HIGH (ref 70–99)
Glucose-Capillary: 197 mg/dL — ABNORMAL HIGH (ref 70–99)
Glucose-Capillary: 206 mg/dL — ABNORMAL HIGH (ref 70–99)
Glucose-Capillary: 247 mg/dL — ABNORMAL HIGH (ref 70–99)

## 2021-06-27 MED ORDER — DILTIAZEM HCL ER COATED BEADS 240 MG PO CP24
240.0000 mg | ORAL_CAPSULE | Freq: Every day | ORAL | Status: DC
  Administered 2021-06-27: 120 mg via ORAL
  Administered 2021-06-28: 240 mg via ORAL
  Filled 2021-06-27: qty 1

## 2021-06-27 MED ORDER — MAGNESIUM SULFATE 2 GM/50ML IV SOLN
2.0000 g | Freq: Once | INTRAVENOUS | Status: AC
  Administered 2021-06-27: 2 g via INTRAVENOUS
  Filled 2021-06-27: qty 50

## 2021-06-27 MED ORDER — SODIUM CHLORIDE 0.9 % IV SOLN: INTRAVENOUS | Status: DC

## 2021-06-27 MED ORDER — DILTIAZEM HCL ER COATED BEADS 120 MG PO CP24
120.0000 mg | ORAL_CAPSULE | Freq: Once | ORAL | Status: AC
  Filled 2021-06-27 (×2): qty 1

## 2021-06-27 MED ORDER — POTASSIUM CHLORIDE CRYS ER 20 MEQ PO TBCR
40.0000 meq | EXTENDED_RELEASE_TABLET | Freq: Once | ORAL | Status: AC
  Administered 2021-06-27: 40 meq via ORAL
  Filled 2021-06-27: qty 2

## 2021-06-27 MED ORDER — METOPROLOL TARTRATE 5 MG/5ML IV SOLN
2.5000 mg | Freq: Four times a day (QID) | INTRAVENOUS | Status: DC | PRN
  Administered 2021-06-27 – 2021-06-29 (×2): 2.5 mg via INTRAVENOUS
  Filled 2021-06-27 (×2): qty 5

## 2021-06-27 NOTE — Progress Notes (Addendum)
? ?[facility] ?PROGRESS NOTE ? ? JUMAL HUSSAIN [id number] DOB: 1935-05-10 DOA: 06/25/2021  2 ?DOS: the patient was seen and examined on 06/27/2021 ? ?PCP: Danae Orleans, MD ? ?Brief History and Hospital Course:  ?86 y.o. male with medical history significant for  DM, HTN, diastolic heart failure, paroxysmal A. fib on Xarelto and Tikosyn, thyroid nodules, iron deficiency anemia, frequent falls, COPD not currently on home oxygen, chronic opioid use, hospitalized for pneumonia in October and November 2022, strep antigen positive in November, who presented to the ED with a 3 to 4-day history of generalized weakness and shortness of breath.  Patient was noted to be tachycardic on arrival to the emergency department.  CT angiogram of the chest raise concern for multifocal pneumonia.  COVID and influenza PCR's were negative.  Patient was hospitalized for further management.   ? ?Consultants: None ? ?Procedures: None ? ? ? ?Subjective: ?Patient pleasantly confused.  But answering questions more appropriately today.  Denies any chest pain.  Shortness of breath is improving.  Cough is getting better. ? ? ? ?Assessment/Plan: ? ? ?* Multifocal pneumonia ?Sepsis present on admission ? ?CT angiogram chest showed multifocal pneumonia.  Patient with previous history of pneumonia. Tachycardia and tachypnea and source of infection without fever or leukocytosis. ?Previously seen by speech therapy and was placed on dysphagia diet but patient apparently does not like the thickened liquids.  Speech therapy consulted again. ?Patient placed on vancomycin and cefepime.  Cultures negative so far.  MRSA PCR negative.  We will stop his vancomycin. ?Seems to be stable on 2 to 3 L of oxygen by nasal cannula. ?WBC not elevated.  Mild leukopenia is noted.  Afebrile. ?Considering that he has had recurrent hospitalization for similar issues he will benefit from palliative care evaluation for goals of care.  This can be done at a skilled  nursing facility. ? ?Acute respiratory failure with hypoxia ([place]) ?Does not use oxygen at baseline.  Was placed on oxygen at the skilled nursing facility.  Continue to wean down as tolerated. ? ?Thrombocytopenia ([place]) ?128,000, Down from over 300,000 about 4 months.  Platelet count stable this morning.  No evidence of overt bleeding.  Continue to monitor.   ? ?Generalized weakness ?Secondary to acute illness.  Physical therapy evaluation.  Already is at a skilled nursing facility. ? ?AKI (acute kidney injury) ([employer]) ?Creatinine was noted to be 1.55 on presentation.  Baseline 0.73.  Started on IV fluids.  Renal function gradually improving.  Monitor urine output.  Avoid nephrotoxic agents.  Continue IV fluids for another 24 hours. ? ?Paroxysmal atrial fibrillation with RVR ([place]) ?Known history of atrial fibrillation.  Continue diltiazem and dofetilide.  Continue Xarelto.  Tachycardia at the time of admission most likely due to dehydration/infection.   ? ?Noted to be in RVR this morning.  He was placed on a lower dose of his Cardizem due to borderline low blood pressures.  We will give him additional dose of Cardizem today to make it a total of 240 mg.  Continue Tikosyn.  Replace potassium and magnesium.  Monitor on telemetry.  Patient is asymptomatic.  Can use metoprolol as needed. ? ?Chronic anticoagulation ?Continue Xarelto. ? ?Chronic diastolic heart failure ([place]) ?Hypovolemia noted at admission. Echo from 10/29 with EF 55 to 60% ?Patient not on diuretics or beta-blockers or ACE/ARB ?Gentle hydration for another 24 hours. ? ?Chronic disease anemia ?Stable.  No evidence of overt bleeding.  Continue to monitor. ? ?Essential hypertension ?Continue diltiazem.  Monitor blood pressures. ? ?COPD (chronic obstructive pulmonary disease) ([place]) ?Stable.  Continue home medications. ? ?DM2 (diabetes mellitus, type 2) ([place]) ?Monitor CBGs.  Continue SSI.  HbA1c 7.1 in October. ? ? ? ?DVT Prophylaxis: On Xarelto ?Code Status:  DNR ?Family Communication: No family at bedside.  We will update his grandson later today. ?Disposition Plan: Hopefully back to SNF when improved ? ?Status is: Inpatient ?Remains inpatient appropriate because: Multifocal pneumonia, acute respiratory failure with hypoxia ? ? ? ? ?Medications: Scheduled: ? atorvastatin  20 mg Oral QHS  ? budesonide  0.5 mg Inhalation Daily  ? [START ON 06/28/2021] diltiazem  240 mg Oral Daily  ? dofetilide  125 mcg Oral BID  ? finasteride  5 mg Oral Daily  ? gabapentin  300 mg Oral BID  ? insulin aspart  0-15 Units Subcutaneous TID WC  ? insulin aspart  0-5 Units Subcutaneous QHS  ? loratadine  10 mg Oral Daily  ? pantoprazole  40 mg Oral Daily  ? rivaroxaban  15 mg Oral Q supper  ? tamsulosin  0.4 mg Oral Daily  ? tiotropium  18 mcg Inhalation Daily  ? ?Continuous: ? sodium chloride 50 mL/hr at 06/27/21 0930  ? ceFEPime (MAXIPIME) IV 2 g (06/27/21 [id number])  ? [START ON 06/28/2021] vancomycin    ? ?[id number] **OR** acetaminophen, albuterol, HYDROcodone-acetaminophen, metoprolol tartrate, ondansetron **OR** ondansetron (ZOFRAN) IV, polyethylene glycol ? ?Antibiotics: ?Anti-infectives (From admission, onward)  ? ? Start     Dose/Rate Route Frequency Ordered Stop  ? 06/28/21 0300  vancomycin (VANCOREADY) IVPB 1750 mg/350 mL       ? 1,750 mg ?175 mL/hr over 120 Minutes Intravenous Every 48 hours 06/26/21 0110    ? 06/26/21 1200  ceFEPIme (MAXIPIME) 2 g in sodium chloride 0.9 % 100 mL IVPB       ? 2 g ?200 mL/hr over 30 Minutes Intravenous Every 12 hours 06/26/21 0102    ? 06/26/21 0300  vancomycin (VANCOREADY) IVPB 1750 mg/350 mL       ? 1,750 mg ?175 mL/hr over 120 Minutes Intravenous  Once 06/26/21 0301 06/26/21 0518  ? 06/26/21 0030  vancomycin (VANCOREADY) IVPB 1750 mg/350 mL  Status:  Discontinued       ? 1,750 mg ?175 mL/hr over 120 Minutes Intravenous  Once 06/26/21 0021 06/26/21 0301  ? 06/26/21 0015  cefTRIAXone (ROCEPHIN) 2 g in sodium chloride 0.9 % 100 mL IVPB  Status:   Discontinued       ? 2 g ?200 mL/hr over 30 Minutes Intravenous Every 24 hours 06/26/21 0009 06/26/21 0016  ? 06/26/21 0015  azithromycin (ZITHROMAX) 500 mg in sodium chloride 0.9 % 250 mL IVPB  Status:  Discontinued       ? 500 mg ?250 mL/hr over 60 Minutes Intravenous Every 24 hours 06/26/21 0009 06/26/21 0016  ? 06/25/21 2330  ceFEPIme (MAXIPIME) 2 g in sodium chloride 0.9 % 100 mL IVPB       ? 2 g ?200 mL/hr over 30 Minutes Intravenous  Once 06/25/21 2315 06/26/21 0100  ? 06/25/21 2330  vancomycin (VANCOCIN) IVPB 1000 mg/200 mL premix  Status:  Discontinued       ? 1,000 mg ?200 mL/hr over 60 Minutes Intravenous  Once 06/25/21 2315 06/26/21 0021  ? ?  ? ? ?Objective: ? ?Vital Signs ? ?Vitals:  ? 06/27/21 0030 06/27/21 0600 06/27/21 0742 06/27/21 0944  ?BP: 135/73 (!) 160/72 (!) 144/97 131/86  ?Pulse: (!) 101 98 (!) 128 (!) 118  ?  Resp: 18 20 20 19   ?Temp: 98.7 ?F (37.1 ?C) 98.5 ?F (36.9 ?C) 98.6 ?F (37 ?C) 98.1 ?F (36.7 ?C)  ?TempSrc:      ?SpO2: 97% 99% 100% 98%  ?Weight:      ?Height:      ? ? ?Intake/Output Summary (Last 24 hours) at 06/27/2021 1047 ?Last data filed at 06/27/2021 0600 ?Gross per 24 hour  ?Intake 1878 ml  ?Output 1900 ml  ?Net -22 ml  ? ?Filed Weights  ? 06/25/21 1852  ?Weight: 75.8 kg  ? ? ?General appearance: Awake alert.  In no distress ?Resp: Crackles bilateral bases.  No wheezing or rhonchi.  Normal effort at rest. ?Cardio: S1-S2 is irregularly irregular.  No S3-S4.  Tachycardic. ?GI: Abdomen is soft.  Nontender nondistended.  Bowel sounds are present normal.  No masses organomegaly ?Extremities: No edema.   ?Neurologic: No focal neurological deficits.  ? ? ? ?Lab Results: ? ?Data Reviewed: I have personally reviewed labs and imaging study reports ? ?CBC: ?Recent Labs  ?Lab 06/25/21 ?1854 06/27/21 ?0428  ?WBC 4.8 3.5*  ?NEUTROABS 3.8  --   ?HGB 10.1* 9.5*  ?HCT 32.7* 30.5*  ?MCV 88.4 88.2  ?PLT 128* 125*  ? ? ?Basic Metabolic Panel: ?Recent Labs  ?Lab 06/25/21 ?1854 06/27/21 ?0428  ?NA 136  138  ?K 4.1 3.6  ?CL 98 104  ?CO2 28 27  ?GLUCOSE 136* 197*  ?BUN 29* 24*  ?CREATININE 1.55* 1.25*  ?CALCIUM 8.7* 8.3*  ?MG  --  1.8  ? ? ?GFR: ?Estimated Creatinine Clearance: 41.9 mL/min (A) (by C-G formula

## 2021-06-27 NOTE — Progress Notes (Signed)
?   06/27/21 0742  ?Assess: MEWS Score  ?Temp 98.6 ?F (37 ?C)  ?BP (!) 144/97  ?Pulse Rate (!) 128  ?Resp 20  ?SpO2 100 %  ?O2 Device Nasal Cannula  ?O2 Flow Rate (L/min) 2 L/min  ?Assess: MEWS Score  ?MEWS Temp 0  ?MEWS Systolic 0  ?MEWS Pulse 2  ?MEWS RR 0  ?MEWS LOC 0  ?MEWS Score 2  ?MEWS Score Color Yellow  ?Assess: if the MEWS score is Yellow or Red  ?Were vital signs taken at a resting state? Yes  ?Focused Assessment No change from prior assessment  ?Does the patient meet 2 or more of the SIRS criteria? No  ?Does the patient have a confirmed or suspected source of infection? Yes  ?Provider and Rapid Response Notified? Yes  ?MEWS guidelines implemented *See Row Information* Yes  ?Treat  ?MEWS Interventions Administered prn meds/treatments;Administered scheduled meds/treatments  ?Pain Scale 0-10  ?Pain Score 0  ?Take Vital Signs  ?Increase Vital Sign Frequency  Yellow: Q 2hr X 2 then Q 4hr X 2, if remains yellow, continue Q 4hrs  ?Escalate  ?MEWS: Escalate Yellow: discuss with charge nurse/RN and consider discussing with provider and RRT  ?Notify: Charge Nurse/RN  ?Name of Charge Nurse/RN Notified Estill Bamberg, RN  ?Date Charge Nurse/RN Notified 06/27/21  ?Time Charge Nurse/RN Notified [phone]  ?Notify: Provider  ?Provider Name/Title Dr. Maryland Pink  ?Date Provider Notified 06/27/21  ?Time Provider Notified [phone]  ?Notification Type Face-to-face ?(secure chat)  ?Notification Reason Other (Comment) ?(Elevated HR)  ?Provider response See new orders  ?Date of Provider Response 06/27/21  ?Time of Provider Response [phone]  ?Document  ?Patient Outcome Stabilized after interventions  ?Progress note created (see row info) Yes  ?Assess: SIRS CRITERIA  ?SIRS Temperature  0  ?SIRS Pulse 1  ?SIRS Respirations  0  ?SIRS WBC 0  ?SIRS Score Sum  1  ? ? ?

## 2021-06-27 NOTE — Care Management Important Message (Signed)
Important Message ? ?Patient Details  ?Name: Samuel Thomas ?MRN: [id number] ?Date of Birth: 05-Jul-1935 ? ? ?Medicare Important Message Given:  N/A - LOS <3 / Initial given by admissions ? ? ? ? ?[name] ?06/27/2021, 9:09 AM ?

## 2021-06-28 DIAGNOSIS — I480 Paroxysmal atrial fibrillation: Secondary | ICD-10-CM | POA: Diagnosis not present

## 2021-06-28 DIAGNOSIS — N179 Acute kidney failure, unspecified: Secondary | ICD-10-CM | POA: Diagnosis not present

## 2021-06-28 DIAGNOSIS — J9601 Acute respiratory failure with hypoxia: Secondary | ICD-10-CM | POA: Diagnosis not present

## 2021-06-28 DIAGNOSIS — J189 Pneumonia, unspecified organism: Secondary | ICD-10-CM | POA: Diagnosis not present

## 2021-06-28 LAB — MAGNESIUM: Magnesium: 2.1 mg/dL (ref 1.7–2.4)

## 2021-06-28 LAB — BASIC METABOLIC PANEL
Anion gap: 8 (ref 5–15)
BUN: 16 mg/dL (ref 8–23)
CO2: 25 mmol/L (ref 22–32)
Calcium: 8.3 mg/dL — ABNORMAL LOW (ref 8.9–10.3)
Chloride: 107 mmol/L (ref 98–111)
Creatinine, Ser: 1.23 mg/dL (ref 0.61–1.24)
GFR, Estimated: 58 mL/min — ABNORMAL LOW (ref >60)
Glucose, Bld: 216 mg/dL — ABNORMAL HIGH (ref 70–99)
Potassium: 3.9 mmol/L (ref 3.5–5.1)
Sodium: 140 mmol/L (ref 135–145)

## 2021-06-28 LAB — CBC
HCT: 32.2 % — ABNORMAL LOW (ref 39.0–52.0)
Hemoglobin: 9.8 g/dL — ABNORMAL LOW (ref 13.0–17.0)
MCH: 27.1 pg (ref 26.0–34.0)
MCHC: 30.4 g/dL (ref 30.0–36.0)
MCV: 89.2 fL (ref 80.0–100.0)
Platelets: 144 10*3/uL — ABNORMAL LOW (ref 150–400)
RBC: 3.61 MIL/uL — ABNORMAL LOW (ref 4.22–5.81)
RDW: 13.2 % (ref 11.5–15.5)
WBC: 3.9 10*3/uL — ABNORMAL LOW (ref 4.0–10.5)
nRBC: 0 % (ref 0.0–0.2)

## 2021-06-28 LAB — GLUCOSE, CAPILLARY
Glucose-Capillary: 203 mg/dL — ABNORMAL HIGH (ref 70–99)
Glucose-Capillary: 186 mg/dL — ABNORMAL HIGH (ref 70–99)
Glucose-Capillary: 162 mg/dL — ABNORMAL HIGH (ref 70–99)
Glucose-Capillary: 265 mg/dL — ABNORMAL HIGH (ref 70–99)

## 2021-06-28 MED ORDER — CEFDINIR 300 MG PO CAPS
300.0000 mg | ORAL_CAPSULE | Freq: Two times a day (BID) | ORAL | Status: DC
  Administered 2021-06-28 – 2021-07-02 (×10): 300 mg via ORAL
  Filled 2021-06-28 (×11): qty 1

## 2021-06-28 MED ORDER — POTASSIUM CHLORIDE CRYS ER 20 MEQ PO TBCR
40.0000 meq | EXTENDED_RELEASE_TABLET | Freq: Once | ORAL | Status: AC
Start: 2021-06-28 — End: 2021-06-28
  Administered 2021-06-28: 40 meq via ORAL
  Filled 2021-06-28: qty 2

## 2021-06-28 MED ORDER — DILTIAZEM HCL ER COATED BEADS 300 MG PO CP24
300.0000 mg | ORAL_CAPSULE | Freq: Every day | ORAL | Status: DC
  Administered 2021-06-29 – 2021-07-03 (×5): 300 mg via ORAL
  Filled 2021-06-28 (×5): qty 1

## 2021-06-28 NOTE — Progress Notes (Signed)
?   06/28/21 1955  ?Assess: MEWS Score  ?Temp 98.4 ?F (36.9 ?C)  ?BP 127/65  ?Pulse Rate (!) 118  ?ECG Heart Rate (!) 115  ?Resp 18  ?Level of Consciousness Alert  ?SpO2 (!) 83 %  ?O2 Device Room Air  ?O2 Flow Rate (L/min) 0 L/min  ?Assess: MEWS Score  ?MEWS Temp 0  ?MEWS Systolic 0  ?MEWS Pulse 2  ?MEWS RR 0  ?MEWS LOC 0  ?MEWS Score 2  ?MEWS Score Color Yellow  ?Assess: if the MEWS score is Yellow or Red  ?Were vital signs taken at a resting state? Yes  ?Focused Assessment Change from prior assessment (see assessment flowsheet)  ?Does the patient meet 2 or more of the SIRS criteria? No  ?Does the patient have a confirmed or suspected source of infection? Yes  ?Provider and Rapid Response Notified? Yes  ?MEWS guidelines implemented *See Row Information* Yes  ?Treat  ?MEWS Interventions Administered scheduled meds/treatments  ?Pain Scale 0-10  ?Pain Score 7  ?Pain Type Chronic pain  ?Pain Location Back  ?Pain Orientation Lower  ?Pain Descriptors / Indicators Aching;Sore  ?Pain Frequency Constant  ?Pain Onset On-going  ?Patients Stated Pain Goal 2  ?Pain Intervention(s) Medication (See eMAR)  ?Take Vital Signs  ?Increase Vital Sign Frequency  Yellow: Q 2hr X 2 then Q 4hr X 2, if remains yellow, continue Q 4hrs  ?Escalate  ?MEWS: Escalate Yellow: discuss with charge nurse/RN and consider discussing with provider and RRT  ?Notify: Charge Nurse/RN  ?Name of Charge Nurse/RN Notified Annice Pih RN  ?Date Charge Nurse/RN Notified 06/28/21  ?Time Charge Nurse/RN Notified 2020  ?Notify: Provider  ?Provider Name/Title Wannetta Sender NP  ?Date Provider Notified 06/28/21  ?Time Provider Notified 2021  ?Notification Type Face-to-face  ?Notification Reason Other (Comment) ?(Yellow MEWS)  ?Provider response In department  ?Date of Provider Response 06/28/21  ?Time of Provider Response 2022  ?Document  ?Patient Outcome Stabilized after interventions  ?Progress note created (see row info) Yes  ?Assess: SIRS CRITERIA  ?SIRS Temperature  0   ?SIRS Pulse 1  ?SIRS Respirations  0  ?SIRS WBC 0  ?SIRS Score Sum  1  ? ? ?

## 2021-06-28 NOTE — Progress Notes (Signed)
? ?[facility] ?PROGRESS NOTE ? ? Samuel Thomas [id number] DOB: 12/24/1935 DOA: 06/25/2021  3 ?DOS: the patient was seen and examined on 06/28/2021 ? ?PCP: [name], [name], MD ? ?Brief History and Hospital Course:  ?86 y.o. male with medical history significant for  DM, HTN, diastolic heart failure, paroxysmal A. fib on Xarelto and Tikosyn, thyroid nodules, iron deficiency anemia, frequent falls, COPD not currently on home oxygen, chronic opioid use, hospitalized for pneumonia in October and November 2022, strep antigen positive in November, who presented to the ED with a 3 to 4-day history of generalized weakness and shortness of breath.  Patient was noted to be tachycardic on arrival to the emergency department.  CT angiogram of the chest raise concern for multifocal pneumonia.  COVID and influenza PCR's were negative.  Patient was hospitalized for further management.   ? ?Consultants: None ? ?Procedures: None ? ? ? ?Subjective: ?Complains of back pain this morning.  Denies any chest pain shortness of breath.   ? ? ?Assessment/Plan: ? ? ?* Multifocal pneumonia ?Sepsis present on admission ? ?CT angiogram chest showed multifocal pneumonia.  Patient with previous history of pneumonia. Tachycardia and tachypnea and source of infection without fever or leukocytosis. ?Previously seen by speech therapy and was placed on dysphagia diet but patient apparently does not like the thickened liquids.  Speech therapy consulted again. ?Patient placed on vancomycin and cefepime.  Cultures negative so far.  MRSA PCR negative.  Vancomycin was stopped yesterday.  We will change cefe[facility]ime to [facility] today. ?Remains afebrile.  Leukopenia is stable. ?Remains on 2 L of oxygen.  Check a room air saturations to see if he would need oxygen at discharge. ?Considering that he has had recurrent hospitalization for similar issues he will benefit from palliative care evaluation for goals of care.  This can be done at a skilled nursing  facility. ? ?Acute respiratory failure with hypoxia (HCC) ?Does not use oxygen at baseline.  Was placed on oxygen at the skilled nursing facility.  Room air oxygen saturation assessment to determine need for oxygen at discharge. ? ?Thrombocytopenia (HCC) ?Counts were 128,000, Down from over 300,000 about 4 months.  Platelet counts improving.  Likely due to acute illness. ? ?Generalized weakness ?Secondary to acute illness.  Physical therapy evaluation.  Already is at a skilled nursing facility. ? ?AKI (acute kidney injury) (HCC) ?Creatinine was noted to be 1.55 on presentation.  Baseline 0.73.  Started on IV fluids.  Creatinine has improved and back to baseline.  Stop IV fluids. ? ?Paroxysmal atrial fibrillation with RVR (HCC) ?Known history of atrial fibrillation.  Continue diltiazem and dofetilide.  Continue Xarelto.  Tachycardia at the time of admission most likely due to dehydration/infection.   ?Heart rate was poorly controlled yesterday.  Patient was placed back on his usual dose of diltiazem.  Noted to be tachycardic this morning.  Will reassess after he has been given his a.m. medications.  If he remains tachycardic then will go up on the dose of the diltiazem further. ?Continue Tikosyn.  Give dose of potassium today.  Magnesium is 2.1 today.   ?Continue to monitor on telemetry.  Use metoprolol as needed for tachycardia. ? ?Chronic anticoagulation ?Continue Xarelto. ? ?Chronic diastolic heart failure (HCC) ?Hypovolemia noted at admission. Echo from 10/29 with EF 55 to 60% ?Patient not on diuretics or beta-blockers or ACE/ARB ?Euvolemic now.  Stop IV fluids. ? ?Chronic disease anemia ?Stable.  No evidence of overt bleeding.  Continue to monitor. ? ?Essential hypertension ?  Blood pressure reasonably well controlled. ? ?COPD (chronic obstructive pulmonary disease) (HCC) ?Stable.  Continue home medications. ? ?DM2 (diabetes mellitus, type 2) (HCC) ?Monitor CBGs.  Continue SSI.  HbA1c 7.1 in October. ? ? ? ?DVT  Prophylaxis: On Xarelto ?Code Status: DNR ?Family Communication: Lucila Maine was updated yesterday ?Disposition Plan: Hopefully back to SNF when improved ? ?Status is: Inpatient ?Remains inpatient appropriate because: Multifocal pneumonia, acute respiratory failure with hypoxia ? ? ? ? ?Medications: Scheduled: ? atorvastatin  20 mg Oral QHS  ? budesonide  0.5 mg Inhalation Daily  ? cefdinir  300 mg Oral Q12H  ? diltiazem  240 mg Oral Daily  ? dofetilide  125 mcg Oral BID  ? finasteride  5 mg Oral Daily  ? gabapentin  300 mg Oral BID  ? insulin aspart  0-15 Units Subcutaneous TID WC  ? insulin aspart  0-5 Units Subcutaneous QHS  ? loratadine  10 mg Oral Daily  ? pantoprazole  40 mg Oral Daily  ? rivaroxaban  15 mg Oral Q supper  ? tamsulosin  0.4 mg Oral Daily  ? tiotropium  18 mcg Inhalation Daily  ? ?Continuous: ? ? ?MEQ:[id number] **OR** acetaminophen, albuterol, HYDROcodone-acetaminophen, metoprolol tartrate, ondansetron **OR** ondansetron (ZOFRAN) IV, polyethylene glycol ? ?Antibiotics: ?Anti-infectives (From admission, onward)  ? ? Start     Dose/Rate Route Frequency Ordered Stop  ? 06/28/21 1115  cefdinir (OMNICEF) capsule 300 mg       ? 300 mg Oral Every 12 hours 06/28/21 1016    ? 06/28/21 0300  vancomycin (VANCOREADY) IVPB 1750 mg/350 mL  Status:  Discontinued       ? 1,750 mg ?175 mL/hr over 120 Minutes Intravenous Every 48 hours 06/26/21 0110 06/27/21 1047  ? 06/26/21 1200  ceFEPIme (MAXIPIME) 2 g in sodium chloride 0.9 % 100 mL IVPB  Status:  Discontinued       ? 2 g ?200 mL/hr over 30 Minutes Intravenous Every 12 hours 06/26/21 0102 06/28/21 1016  ? 06/26/21 0300  vancomycin (VANCOREADY) IVPB 1750 mg/350 mL       ? 1,750 mg ?175 mL/hr over 120 Minutes Intravenous  Once 06/26/21 0301 06/26/21 0518  ? 06/26/21 0030  vancomycin (VANCOREADY) IVPB 1750 mg/350 mL  Status:  Discontinued       ? 1,750 mg ?175 mL/hr over 120 Minutes Intravenous  Once 06/26/21 0021 06/26/21 0301  ? 06/26/21 0015  cefTRIAXone  (ROCEPHIN) 2 g in sodium chloride 0.9 % 100 mL IVPB  Status:  Discontinued       ? 2 g ?200 mL/hr over 30 Minutes Intravenous Every 24 hours 06/26/21 0009 06/26/21 0016  ? 06/26/21 0015  azithromycin (ZITHROMAX) 500 mg in sodium chloride 0.9 % 250 mL IVPB  Status:  Discontinued       ? 500 mg ?250 mL/hr over 60 Minutes Intravenous Every 24 hours 06/26/21 0009 06/26/21 0016  ? 06/25/21 2330  ceFEPIme (MAXIPIME) 2 g in sodium chloride 0.9 % 100 mL IVPB       ? 2 g ?200 mL/hr over 30 Minutes Intravenous  Once 06/25/21 2315 06/26/21 0100  ? 06/25/21 2330  vancomycin (VANCOCIN) IVPB 1000 mg/200 mL premix  Status:  Discontinued       ? 1,000 mg ?200 mL/hr over 60 Minutes Intravenous  Once 06/25/21 2315 06/26/21 0021  ? ?  ? ? ?Objective: ? ?Vital Signs ? ?Vitals:  ? 06/27/21 1946 06/28/21 0156 06/28/21 0512 06/28/21 0742  ?BP: 121/69 130/61 116/74 139/75  ?Pulse: Marland Kitchen)  103 98 99 98  ?Resp: 20 20 18 20   ?Temp: 97.7 ?F (36.5 ?C) 97.8 ?F (36.6 ?C)  98.3 ?F (36.8 ?C)  ?TempSrc:  Oral    ?SpO2: 100% 97% 98% 98%  ?Weight:      ?Height:      ? ? ?Intake/Output Summary (Last 24 hours) at 06/28/2021 1020 ?Last data filed at 06/28/2021 0836 ?Gross per 24 hour  ?Intake 1355 ml  ?Output --  ?Net 1355 ml  ? ? ?Filed Weights  ? 06/25/21 1852  ?Weight: 75.8 kg  ? ? ?General appearance: Awake alert.  In no distress ?Resp: Clear to auscultation bilaterally.  Normal effort ?Cardio: S1-S2 is irregularly irregular.  Tachycardic. ?GI: Abdomen is soft.  Nontender nondistended.  Bowel sounds are present normal.  No masses organomegaly ?Extremities: No edema.  Able to lift both legs off the bed ?Neurologic: No focal neurological deficits.  ? ? ? ?Lab Results: ? ?Data Reviewed: I have personally reviewed labs and imaging study reports ? ?CBC: ?Recent Labs  ?Lab 06/25/21 ?1854 06/27/21 ?0428 06/28/21 ?06/30/21  ?WBC 4.8 3.5* 3.9*  ?NEUTROABS 3.8  --   --   ?HGB 10.1* 9.5* 9.8*  ?HCT 32.7* 30.5* 32.2*  ?MCV 88.4 88.2 89.2  ?PLT 128* 125* 144*  ? ? ? ?Basic  Metabolic Panel: ?Recent Labs  ?Lab 06/25/21 ?1854 06/27/21 ?0428 06/28/21 ?06/30/21  ?NA 136 138 140  ?K 4.1 3.6 3.9  ?CL 98 104 107  ?CO2 28 27 25   ?GLUCOSE 136* 197* 216*  ?BUN 29* 24* 16  ?CREATININE 1.55* 1

## 2021-06-29 DIAGNOSIS — N179 Acute kidney failure, unspecified: Secondary | ICD-10-CM | POA: Diagnosis not present

## 2021-06-29 DIAGNOSIS — I480 Paroxysmal atrial fibrillation: Secondary | ICD-10-CM | POA: Diagnosis not present

## 2021-06-29 DIAGNOSIS — J9601 Acute respiratory failure with hypoxia: Secondary | ICD-10-CM | POA: Diagnosis not present

## 2021-06-29 DIAGNOSIS — J189 Pneumonia, unspecified organism: Secondary | ICD-10-CM | POA: Diagnosis not present

## 2021-06-29 LAB — CBC
HCT: 34.2 % — ABNORMAL LOW (ref 39.0–52.0)
Hemoglobin: 10.2 g/dL — ABNORMAL LOW (ref 13.0–17.0)
MCH: 27.1 pg (ref 26.0–34.0)
MCHC: 29.8 g/dL — ABNORMAL LOW (ref 30.0–36.0)
MCV: 90.7 fL (ref 80.0–100.0)
Platelets: 157 10*3/uL (ref 150–400)
RBC: 3.77 MIL/uL — ABNORMAL LOW (ref 4.22–5.81)
RDW: 13.4 % (ref 11.5–15.5)
WBC: 4.3 10*3/uL (ref 4.0–10.5)
nRBC: 0 % (ref 0.0–0.2)

## 2021-06-29 LAB — GLUCOSE, CAPILLARY
Glucose-Capillary: 241 mg/dL — ABNORMAL HIGH (ref 70–99)
Glucose-Capillary: 213 mg/dL — ABNORMAL HIGH (ref 70–99)
Glucose-Capillary: 239 mg/dL — ABNORMAL HIGH (ref 70–99)
Glucose-Capillary: 224 mg/dL — ABNORMAL HIGH (ref 70–99)

## 2021-06-29 LAB — BASIC METABOLIC PANEL
Anion gap: 7 (ref 5–15)
BUN: 16 mg/dL (ref 8–23)
CO2: 25 mmol/L (ref 22–32)
Calcium: 8.7 mg/dL — ABNORMAL LOW (ref 8.9–10.3)
Chloride: 108 mmol/L (ref 98–111)
Creatinine, Ser: 1.27 mg/dL — ABNORMAL HIGH (ref 0.61–1.24)
GFR, Estimated: 56 mL/min — ABNORMAL LOW (ref >60)
Glucose, Bld: 267 mg/dL — ABNORMAL HIGH (ref 70–99)
Potassium: 4.8 mmol/L (ref 3.5–5.1)
Sodium: 140 mmol/L (ref 135–145)

## 2021-06-29 MED ORDER — HYDROCODONE-ACETAMINOPHEN 5-325 MG PO TABS: 1.0000 | ORAL_TABLET | Freq: Four times a day (QID) | ORAL | Status: DC | PRN

## 2021-06-29 MED ORDER — INSULIN GLARGINE-YFGN 100 UNIT/ML ~~LOC~~ SOLN
12.0000 [IU] | Freq: Every day | SUBCUTANEOUS | Status: DC
  Administered 2021-06-29: 12 [IU] via SUBCUTANEOUS
  Filled 2021-06-29 (×2): qty 0.12

## 2021-06-29 MED ORDER — GABAPENTIN 300 MG PO CAPS
300.0000 mg | ORAL_CAPSULE | Freq: Two times a day (BID) | ORAL | Status: DC
  Administered 2021-06-30 (×2): 300 mg via ORAL
  Filled 2021-06-29 (×2): qty 1

## 2021-06-29 NOTE — Progress Notes (Signed)
? ?[facility] ?PROGRESS NOTE ? ? Lucy Chris?Currie D Damas [id number] DOB: 11/30/1935 DOA: 06/25/2021  4 ?DOS: the patient was seen and examined on 06/29/2021 ? ?PCP: [name], [name], MD ? ?Brief History and Hospital Course:  ?86 y.o. male with medical history significant for  DM, HTN, diastolic heart failure, paroxysmal A. fib on Xarelto and Tikosyn, thyroid nodules, iron deficiency anemia, frequent falls, COPD not currently on home oxygen, chronic opioid use, hospitalized for pneumonia in October and November 2022, strep antigen positive in November, who presented to the ED with a 3 to 4-day history of generalized weakness and shortness of breath.  Patient was noted to be tachycardic on arrival to the emergency department.  CT angiogram of the chest raise concern for multifocal pneumonia.  COVID and influenza PCR's were negative.  Patient was hospitalized for further management.  Respiratory status has improved.  Heart rate has been somewhat poorly controlled the last 48 hours. ? ?Consultants: None ? ?Procedures: None ? ? ? ?Subjective: ?Somnolent this morning.  Was given pain medications earlier today.   ? ? ?Assessment/Plan: ? ? ?* Multifocal pneumonia ?Sepsis present on admission ? ?CT angiogram chest showed multifocal pneumonia.  Patient with previous history of pneumonia. Tachycardia and tachypnea and source of infection without fever or leukocytosis. ?Previously seen by speech therapy and was placed on dysphagia diet but patient apparently does not like the thickened liquids.  Speech therapy consulted again. ?Patient placed on vancomycin and cefepime.  Cultures negative so far.  MRSA PCR negative.  Vancomycin was discontinued.  [facility]  ?Remains afebrile.  WBC normal.  ?Respiratory status is stable.  Continues to require oxygen by nasal cannula.  Will likely needed at discharge.   ?Considering that he has had recurrent hospitalization for similar issues he will benefit from palliative  care evaluation for goals of care.  This can be done at a skilled nursing facility. ? ?Acute respiratory failure with hypoxia (HCC) ?Does not use oxygen at baseline.  Was placed on oxygen at the skilled nursing facility.  He did desaturate overnight.  Most likely will require some oxygen at skilled nursing facility. ? ?Thrombocytopenia (HCC) ?Counts were 128,000, Down from over 300,000 about 4 months.  Platelet counts improving.  Likely due to acute illness. ? ?Generalized weakness ?Secondary to acute illness.  Physical therapy evaluation.  Already is at a skilled nursing facility. ? ?AKI (acute kidney injury) (HCC) ?Creatinine was noted to be 1.55 on presentation.  Baseline 0.73.  Improved with IV fluids.  Monitor urine output. ? ?Paroxysmal atrial fibrillation with RVR (HCC) ?Known history of atrial fibrillation.  Continue diltiazem and dofetilide.  Continue Xarelto.  Tachycardia at the time of admission most likely due to dehydration/infection.   ?Atrial fibrillation has been poorly controlled.  Dose of Cardizem has been increased.  Continue to monitor on telemetry.  Also on metoprolol as needed. ?Continue Tikosyn.  Potassium and magnesium being checked periodically. ? ?Chronic anticoagulation ?Continue Xarelto. ? ?Chronic diastolic heart failure (HCC) ?Hypovolemia noted at admission. Echo from 10/29 with EF 55 to 60% ?Patient not on diuretics or beta-blockers or ACE/ARB ?IV fluids subsequently discontinued. ? ?Chronic disease anemia ?Stable.  No evidence of overt bleeding.  Continue to monitor. ? ?Essential hypertension ?Blood pressure reasonably well controlled. ? ?COPD (chronic obstructive pulmonary disease) (HCC) ?Stable.  Continue home medications. ? ?DM2 (diabetes mellitus, type 2) (HCC) ?HbA1c 7.1 in October.  Patient on Lantus prior to admission.  Was placed on hold.  CBGs noted  to be poorly controlled.  We will reinitiate Lantus in the hospital. ? ?Chronic pain syndrome ?Has chronic back pain. ? ? ? ?DVT  Prophylaxis: On Xarelto ?Code Status: DNR ?Family Communication: Lucila Maine being updated every few days. ?Disposition Plan: Hopefully back to SNF when improved ? ?Status is: Inpatient ?Remains inpatient appropriate because: Multifocal pneumonia, acute respiratory failure with hypoxia ? ? ? ? ?Medications: Scheduled: ? atorvastatin  20 mg Oral QHS  ? budesonide  0.5 mg Inhalation Daily  ? cefdinir  300 mg Oral Q12H  ? diltiazem  300 mg Oral Daily  ? dofetilide  125 mcg Oral BID  ? finasteride  5 mg Oral Daily  ? gabapentin  300 mg Oral BID  ? insulin aspart  0-15 Units Subcutaneous TID WC  ? insulin aspart  0-5 Units Subcutaneous QHS  ? loratadine  10 mg Oral Daily  ? pantoprazole  40 mg Oral Daily  ? rivaroxaban  15 mg Oral Q supper  ? tamsulosin  0.4 mg Oral Daily  ? tiotropium  18 mcg Inhalation Daily  ? ?Continuous: ? ? ?GNO:[id number] **OR** acetaminophen, albuterol, HYDROcodone-acetaminophen, metoprolol tartrate, ondansetron **OR** ondansetron (ZOFRAN) IV, polyethylene glycol ? ?Antibiotics: ?Anti-infectives (From admission, onward)  ? ? Start     Dose/Rate Route Frequency Ordered Stop  ? 06/28/21 1115  cefdinir (OMNICEF) capsule 300 mg       ? 300 mg Oral Every 12 hours 06/28/21 1016    ? 06/28/21 0300  vancomycin (VANCOREADY) IVPB 1750 mg/350 mL  Status:  Discontinued       ? 1,750 mg ?175 mL/hr over 120 Minutes Intravenous Every 48 hours 06/26/21 0110 06/27/21 1047  ? 06/26/21 1200  ceFEPIme (MAXIPIME) 2 g in sodium chloride 0.9 % 100 mL IVPB  Status:  Discontinued       ? 2 g ?200 mL/hr over 30 Minutes Intravenous Every 12 hours 06/26/21 0102 06/28/21 1016  ? 06/26/21 0300  vancomycin (VANCOREADY) IVPB 1750 mg/350 mL       ? 1,750 mg ?175 mL/hr over 120 Minutes Intravenous  Once 06/26/21 0301 06/26/21 0518  ? 06/26/21 0030  vancomycin (VANCOREADY) IVPB 1750 mg/350 mL  Status:  Discontinued       ? 1,750 mg ?175 mL/hr over 120 Minutes Intravenous  Once 06/26/21 0021 06/26/21 0301  ? 06/26/21 0015   cefTRIAXone (ROCEPHIN) 2 g in sodium chloride 0.9 % 100 mL IVPB  Status:  Discontinued       ? 2 g ?200 mL/hr over 30 Minutes Intravenous Every 24 hours 06/26/21 0009 06/26/21 0016  ? 06/26/21 0015  azithromycin (ZITHROMAX) 500 mg in sodium chloride 0.9 % 250 mL IVPB  Status:  Discontinued       ? 500 mg ?250 mL/hr over 60 Minutes Intravenous Every 24 hours 06/26/21 0009 06/26/21 0016  ? 06/25/21 2330  ceFEPIme (MAXIPIME) 2 g in sodium chloride 0.9 % 100 mL IVPB       ? 2 g ?200 mL/hr over 30 Minutes Intravenous  Once 06/25/21 2315 06/26/21 0100  ? 06/25/21 2330  vancomycin (VANCOCIN) IVPB 1000 mg/200 mL premix  Status:  Discontinued       ? 1,000 mg ?200 mL/hr over 60 Minutes Intravenous  Once 06/25/21 2315 06/26/21 0021  ? ?  ? ? ?Objective: ? ?Vital Signs ? ?Vitals:  ? 06/29/21 0227 06/29/21 0346 06/29/21 0348 06/29/21 0810  ?BP:  128/60  129/69  ?Pulse:  (!) 107 (!) 107 (!) 109  ?Resp: 18 18  18  ?Temp:   98.2 ?F (36.8 ?C) 98.2 ?F (36.8 ?C)  ?TempSrc:   Oral   ?SpO2:   97% 99%  ?Weight:      ?Height:      ? ? ?Intake/Output Summary (Last 24 hours) at 06/29/2021 0931 ?Last data filed at 06/29/2021 0532 ?Gross per 24 hour  ?Intake 223.33 ml  ?Output 1600 ml  ?Net -1376.67 ml  ? ? ?Filed Weights  ? 06/25/21 1852  ?Weight: 75.8 kg  ? ? ?General appearance: Somnolent this morning arousable but goes right back to sleep ?Resp: Coarse breath sounds bilaterally.  Diminished air entry at the bases. ?Cardio: S1-S2 is tachycardic.  Irregularly irregular.  No S3-S4. ?GI: Abdomen is soft.  Nontender nondistended.  Bowel sounds are present normal.  No masses organomegaly ?Extremities: No edema.   ?Neurologic:  No focal neurological deficits.  ? ? ? ? ?Lab Results: ? ?Data Reviewed: I have personally reviewed labs and imaging study reports ? ?CBC: ?Recent Labs  ?Lab 06/25/21 ?1854 06/27/21 ?0428 06/28/21 ?4098 06/29/21 ?1191  ?WBC 4.8 3.5* 3.9* 4.3  ?NEUTROABS 3.8  --   --   --   ?HGB 10.1* 9.5* 9.8* 10.2*  ?HCT 32.7* 30.5* 32.2*  34.2*  ?MCV 88.4 88.2 89.2 90.7  ?PLT 128* 125* 144* 157  ? ? ? ?Basic Metabolic Panel: ?Recent Labs  ?Lab 06/25/21 ?1854 06/27/21 ?0428 06/28/21 ?4782 06/29/21 ?9562  ?NA 136 138 140 140  ?K 4.1 3.6 3.9 4.

## 2021-06-29 NOTE — Assessment & Plan Note (Addendum)
Has chronic back pain.  Not noted to be on any narcotics prior to admission based on the medication list.  Was given narcotics here which resulted in confusion.  Narcotics were subsequently discontinued. ?

## 2021-06-30 DIAGNOSIS — J9601 Acute respiratory failure with hypoxia: Secondary | ICD-10-CM | POA: Diagnosis not present

## 2021-06-30 DIAGNOSIS — J189 Pneumonia, unspecified organism: Secondary | ICD-10-CM | POA: Diagnosis not present

## 2021-06-30 DIAGNOSIS — N179 Acute kidney failure, unspecified: Secondary | ICD-10-CM | POA: Diagnosis not present

## 2021-06-30 DIAGNOSIS — I480 Paroxysmal atrial fibrillation: Secondary | ICD-10-CM | POA: Diagnosis not present

## 2021-06-30 LAB — BASIC METABOLIC PANEL
Anion gap: 8 (ref 5–15)
BUN: 18 mg/dL (ref 8–23)
CO2: 25 mmol/L (ref 22–32)
Calcium: 8.7 mg/dL — ABNORMAL LOW (ref 8.9–10.3)
Chloride: 111 mmol/L (ref 98–111)
Creatinine, Ser: 1.29 mg/dL — ABNORMAL HIGH (ref 0.61–1.24)
GFR, Estimated: 55 mL/min — ABNORMAL LOW (ref >60)
Glucose, Bld: 187 mg/dL — ABNORMAL HIGH (ref 70–99)
Potassium: 3.9 mmol/L (ref 3.5–5.1)
Sodium: 144 mmol/L (ref 135–145)

## 2021-06-30 LAB — CBC
HCT: 31.4 % — ABNORMAL LOW (ref 39.0–52.0)
Hemoglobin: 9.4 g/dL — ABNORMAL LOW (ref 13.0–17.0)
MCH: 27 pg (ref 26.0–34.0)
MCHC: 29.9 g/dL — ABNORMAL LOW (ref 30.0–36.0)
MCV: 90.2 fL (ref 80.0–100.0)
Platelets: 190 10*3/uL (ref 150–400)
RBC: 3.48 MIL/uL — ABNORMAL LOW (ref 4.22–5.81)
RDW: 13.5 % (ref 11.5–15.5)
WBC: 4.2 10*3/uL (ref 4.0–10.5)
nRBC: 0 % (ref 0.0–0.2)

## 2021-06-30 LAB — GLUCOSE, CAPILLARY
Glucose-Capillary: 167 mg/dL — ABNORMAL HIGH (ref 70–99)
Glucose-Capillary: 220 mg/dL — ABNORMAL HIGH (ref 70–99)
Glucose-Capillary: 224 mg/dL — ABNORMAL HIGH (ref 70–99)
Glucose-Capillary: 194 mg/dL — ABNORMAL HIGH (ref 70–99)

## 2021-06-30 MED ORDER — INSULIN GLARGINE-YFGN 100 UNIT/ML ~~LOC~~ SOLN
15.0000 [IU] | Freq: Every day | SUBCUTANEOUS | Status: DC
  Administered 2021-07-01 – 2021-07-03 (×3): 15 [IU] via SUBCUTANEOUS
  Filled 2021-06-30 (×3): qty 0.15

## 2021-06-30 MED ORDER — INSULIN GLARGINE-YFGN 100 UNIT/ML ~~LOC~~ SOLN
12.0000 [IU] | Freq: Once | SUBCUTANEOUS | Status: AC
  Administered 2021-06-30: 12 [IU] via SUBCUTANEOUS
  Filled 2021-06-30: qty 0.12

## 2021-06-30 MED ORDER — POTASSIUM CHLORIDE 20 MEQ PO PACK
20.0000 meq | PACK | Freq: Once | ORAL | Status: DC
Start: 2021-06-30 — End: 2021-06-30

## 2021-06-30 MED ORDER — METOPROLOL TARTRATE 25 MG PO TABS
25.0000 mg | ORAL_TABLET | Freq: Two times a day (BID) | ORAL | Status: DC
  Administered 2021-06-30 – 2021-07-03 (×6): 25 mg via ORAL
  Filled 2021-06-30 (×6): qty 1

## 2021-06-30 MED ORDER — POTASSIUM CHLORIDE CRYS ER 20 MEQ PO TBCR
20.0000 meq | EXTENDED_RELEASE_TABLET | Freq: Once | ORAL | Status: AC
  Administered 2021-06-30: 20 meq via ORAL
  Filled 2021-06-30: qty 1

## 2021-06-30 NOTE — Progress Notes (Signed)
?   06/29/21 2235  ?Assess: MEWS Score  ?Temp 98.1 ?F (36.7 ?C)  ?BP 107/70  ?ECG Heart Rate (!) 111  ?Resp 20  ?Level of Consciousness Alert  ?SpO2 97 %  ?O2 Device Room Air  ?Assess: MEWS Score  ?MEWS Temp 0  ?MEWS Systolic 0  ?MEWS Pulse 2  ?MEWS RR 0  ?MEWS LOC 0  ?MEWS Score 2  ?MEWS Score Color Yellow  ?Assess: if the MEWS score is Yellow or Red  ?Were vital signs taken at a resting state? Yes  ?Focused Assessment No change from prior assessment  ?Does the patient meet 2 or more of the SIRS criteria? No  ?Does the patient have a confirmed or suspected source of infection? Yes  ?Provider and Rapid Response Notified? Yes  ?MEWS guidelines implemented *See Row Information* Yes  ?Treat  ?MEWS Interventions Administered prn meds/treatments  ?Pain Scale Faces  ?Pain Score Asleep  ?Faces Pain Scale 0  ?Take Vital Signs  ?Increase Vital Sign Frequency  Yellow: Q 2hr X 2 then Q 4hr X 2, if remains yellow, continue Q 4hrs  ?Escalate  ?MEWS: Escalate Yellow: discuss with charge nurse/RN and consider discussing with provider and RRT  ?Notify: Charge Nurse/RN  ?Name of Charge Nurse/RN Notified Corrie Dandy, RN  ?Date Charge Nurse/RN Notified 06/29/21  ?Time Charge Nurse/RN Notified 2300  ?Notify: Provider  ?Provider Name/Title Ricki Miller, NP  ?Date Provider Notified 06/29/21  ?Time Provider Notified 2302  ?Notification Type Page  ?Notification Reason Other (Comment) ?(elevated HR)  ?Provider response No new orders  ?Date of Provider Response 06/29/21  ?Time of Provider Response 2310  ?Document  ?Patient Outcome Stabilized after interventions  ?Progress note created (see row info) Yes  ?Assess: SIRS CRITERIA  ?SIRS Temperature  0  ?SIRS Pulse 1  ?SIRS Respirations  0  ?SIRS WBC 0  ?SIRS Score Sum  1  ? ? ?

## 2021-06-30 NOTE — Evaluation (Signed)
Physical Therapy Evaluation ?Patient Details ?Name: Samuel Thomas ?MRN: [id number] ?DOB: 1935-10-14 ?Today's Date: 06/30/2021 ? ?History of Present Illness ? 86 y.o. male with medical history significant for  DM, HTN, diastolic heart failure, paroxysmal A. fib on Xarelto and Tikosyn, thyroid nodules, iron deficiency anemia, frequent falls, COPD not currently on home oxygen, chronic opioid use, hospitalized for pneumonia in October and November 2022, strep antigen positive in November, who presented to the ED with a 3 to 4-day history of generalized weakness and shortness of breath. ?  ?Clinical Impression ? Pt admitted with above diagnosis. Pt received upright in bed with meal tray in front of him untouched. Pt drowsy requiring light touch and voice to arouse. Pt difficulty answering subjective questions with confusion as pt is unaware he is at hospital, unsure of situation or time. However pt is oriented to self. Pt on 2L/min with SPO2 >95%. Titrated to RA with Spo2 >/= 90% at rest and with mobility. Per grandson after session, pt lives at [employer] and at baseline he is a household and community ambulator with [facility]. Today pt able to transfer to Eob with minA at torso and stand and ambulate in room with minguard. Pt ambulates with RW anteriorly outside BOS despite VC/TC's but is stable with RW with decreased step length bilaterally. Pt displaying slow, but safe transfers with correct sequencing of RW and safe hand placement returning to seated in recliner. All needs in reach with chair alarm engaged and call bell within reach, pt has good understanding of which button to hit if he needs assistance. RN aware of session and pt remaining on RA.  Pt currently with functional limitations due to the deficits listed below (see PT Problem List). Pt will benefit from skilled PT to increase their independence and safety with mobility to allow discharge to the venue listed below.  ?   ? ?Recommendations for follow up therapy  are one component of a multi-disciplinary discharge planning process, led by the attending physician.  Recommendations may be updated based on patient status, additional functional criteria and insurance authorization. ? ?Follow Up Recommendations Home health PT ? ?  ?Assistance Recommended at Discharge Frequent or constant Supervision/Assistance  ?Patient can return home with the following ? A little help with walking and/or transfers;Assistance with cooking/housework;Assist for transportation;Direct supervision/assist for financial management;Direct supervision/assist for medications management;Help with stairs or ramp for entrance ? ?  ?Equipment Recommendations Rolling walker (2 wheels)  ?Recommendations for Other Services ?    ?  ?Functional Status Assessment Patient has had a recent decline in their functional status and demonstrates the ability to make significant improvements in function in a reasonable and predictable amount of time.  ? ?  ?Precautions / Restrictions Precautions ?Precautions: Fall ?Restrictions ?Weight Bearing Restrictions: No  ? ?  ? ?Mobility ? Bed Mobility ?Overal bed mobility: Needs Assistance ?Bed Mobility: Supine to Sit ?  ?  ?Supine to sit: HOB elevated, Min assist ?  ?  ?General bed mobility comments: minA at torso ?Patient Response: Cooperative ? ?Transfers ?Overall transfer level: Needs assistance ?Equipment used: Rolling walker (2 wheels) ?Transfers: Sit to/from Stand ?Sit to Stand: Min guard ?  ?  ?  ?  ?  ?General transfer comment: VC's for hand placement ?  ? ?Ambulation/Gait ?Ambulation/Gait assistance: Min guard ?Gait Distance (Feet): 25 Feet ?Assistive device: Rolling walker (2 wheels) ?Gait Pattern/deviations: Step-through pattern, Decreased step length - right, Decreased step length - left ?Gait velocity: decreased ?  ?  ?General  Gait Details: Pt amb at decreased cadence with step through pattern with good stability with RW. Maintains RW outside BOS anteriorly despite  VC's ? ?Stairs ?  ?  ?  ?  ?  ? ?Wheelchair Mobility ?  ? ?Modified Rankin (Stroke Patients Only) ?  ? ?  ? ?Balance Overall balance assessment: Needs assistance ?Sitting-balance support: Bilateral upper extremity supported, Feet supported ?Sitting balance-Leahy Scale: Fair ?  ?  ?  ?Standing balance-Leahy Scale: Fair ?Standing balance comment: with RW ?  ?  ?  ?  ?  ?  ?  ?  ?  ?  ?  ?   ? ? ? ?Pertinent Vitals/Pain Pain Assessment ?Pain Assessment: No/denies pain  ? ? ?Home Living Family/patient expects to be discharged to:: Assisted living ?  ?  ?  ?  ?  ?  ?  ?  ?  ?Additional Comments: Lives at [facility] in long term care  ?  ?Prior Function Prior Level of Function : Needs assist;Independent/Modified Independent ?  ?  ?  ?  ?  ?  ?Mobility Comments: Per grand son pt uses SPC at ALF and in community ?  ?  ? ? ?Hand Dominance  ? Dominant Hand: Right ? ?  ?Extremity/Trunk Assessment  ? Upper Extremity Assessment ?Upper Extremity Assessment: Defer to OT evaluation ?  ? ?Lower Extremity Assessment ?Lower Extremity Assessment: Generalized weakness ?  ? ?Cervical / Trunk Assessment ?Cervical / Trunk Assessment: Kyphotic  ?Communication  ? Communication: HOH  ?Cognition Arousal/Alertness: Lethargic ?Behavior During Therapy: [facility] for tasks assessed/performed ?Overall Cognitive Status: Impaired/Different from baseline ?Area of Impairment: Orientation ?  ?  ?  ?  ?  ?  ?  ?  ?Orientation Level: Disoriented to, Place, Situation ?  ?  ?  ?  ?  ?  ?General Comments: Pt requiring increased time to follow commands. Lethargic at first but after sitting EOB pt more alert ?  ?  ? ?  ?General Comments General comments (skin integrity, edema, etc.): HR trneidn in mid 100's BPm at rest and with mobility. SPo2 >/=90% on RA at rest and with mobility ? ?  ?Exercises Other Exercises ?Other Exercises: Role of PT in acute setting, safe use of RW, d/c recs  ? ?Assessment/Plan  ?  ?PT Assessment Patient needs continued PT services  ?PT  Problem List Decreased strength;Decreased balance;Decreased knowledge of use of DME;Decreased mobility ? ?   ?  ?PT Treatment Interventions DME instruction;Therapeutic exercise;Gait training;Balance training;Stair training;Neuromuscular re-education;Functional mobility training;Therapeutic activities;Patient/family education   ? ?PT Goals (Current goals can be found in the Care Plan section)  ?Acute Rehab PT Goals ?PT Goal Formulation: Patient unable to participate in goal setting ? ?  ?Frequency Min 2X/week ?  ? ? ?Co-evaluation   ?  ?  ?  ?  ? ? ?  ?AM-PAC PT "6 Clicks" Mobility  ?Outcome Measure Help needed turning from your back to your side while in a flat bed without using bedrails?: A Little ?Help needed moving from lying on your back to sitting on the side of a flat bed without using bedrails?: A Little ?Help needed moving to and from a bed to a chair (including a wheelchair)?: A Little ?Help needed standing up from a chair using your arms (e.g., wheelchair or bedside chair)?: A Little ?Help needed to walk in hospital room?: A Little ?Help needed climbing 3-5 steps with a railing? : A Lot ?6 Click Score: 17 ? ?  ?  End of Session Equipment Utilized During Treatment: Gait belt ?Activity Tolerance: Patient tolerated treatment well ?Patient left: in chair;with chair alarm set ?Nurse Communication: Mobility status ?PT Visit Diagnosis: Muscle weakness (generalized) (M62.81);Difficulty in walking, not elsewhere classified (R26.2) ?  ? ?Time: [id number] ?PT Time Calculation (min) (ACUTE ONLY): 31 min ? ? ?Charges:   PT Evaluation ?$PT Eval Moderate Complexity: 1 Mod ?PT Treatments ?$Gait Training: 8-22 mins ?  ?   ? ? ?Salem Caster. Fairly IV, PT, DPT ?Physical Therapist- [place]  ?[facility]  ?06/30/2021, 2:20 PM ? ?

## 2021-06-30 NOTE — Progress Notes (Signed)
Inpatient Diabetes Program Recommendations ? ?AACE/ADA: New Consensus Statement on Inpatient Glycemic Control (2015) ? ?Target Ranges:  Prepandial:   less than 140 mg/dL ?     Peak postprandial:   less than 180 mg/dL (1-2 hours) ?     Critically ill patients:  140 - 180 mg/dL  ? ? Latest Reference Range & Units 06/29/21 08:12 06/29/21 12:08 06/29/21 18:34 06/29/21 20:24  ?Glucose-Capillary 70 - 99 mg/dL 542 (H) ? ?5 units Novolog @0931  ? 213 (H) ? ?5 units Novolog @1324  ? ?12 units Semglee @1325  239 (H) ? ?5 units Novolog @1900  224 (H) ? ?2 units Novolog @2152   ? ? Latest Reference Range & Units 06/30/21 07:47 06/30/21 11:34  ?Glucose-Capillary 70 - 99 mg/dL (H) ? ?3 units Novolog 220 (H) ? ? ? ?12 units Semglee @1101   ?(H): Data is abnormally high ? ? ? ?Home DM Meds: Lantus 26 units QHS ?  Humalog 2-12 units tid ? Metformin 1000mg  bid ? ? ?Current orders: Semglee 15 units daily ?Novolog 0-15 units TID AC + HS ? ? ? ?MD- Note Semglee increased today (will get increased dose tomorrow AM 03/21) ? ?Afternoon CBGs also elevated ? ?Please consider starting Novolog Meal Coverage: Novolog 4 units TID with meals ?HOLD if pt eats <50% meals ? ? ? ?--Will follow patient during hospitalization-- ? ? RN, MSN, CDE ?Diabetes Coordinator ?Inpatient Glycemic Control Team ?Team Pager: [phone] (8a-5p) ? ? ?

## 2021-06-30 NOTE — Progress Notes (Signed)
? ?[facility] ?PROGRESS NOTE ? ? KYREL BREES [id number] DOB: November 26, 1935 DOA: 06/25/2021  5 ?DOS: the patient was seen and examined on 06/30/2021 ? ?PCP: Danae Orleans, MD ? ?Brief History and Hospital Course:  ?86 y.o. male with medical history significant for  DM, HTN, diastolic heart failure, paroxysmal A. fib on Xarelto and Tikosyn, thyroid nodules, iron deficiency anemia, frequent falls, COPD not currently on home oxygen, chronic opioid use, hospitalized for pneumonia in October and November 2022, strep antigen positive in November, who presented to the ED with a 3 to 4-day history of generalized weakness and shortness of breath.  Patient was noted to be tachycardic on arrival to the emergency department.  CT angiogram of the chest raise concern for multifocal pneumonia.  COVID and influenza PCR's were negative.  Patient was hospitalized for further management.  Respiratory status has improved.  Heart rate is better but still somewhat poorly controlled. ? ?Consultants: None ? ?Procedures: None ? ? ? ?Subjective: ?More awake and alert this morning.  Denies any complaints at this time. ? ? ?Assessment/Plan: ? ? ?* Multifocal pneumonia ?Sepsis present on admission ? ?CT angiogram chest showed multifocal pneumonia.  Patient with previous history of pneumonia. Tachycardia and tachypnea and source of infection without fever or leukocytosis. ?Previously seen by speech therapy and was placed on dysphagia diet but patient apparently does not like the thickened liquids.  Seen by speech therapy again. ?Patient placed on vancomycin and cefepime.  Cultures were negative. MRSA PCR negative.  Vancomycin was discontinued.  Cefepime changed over to [facility].  ?Remains afebrile.  WBC normal.  ?Respiratory status is stable.  Continues to require oxygen by nasal cannula.  Will likely needed at discharge.   ?Considering that he has had recurrent hospitalization for similar issues he will benefit from palliative care  evaluation for goals of care.  This can be done at a skilled nursing facility. ? ?Acute respiratory failure with hypoxia ([place]) ?Does not use oxygen at baseline.  Was placed on oxygen at the skilled nursing facility.  He did desaturate overnight.  Most likely will require some oxygen at skilled nursing facility. ? ?Thrombocytopenia ([place]) ?Counts were 128,000, Down from over 300,000 about 4 months.  Likely due to acute illness. Platelet counts improving.   ? ?Generalized weakness ?Secondary to acute illness.  Physical therapy evaluation.  Already is at a skilled nursing facility. ? ?AKI (acute kidney injury) ([place]) ?Creatinine was noted to be 1.55 on presentation.  Baseline 0.73.  Improved with IV fluids.  Monitor urine output. ? ?Paroxysmal atrial fibrillation with RVR ([place]) ?Known history of atrial fibrillation.  Continue diltiazem and dofetilide.  Continue Xarelto.  Tachycardia at the time of admission most likely due to dehydration/infection.   ?Atrial fibrillation has been poorly controlled during this hospital stay.  Dose of Cardizem was increased.  Metoprolol was added.  We will see how the trends are over the next 24 hours. ?Continue Tikosyn.  Potassium and magnesium being checked periodically.  Replace potassium. ? ?Chronic anticoagulation ?Continue Xarelto. ? ?Chronic diastolic heart failure ([place]) ?Hypovolemia noted at admission. Echo from 10/29 with EF 55 to 60% ?Patient not on diuretics or beta-blockers or ACE/ARB ?IV fluids subsequently discontinued. ? ?Chronic disease anemia ?Stable.  No evidence of overt bleeding.  Continue to monitor. ? ?Essential hypertension ?Blood pressure reasonably well controlled. ? ?COPD (chronic obstructive pulmonary disease) ([place]) ?Stable.  Continue home medications. ? ?DM2 (diabetes mellitus, type 2) ([place]) ?HbA1c 7.1 in October.  Patient on Lantus prior  to admission.  Was placed on hold.  CBGs were poorly controlled.  Lantus was reinitiated. ? ?Chronic pain syndrome ?Has  chronic back pain.  Not noted to be on any narcotics prior to admission based on the medication list. ? ? ? ?DVT Prophylaxis: On Xarelto ?Code Status: DNR ?Family Communication: Yolanda Bonine being updated every few days. ?Disposition Plan: Hopefully back to SNF when heart rate has improved.  Possibly tomorrow. ? ?Status is: Inpatient ?Remains inpatient appropriate because: Multifocal pneumonia, acute respiratory failure with hypoxia ? ? ? ? ?Medications: Scheduled: ? atorvastatin  20 mg Oral QHS  ? budesonide  0.5 mg Inhalation Daily  ? cefdinir  300 mg Oral Q12H  ? diltiazem  300 mg Oral Daily  ? dofetilide  125 mcg Oral BID  ? finasteride  5 mg Oral Daily  ? gabapentin  300 mg Oral BID  ? insulin aspart  0-15 Units Subcutaneous TID WC  ? insulin aspart  0-5 Units Subcutaneous QHS  ? insulin glargine-yfgn  12 Units Subcutaneous Daily  ? loratadine  10 mg Oral Daily  ? metoprolol tartrate  25 mg Oral BID  ? pantoprazole  40 mg Oral Daily  ? rivaroxaban  15 mg Oral Q supper  ? tamsulosin  0.4 mg Oral Daily  ? tiotropium  18 mcg Inhalation Daily  ? ?Continuous: ? ? ?[id number] **OR** acetaminophen, albuterol, HYDROcodone-acetaminophen, metoprolol tartrate, ondansetron **OR** ondansetron (ZOFRAN) IV, polyethylene glycol ? ?Antibiotics: ?Anti-infectives (From admission, onward)  ? ? Start     Dose/Rate Route Frequency Ordered Stop  ? 06/28/21 1115  cefdinir (OMNICEF) capsule 300 mg       ? 300 mg Oral Every 12 hours 06/28/21 1016    ? 06/28/21 0300  vancomycin (VANCOREADY) IVPB 1750 mg/350 mL  Status:  Discontinued       ? 1,750 mg ?175 mL/hr over 120 Minutes Intravenous Every 48 hours 06/26/21 0110 06/27/21 1047  ? 06/26/21 1200  ceFEPIme (MAXIPIME) 2 g in sodium chloride 0.9 % 100 mL IVPB  Status:  Discontinued       ? 2 g ?200 mL/hr over 30 Minutes Intravenous Every 12 hours 06/26/21 0102 06/28/21 1016  ? 06/26/21 0300  vancomycin (VANCOREADY) IVPB 1750 mg/350 mL       ? 1,750 mg ?175 mL/hr over 120 Minutes  Intravenous  Once 06/26/21 0301 06/26/21 0518  ? 06/26/21 0030  vancomycin (VANCOREADY) IVPB 1750 mg/350 mL  Status:  Discontinued       ? 1,750 mg ?175 mL/hr over 120 Minutes Intravenous  Once 06/26/21 0021 06/26/21 0301  ? 06/26/21 0015  cefTRIAXone (ROCEPHIN) 2 g in sodium chloride 0.9 % 100 mL IVPB  Status:  Discontinued       ? 2 g ?200 mL/hr over 30 Minutes Intravenous Every 24 hours 06/26/21 0009 06/26/21 0016  ? 06/26/21 0015  azithromycin (ZITHROMAX) 500 mg in sodium chloride 0.9 % 250 mL IVPB  Status:  Discontinued       ? 500 mg ?250 mL/hr over 60 Minutes Intravenous Every 24 hours 06/26/21 0009 06/26/21 0016  ? 06/25/21 2330  ceFEPIme (MAXIPIME) 2 g in sodium chloride 0.9 % 100 mL IVPB       ? 2 g ?200 mL/hr over 30 Minutes Intravenous  Once 06/25/21 2315 06/26/21 0100  ? 06/25/21 2330  vancomycin (VANCOCIN) IVPB 1000 mg/200 mL premix  Status:  Discontinued       ? 1,000 mg ?200 mL/hr over 60 Minutes Intravenous  Once 06/25/21 2315  06/26/21 0021  ? ?  ? ? ?Objective: ? ?Vital Signs ? ?Vitals:  ? 06/30/21 0430 06/30/21 0441 06/30/21 0735 06/30/21 0801  ?BP: 118/68   128/79  ?Pulse:    100  ?Resp: 20 19  19   ?Temp: 98.4 ?F (36.9 ?C)   98.9 ?F (37.2 ?C)  ?TempSrc: Axillary   Oral  ?SpO2: 92% 95% 96% 96%  ?Weight:      ?Height:      ? ? ?Intake/Output Summary (Last 24 hours) at 06/30/2021 1038 ?Last data filed at 06/29/2021 1339 ?Gross per 24 hour  ?Intake --  ?Output 300 ml  ?Net -300 ml  ? ? ?Filed Weights  ? 06/25/21 1852  ?Weight: 75.8 kg  ? ? ?General appearance: Awake alert.  In no distress ?Resp: Diminished air entry at the bases.  No definite wheezing or rhonchi.  Few crackles. ?Cardio: S1-S2 is irregularly irregular.  Telemetry reviewed.  Heart rate between 100-110 over the last 24 hours. ?GI: Abdomen is soft.  Nontender nondistended.  Bowel sounds are present normal.  No masses organomegaly ?Extremities: No edema.  Full range of motion of lower extremities. ?Neurologic:  No focal neurological  deficits.  ? ? ? ? ?Lab Results: ? ?Data Reviewed: I have personally reviewed labs and imaging study reports ? ?CBC: ?Recent Labs  ?Lab 06/25/21 ?1854 06/27/21 ?0428 06/28/21 ?[id number] 06/29/21 ?[id number] 06/30/21 ?0427  ?WBC 4.8 3

## 2021-06-30 NOTE — Evaluation (Signed)
Occupational Therapy Evaluation ?Patient Details ?Name:  Samuel Thomas ?MRN: [id number] ?DOB: 10/30/1935 ?Today's Date: 06/30/2021 ? ? ?History of Present Illness 86 y.o. male with medical history significant for  DM, HTN, diastolic heart failure, paroxysmal A. fib on Xarelto and Tikosyn, thyroid nodules, iron deficiency anemia, frequent falls, COPD not currently on home oxygen, chronic opioid use, hospitalized for pneumonia in October and November 2022, strep antigen positive in November, who presented to the ED with a 3 to 4-day history of generalized weakness and shortness of breath. Pt admitted for multifocal pneumonia  ? ?Clinical Impression ?  ?Pt seen for OT evaluation this date. Upon arrival to room, pt awake and seated upright in recliner on RA (SpO2 91%). Pt alert and oriented to self only. No family present at time of eval to confirm PLOF. Per chart review, pt was residing at [facility] and [facility]. No information regarding PLOF for performing ADLs/IADLs; plan to confirm with family at later date as able. This date, pt walked to sink requiring MIN GUARD-MIN A (MIN A for managing RW around environmental obstacles) d/t difficulty sequencing and decreased balance/strength. Once standing at sink, pt unable to participate in standing grooming tasks d/t requiring b/l UE support to maintain standing balance. Once seated in recliner, pt required MIN A to sequence oral care and set-up assist to wash/dry face with washcloth. At end of session, pt left sitting upright in recliner with all needs within reach. Pt would benefit from additional skilled OT services to maximize return to PLOF and minimize risk of future falls, injury, caregiver burden, and readmission. Upon discharge, recommend SNF.     ?   ? ?Recommendations for follow up therapy are one component of a multi-disciplinary discharge planning process, led by the attending physician.  Recommendations may be updated based on patient  status, additional functional criteria and insurance authorization.  ? ?Follow Up Recommendations ? Skilled nursing-short term rehab (<3 hours/day)  ?  ?Assistance Recommended at Discharge Frequent or constant Supervision/Assistance  ?Patient can return home with the following A little help with walking and/or transfers;A lot of help with bathing/dressing/bathroom ? ?  ?Functional Status Assessment ? Patient has had a recent decline in their functional status and demonstrates the ability to make significant improvements in function in a reasonable and predictable amount of time.  ?Equipment Recommendations ? Other (comment) (defer to next venue of care)  ?  ?   ?Precautions / Restrictions Precautions ?Precautions: Fall ?Restrictions ?Weight Bearing Restrictions: No  ? ?  ? ?Mobility Bed Mobility ?  ?  ?  ?  ?  ?  ?  ?General bed mobility comments: not assessed, in recliner pre/post session ?  ? ?Transfers ?Overall transfer level: Needs assistance ?Equipment used: Rolling walker (2 wheels) ?Transfers: Sit to/from Stand ?Sit to Stand: Min assist ?  ?  ?  ?  ?  ?General transfer comment: VC's for hand placement and MIN A for steadying upon standing ?  ? ?  ?Balance Overall balance assessment: Needs assistance ?Sitting-balance support: Bilateral upper extremity supported, Feet supported ?Sitting balance-Leahy Scale: Fair ?  ?  ?Standing balance support: Bilateral upper extremity supported, During functional activity ?Standing balance-Leahy Scale: Fair ?Standing balance comment: Unable to participate in standing grooming tasks d/t requiring b/l UE support and MIN GUARD standing at sink ?  ?  ?  ?  ?  ?  ?  ?  ?  ?  ?  ?   ? ?ADL  either performed or assessed with clinical judgement  ? ?ADL Overall ADL's : Needs assistance/impaired ?  ?  ?Grooming: Wash/dry face;Oral care;Minimal assistance;Sitting ?Grooming Details (indicate cue type and reason): Requires MIN A for applying toothpaste on toothbrush. Requires cues for  sequencing + increased processing time ?  ?  ?  ?  ?  ?  ?  ?  ?  ?  ?  ?  ?  ?  ?Functional mobility during ADLs: Min guard;Minimal assistance;Rolling walker (2 wheels) (Requires MIN A for navigating RW around environmental obstacles. MIN GUARD otherwise) ?   ? ? ? ?Vision Patient Visual Report: No change from baseline ?   ?   ?   ?   ? ?Pertinent Vitals/Pain Pain Assessment ?Pain Assessment: No/denies pain  ? ? ? ?Hand Dominance Right ?  ?Extremity/Trunk Assessment Upper Extremity Assessment ?Upper Extremity Assessment: Generalized weakness ?  ?Lower Extremity Assessment ?Lower Extremity Assessment: Generalized weakness ?  ?Cervical / Trunk Assessment ?Cervical / Trunk Assessment: Kyphotic ?  ?Communication Communication ?Communication: HOH ?  ?Cognition Arousal/Alertness: Awake/alert ?Behavior During Therapy: [facility] for tasks assessed/performed ?Overall Cognitive Status: No family/caregiver present to determine baseline cognitive functioning ?  ?  ?  ?  ?  ?  ?  ?  ?  ?Orientation Level: Disoriented to, Place, Situation ?  ?  ?  ?  ?  ?  ?General Comments: Pt alert and oriented to self only. Requires verbals cues for sequencing ADLs and managing RW during functional mobility ?  ?  ?   ?   ?   ? ? ?Home Living Family/patient expects to be discharged to:: Assisted living ?  ?  ?  ?  ?  ?  ?  ?  ?  ?  ?  ?  ?  ?  ?  ?  ?Additional Comments: Lives at [facility] in long term care ?  ? ?  ?Prior Functioning/Environment Prior Level of Function : Needs assist;Independent/Modified Independent ?  ?  ?  ?  ?  ?  ?Mobility Comments: Per grand son pt uses SPC at ALF and in community ?ADLs Comments: No family present to share pt's PLOF with ADLs/IADLs ?  ? ?  ?  ?OT Problem List: Decreased strength;Decreased activity tolerance;Impaired balance (sitting and/or standing);Decreased cognition;Decreased safety awareness ?  ?   ?OT Treatment/Interventions: Self-care/ADL training;DME and/or AE instruction;Therapeutic  exercise;Therapeutic activities;Visual/perceptual remediation/compensation;Patient/family education;Balance training  ?  ?OT Goals(Current goals can be found in the care plan section) Acute Rehab OT Goals ?OT Goal Formulation: Patient unable to participate in goal setting ?Time For Goal Achievement: 07/14/21 ?Potential to Achieve Goals: Good ?ADL Goals ?Pt Will Perform Grooming: standing;with min guard assist ?Pt Will Transfer to Toilet: with supervision;ambulating;regular height toilet;grab bars ?Pt Will Perform Toileting - Clothing Manipulation and hygiene: with supervision;sitting/lateral leans  ?OT Frequency: Min 2X/week ?  ? ?   ?AM-PAC OT "6 Clicks" Daily Activity     ?Outcome Measure Help from another person eating meals?: A Little ?Help from another person taking care of personal grooming?: A Little ?Help from another person toileting, which includes using toliet, bedpan, or urinal?: A Lot ?Help from another person bathing (including washing, rinsing, drying)?: A Lot ?Help from another person to put on and taking off regular upper body clothing?: A Little ?Help from another person to put on and taking off regular lower body clothing?: A Lot ?6 Click Score: 15 ?  ?End of Session Equipment Utilized During Treatment: Rolling walker (  2 wheels) ?Nurse Communication: Mobility status ? ?Activity Tolerance: Patient tolerated treatment well ?Patient left: in chair;with call bell/phone within reach;with chair alarm set ? ?OT Visit Diagnosis: Unsteadiness on feet (R26.81)  ?              ?Time: 9675-9163 ?OT Time Calculation (min): 28 min ?Charges:  OT General Charges ?$OT Visit: 1 Visit ?OT Evaluation ?$OT Eval Moderate Complexity: 1 Mod ?OT Treatments ?$Self Care/Home Management : 8-22 mins ? ?Matthew Folks, OTR/L ?[facility] [phone] ? ?

## 2021-07-01 ENCOUNTER — Inpatient Hospital Stay: Payer: Medicare Other

## 2021-07-01 LAB — CBC
HCT: 31.7 % — ABNORMAL LOW (ref 39.0–52.0)
Hemoglobin: 9.7 g/dL — ABNORMAL LOW (ref 13.0–17.0)
MCH: 27.4 pg (ref 26.0–34.0)
MCHC: 30.6 g/dL (ref 30.0–36.0)
MCV: 89.5 fL (ref 80.0–100.0)
Platelets: 225 10*3/uL (ref 150–400)
RBC: 3.54 MIL/uL — ABNORMAL LOW (ref 4.22–5.81)
RDW: 13.3 % (ref 11.5–15.5)
WBC: 3.9 10*3/uL — ABNORMAL LOW (ref 4.0–10.5)
nRBC: 0 % (ref 0.0–0.2)

## 2021-07-01 LAB — BASIC METABOLIC PANEL
Anion gap: 8 (ref 5–15)
BUN: 19 mg/dL (ref 8–23)
CO2: 28 mmol/L (ref 22–32)
Calcium: 8.8 mg/dL — ABNORMAL LOW (ref 8.9–10.3)
Chloride: 107 mmol/L (ref 98–111)
Creatinine, Ser: 1.15 mg/dL (ref 0.61–1.24)
GFR, Estimated: >60 mL/min (ref >60)
Glucose, Bld: 203 mg/dL — ABNORMAL HIGH (ref 70–99)
Potassium: 3.8 mmol/L (ref 3.5–5.1)
Sodium: 143 mmol/L (ref 135–145)

## 2021-07-01 LAB — GLUCOSE, CAPILLARY
Glucose-Capillary: 146 mg/dL — ABNORMAL HIGH (ref 70–99)
Glucose-Capillary: 166 mg/dL — ABNORMAL HIGH (ref 70–99)
Glucose-Capillary: 147 mg/dL — ABNORMAL HIGH (ref 70–99)
Glucose-Capillary: 153 mg/dL — ABNORMAL HIGH (ref 70–99)

## 2021-07-01 LAB — CULTURE, BLOOD (ROUTINE X 2)
Culture: NO GROWTH
Culture: NO GROWTH
Special Requests: ADEQUATE

## 2021-07-01 MED ORDER — POTASSIUM CHLORIDE CRYS ER 20 MEQ PO TBCR
20.0000 meq | EXTENDED_RELEASE_TABLET | Freq: Two times a day (BID) | ORAL | Status: AC
  Administered 2021-07-01 (×2): 20 meq via ORAL
  Filled 2021-07-01 (×2): qty 1

## 2021-07-01 NOTE — Consult Note (Signed)
PHARMACY CONSULT NOTE - FOLLOW UP ? ?Pharmacy Consult for Electrolyte Monitoring and Replacement  ? ?Recent Labs: ?Potassium (mmol/L)  ?Date Value  ?07/01/2021 3.8  ? ?Magnesium (mg/dL)  ?Date Value  ?06/28/2021 2.1  ? ?Calcium (mg/dL)  ?Date Value  ?07/01/2021 8.8 (L)  ? ?Albumin (g/dL)  ?Date Value  ?06/25/2021 2.1 (L)  ? ?Phosphorus (mg/dL)  ?Date Value  ?02/10/2021 3.0  ? ?Sodium (mmol/L)  ?Date Value  ?07/01/2021 143  ? ? ?Assessment: ?Patient on Tikosyn PTA. Noted K remains under 4 after K replacement yesterday. MD agree on pharmacy consult to keep K at 4 or above. ? ?Goal of Therapy:  ?K =/> 4 ? ?Plan:  ?Replace with Kcl 20mg  BID x 2 doses today. ?Repeat K level in AM and replace as needed. ? ?Arvis Zwahlen Rodriguez-Guzman PharmD, BCPS ?07/01/2021 9:03 AM ? ? ?

## 2021-07-01 NOTE — Progress Notes (Signed)
Occupational Therapy Treatment ?Patient Details ?Name: Samuel Thomas ?MRN: [id number] ?DOB: Jul 17, 1935 ?Today's Date: 07/01/2021 ? ? ?History of present illness 86 y.o. male with medical history significant for  DM, HTN, diastolic heart failure, paroxysmal A. fib on Xarelto and Tikosyn, thyroid nodules, iron deficiency anemia, frequent falls, COPD not currently on home oxygen, chronic opioid use, hospitalized for pneumonia in October and November 2022, strep antigen positive in November, who presented to the ED with a 3 to 4-day history of generalized weakness and shortness of breath. Pt admitted for multifocal pneumonia ?  ?OT comments ? Pt seen for OT treatment on this date. Upon arrival to room, pt asleep in bed however easily awoken and agreeable to OT tx. Pt alert and oriented to self only. Pt currently requires MIN GUARD for bed mobility, MIN GUARD for functional mobility of short household distances with RW, and MIN GUARD-MIN A for standing grooming tasks d/t decreased balance, activity tolerance, and awareness of deficits. Following x3 standing grooming tasks, pt endorses feeling lightheaded. Once seated in recliner, vitals obtained (BP 117/85, SpO2 >90%, HR 90s). Once seated in recliner, pt in no acute distress and left with all needs within reach. Pt is making good progress toward goals and continues to benefit from skilled OT services to maximize return to PLOF and minimize risk of future falls, injury, caregiver burden, and readmission. Will continue to follow POC. Discharge recommendation remains appropriate.    ? ?Recommendations for follow up therapy are one component of a multi-disciplinary discharge planning process, led by the attending physician.  Recommendations may be updated based on patient status, additional functional criteria and insurance authorization. ?   ?Follow Up Recommendations ? Skilled nursing-short term rehab (<3 hours/day)  ?  ?Assistance Recommended at Discharge Frequent or constant  Supervision/Assistance  ?Patient can return home with the following ? A little help with walking and/or transfers;A lot of help with bathing/dressing/bathroom ?  ?Equipment Recommendations ? Other (comment) (defer to next venue of care)  ?  ?   ?Precautions / Restrictions Precautions ?Precautions: Fall ?Restrictions ?Weight Bearing Restrictions: No  ? ? ?  ? ?Mobility Bed Mobility ?Overal bed mobility: Needs Assistance ?Bed Mobility: Supine to Sit ?  ?  ?Supine to sit: HOB elevated, Min assist ?  ?  ?  ?  ? ?Transfers ?Overall transfer level: Needs assistance ?Equipment used: Rolling walker (2 wheels) ?Transfers: Sit to/from Stand ?Sit to Stand: Min guard ?  ?  ?  ?  ?  ?General transfer comment: VC's for hand placement ?  ?  ?Balance Overall balance assessment: Needs assistance ?Sitting-balance support: Bilateral upper extremity supported, Feet supported ?Sitting balance-Leahy Scale: Fair ?  ?  ?Standing balance support: Single extremity supported, During functional activity ?Standing balance-Leahy Scale: Fair ?Standing balance comment: required MIN GUARD during standing grooming tasks ?  ?  ?  ?  ?  ?  ?  ?  ?  ?  ?  ?   ? ?ADL either performed or assessed with clinical judgement  ? ?ADL Overall ADL's : Needs assistance/impaired ?  ?  ?Grooming: Wash/dry hands;Wash/dry face;Oral care;Minimal assistance;Standing ?Grooming Details (indicate cue type and reason): Requires MIN A for applying toothpaste on toothbrush. Requires cues for sequencing + increased processing time ?  ?  ?  ?  ?  ?  ?  ?  ?  ?  ?  ?  ?  ?  ?Functional mobility during ADLs: Min guard;Minimal assistance;Rolling walker (2 wheels) (Requires MIN A  for navigating RW around environmental obstacles. MIN GUARD otherwise) ?  ?  ? ? ? ?Cognition Arousal/Alertness: Awake/alert ?Behavior During Therapy: [facility] for tasks assessed/performed ?Overall Cognitive Status: No family/caregiver present to determine baseline cognitive functioning ?  ?  ?  ?  ?  ?  ?  ?   ?  ?  ?  ?  ?  ?  ?  ?  ?General Comments: Pt alert and oriented to self only. Requires verbals cues for sequencing ADLs and managing RW during functional mobility ?  ?  ?   ?   ?   ?General Comments Following x3 standing grooming tasks, pt reporting feeling lightheaded. Once seated, vitals obtained (BP 117/85, SpO2 >90%, HR 90s)  ? ? ?Pertinent Vitals/ Pain       Pain Assessment ?Pain Assessment: No/denies pain ? ?   ?   ? ?Frequency ? Min 2X/week  ? ? ? ? ?  ?Progress Toward Goals ? ?OT Goals(current goals can now be found in the care plan section) ? Progress towards OT goals: Progressing toward goals ? ?Acute Rehab OT Goals ?OT Goal Formulation: Patient unable to participate in goal setting ?Time For Goal Achievement: 07/14/21 ?Potential to Achieve Goals: Good  ?Plan Discharge plan remains appropriate;Frequency remains appropriate   ? ?   ?AM-PAC OT "6 Clicks" Daily Activity     ?Outcome Measure ? ? Help from another person eating meals?: A Little ?Help from another person taking care of personal grooming?: A Little ?Help from another person toileting, which includes using toliet, bedpan, or urinal?: A Lot ?Help from another person bathing (including washing, rinsing, drying)?: A Lot ?Help from another person to put on and taking off regular upper body clothing?: A Little ?Help from another person to put on and taking off regular lower body clothing?: A Lot ?6 Click Score: 15 ? ?  ?End of Session Equipment Utilized During Treatment: Rolling walker (2 wheels);Gait belt ? ?OT Visit Diagnosis: Unsteadiness on feet (R26.81) ?  ?Activity Tolerance Patient tolerated treatment well ?  ?Patient Left in chair;with call bell/phone within reach;with chair alarm set ?  ?Nurse Communication Mobility status ?  ? ?   ? ?Time: [id number] ?OT Time Calculation (min): 29 min ? ?Charges: OT General Charges ?$OT Visit: 1 Visit ?OT Treatments ?$Self Care/Home Management : 23-37 mins ? ?Fredirick Maudlin, OTR/L ?[place] ? ?

## 2021-07-01 NOTE — Assessment & Plan Note (Signed)
Noted to be somewhat confused over the last few days.  Initially thought to be due to narcotics which were discontinued.  Mentation has improved but not back to baseline.  CT head was done this morning which did not show any acute findings.  Atrophy was noted. ?Patient does not have any focal neurological deficits.  We will stop his Neurontin.  Reassess mental status tomorrow. ?

## 2021-07-01 NOTE — Progress Notes (Addendum)
?   06/27/21 0742  ?Assess: MEWS Score  ?Temp 98.6 ?F (37 ?C)  ?BP (!) 144/97  ?Pulse Rate (!) 128  ?Resp 20  ?SpO2 100 %  ?O2 Device Nasal Cannula  ?O2 Flow Rate (L/min) 2 L/min  ?Assess: MEWS Score  ?MEWS Temp 0  ?MEWS Systolic 0  ?MEWS Pulse 2  ?MEWS RR 0  ?MEWS LOC 0  ?MEWS Score 2  ?MEWS Score Color Yellow  ?Assess: if the MEWS score is Yellow or Red  ?Were vital signs taken at a resting state? Yes  ?Focused Assessment No change from prior assessment  ?Does the patient meet 2 or more of the SIRS criteria? No  ?Does the patient have a confirmed or suspected source of infection? Yes  ?Provider and Rapid Response Notified? Yes  ?MEWS guidelines implemented *See Row Information* Yes  ?Treat  ?MEWS Interventions Administered prn meds/treatments;Administered scheduled meds/treatments  ?Pain Scale 0-10  ?Pain Score 0  ?Take Vital Signs  ?Increase Vital Sign Frequency  Yellow: Q 2hr X 2 then Q 4hr X 2, if remains yellow, continue Q 4hrs  ?Escalate  ?MEWS: Escalate Yellow: discuss with charge nurse/RN and consider discussing with provider and RRT  ?Notify: Charge Nurse/RN  ?Name of Charge Nurse/RN Notified Amanda, RN  ?Date Charge Nurse/RN Notified 06/27/21  ?Time Charge Nurse/RN Notified 0750  ?Notify: Provider  ?Provider Name/Title Dr. Krishnan  ?Date Provider Notified 06/27/21  ?Time Provider Notified 0742  ?Notification Type Face-to-face ?(secure chat)  ?Notification Reason Other (Comment) ?(Elevated HR)  ?Provider response See new orders  ?Date of Provider Response 06/27/21  ?Time of Provider Response [id number]  ?Document  ?Patient Outcome Stabilized after interventions  ?Progress note created (see row info) Yes  ?Assess: SIRS CRITERIA  ?SIRS Temperature  0  ?SIRS Pulse 1  ?SIRS Respirations  0  ?SIRS WBC 0  ?SIRS Score Sum  1  ? ? ?

## 2021-07-01 NOTE — TOC Progression Note (Signed)
Transition of Care (TOC) - Progression Note  ? ? ?Patient Details  ?Name: Samuel Thomas ?MRN: [id number] ?Date of Birth: Sep 12, 1935 ? ?Transition of Care (TOC) CM/SW Contact  ?Thimothy Barretta A Brookie Wayment, LCSW ?Phone Number: ?07/01/2021, 10:52 AM ? ?Clinical Narrative:   Pt is a long term care resident at [facility]. Pt's Lucila Maine is active in pt's life/care. Pt's Grandson states the plan is for pt to return to [facility] at d/c. However, Pt's Lucila Maine is concerned regarding mentation change in pt as well as why pt has not gotten therapy until yesterday. Pt's Lucila Maine states he saw a market change in pt's cognition once pt was given additional pain medication. Pt's Grandson wants to be sure pt mentation is back to baseline before he returns to SNF.  ? ? ? ?  ?  ? ?Expected Discharge Plan and Services ?  ?  ?  ?  ?  ?                ?  ?  ?  ?  ?  ?  ?  ?  ?  ?  ? ? ?Social Determinants of Health (SDOH) Interventions ?  ? ?Readmission Risk Interventions ?Readmission Risk Prevention Plan 02/08/2021  ?Transportation Screening Complete  ?Medication Review Oceanographer) Complete  ?PCP or Specialist appointment within 3-5 days of discharge Complete  ?HRI or Home Care Consult Complete  ?SW Recovery Care/Counseling Consult Complete  ?Palliative Care Screening Not Applicable  ?Skilled Nursing Facility Complete  ?Some recent data might be hidden  ? ? ?

## 2021-07-01 NOTE — Progress Notes (Signed)
PT Cancellation Note ? ?Patient Details ?Name: Samuel Thomas ?MRN: [id number] ?DOB: [birth year] ? ? ?Cancelled Treatment:    Reason Eval/Treat Not Completed: Other (comment).  Chart reviewed and attempted to see pt.  Pt upright in chair sleeping.  Attempted to wake pt, but pt fatigued following mobility and OT sessions prior in day.  Will re-attempt at later day as medically appropriate.   ? ? ? ?Nolon Bussing, PT, DPT ?07/01/21, 4:16 PM ? ?

## 2021-07-01 NOTE — Progress Notes (Addendum)
Mobility Specialist - Progress Note ? ? 07/01/21 1100  ?Mobility  ?Activity Ambulated with assistance in hallway;Stood at bedside;Dangled on edge of bed  ?Range of Motion/Exercises Active  ?Level of Assistance Contact guard assist, steadying assist  ?Assistive Device Front wheel walker  ?Distance Ambulated (ft) 145 ft  ?Activity Response Tolerated well  ?$Mobility charge 1 Mobility  ? ? ? ?Pre-mobility: 87 HR ?During mobility: 112 HR ?Post-mobility: 89 HR ? ? ?Pt lying in bed upon arrival, utilizing RA. Pt sat EOB with minA and participated in ADLs prior to ambulation (washed face, brushed teeth, and bath) with set-up assist. Cues for initiating some tasks like transferring supine<>sit. Extra time to stand. Slow gait. Ambulated in hallway with cues to keep RW close and within BOS, especially during turns. 1 extensive seated rest break taken. HR ranging 80s-110s during activity, RR 23. Lateral steps towards [facility] with assist to navigate RW with side-stepping. Pt returned supine with alarm set, needs in reach.  ? ? ?Kathee Delton ?Mobility Specialist ?07/01/21, 11:36 AM ? ?

## 2021-07-01 NOTE — Progress Notes (Signed)
? ?[facility] ?PROGRESS NOTE ? ? LEELYNN WHETSEL HDQ:[id number] DOB: 1935-11-20 DOA: 06/25/2021  6 ?DOS: the patient was seen and examined on 07/01/2021 ? ?PCP: Jenell Milliner, MD ? ?Brief History and Hospital Course:  ?86 y.o. male with medical history significant for  DM, HTN, diastolic heart failure, paroxysmal A. fib on Xarelto and Tikosyn, thyroid nodules, iron deficiency anemia, frequent falls, COPD not currently on home oxygen, chronic opioid use, hospitalized for pneumonia in October and November 2022, strep antigen positive in November, who presented to the ED with a 3 to 4-day history of generalized weakness and shortness of breath.  Patient was noted to be tachycardic on arrival to the emergency department.  CT angiogram of the chest raise concern for multifocal pneumonia.  COVID and influenza PCR's were negative.  Patient was hospitalized for further management.  Respiratory status has improved.  Cardizem dose had to be increased and metoprolol was added.  Heart rate seems to be better controlled now.  Has been somewhat lethargic and more confused than usual the last 48 hours. ? ?Consultants: None ? ?Procedures: None ? ? ? ?Subjective: ?Somnolent this morning but easily arousable.  Does not complain of any pain issues this morning.  Somewhat distracted.   ? ? ?Assessment/Plan: ? ? ?* Multifocal pneumonia ?Sepsis present on admission ? ?CT angiogram chest showed multifocal pneumonia.  Patient with previous history of pneumonia. Tachycardia and tachypnea and source of infection without fever or leukocytosis. ?Previously seen by speech therapy and was placed on dysphagia diet but patient apparently does not like the thickened liquids.  Seen by speech therapy again. ?Patient placed on vancomycin and cefepime.  Cultures were negative. MRSA PCR negative.  Vancomycin was discontinued.  Cefepime changed over to [facility].  Continue Omnicef. ?Remains afebrile.  WBC is normal. ?Respiratory status is stable.   Seems to be off of oxygen this morning.  Continue to monitor. ?Considering that he has had recurrent hospitalization for similar issues he will benefit from palliative care evaluation for goals of care.  This can be done at a skilled nursing facility. ? ?Acute metabolic encephalopathy ?Noted to be somewhat confused over the last few days.  Initially thought to be due to narcotics which were discontinued.  Mentation has improved but not back to baseline.  CT head was done this morning which did not show any acute findings.  Atrophy was noted. ?Patient does not have any focal neurological deficits.  We will stop his Neurontin.  Reassess mental status tomorrow. ? ?Acute respiratory failure with hypoxia (HCC) ?Does not use oxygen at baseline.  Was placed on oxygen at the skilled nursing facility.  Has been on and off of oxygen here in the hospital.  Will need to assess home oxygen requirements closer to discharge. ? ?Thrombocytopenia (HCC) ?Counts were 128,000, Down from over 300,000 about 4 months.  Likely due to acute illness. Platelet counts improved.   ? ?Generalized weakness ?Secondary to acute illness.  Physical therapy evaluation.  Already is at a skilled nursing facility. ? ?AKI (acute kidney injury) (HCC) ?Creatinine was noted to be 1.55 on presentation.  Baseline 0.73.  Improved with IV fluids.  Monitor urine output. ? ?Paroxysmal atrial fibrillation with RVR (HCC) ?Known history of atrial fibrillation.  Continue diltiazem and dofetilide.  Continue Xarelto.  Tachycardia at the time of admission most likely due to dehydration/infection.   ?Atrial fibrillation has been poorly controlled during this hospital stay.  Dose of Cardizem was increased.  Metoprolol was added.   ?  Heart rate has improved.  Continue current treatment for now. ?Continue dofetilide.  Monitor electrolytes periodically. replace potassium today. ? ?Chronic anticoagulation ?Continue Xarelto. ? ?Chronic diastolic heart failure (HCC) ?Hypovolemia  noted at admission. Echo from 10/29 with EF 55 to 60% ?Patient not on diuretics or beta-blockers or ACE/ARB ?IV fluids subsequently discontinued. ? ?Chronic disease anemia ?Stable.  No evidence of overt bleeding.  Continue to monitor. ? ?Essential hypertension ?Blood pressure reasonably well controlled. ? ?COPD (chronic obstructive pulmonary disease) (HCC) ?Stable.  Continue home medications. ? ?DM2 (diabetes mellitus, type 2) (HCC) ?HbA1c 7.1 in October.  Patient on Lantus prior to admission.  Was placed on hold.  CBGs were poorly controlled.  Lantus was reinitiated.  CBGs are better now.  Continue to monitor. ? ?Chronic pain syndrome ?Has chronic back pain.  Not noted to be on any narcotics prior to admission based on the medication list.  Was given narcotics here which resulted in confusion.  Narcotics were subsequently discontinued. ? ? ? ?DVT Prophylaxis: On Xarelto ?Code Status: DNR ?Family Communication: Lucila MaineGrandson being updated every few days. ?Disposition Plan: Back to SNF when mentation has improved. ? ?Status is: Inpatient ?Remains inpatient appropriate because: Multifocal pneumonia, acute respiratory failure with hypoxia ? ? ? ? ?Medications: Scheduled: ? atorvastatin  20 mg Oral QHS  ? budesonide  0.5 mg Inhalation Daily  ? cefdinir  300 mg Oral Q12H  ? diltiazem  300 mg Oral Daily  ? dofetilide  125 mcg Oral BID  ? finasteride  5 mg Oral Daily  ? insulin aspart  0-15 Units Subcutaneous TID WC  ? insulin aspart  0-5 Units Subcutaneous QHS  ? insulin glargine-yfgn  15 Units Subcutaneous Daily  ? loratadine  10 mg Oral Daily  ? metoprolol tartrate  25 mg Oral BID  ? pantoprazole  40 mg Oral Daily  ? potassium chloride  20 mEq Oral BID  ? rivaroxaban  15 mg Oral Q supper  ? tamsulosin  0.4 mg Oral Daily  ? tiotropium  18 mcg Inhalation Daily  ? ?Continuous: ? ? ?ZOX:[id number]:acetaminophen **OR** acetaminophen, albuterol, metoprolol tartrate, ondansetron **OR** ondansetron (ZOFRAN) IV, polyethylene  glycol ? ?Antibiotics: ?Anti-infectives (From admission, onward)  ? ? Start     Dose/Rate Route Frequency Ordered Stop  ? 06/28/21 1115  cefdinir (OMNICEF) capsule 300 mg       ? 300 mg Oral Every 12 hours 06/28/21 1016    ? 06/28/21 0300  vancomycin (VANCOREADY) IVPB 1750 mg/350 mL  Status:  Discontinued       ? 1,750 mg ?175 mL/hr over 120 Minutes Intravenous Every 48 hours 06/26/21 0110 06/27/21 1047  ? 06/26/21 1200  ceFEPIme (MAXIPIME) 2 g in sodium chloride 0.9 % 100 mL IVPB  Status:  Discontinued       ? 2 g ?200 mL/hr over 30 Minutes Intravenous Every 12 hours 06/26/21 0102 06/28/21 1016  ? 06/26/21 0300  vancomycin (VANCOREADY) IVPB 1750 mg/350 mL       ? 1,750 mg ?175 mL/hr over 120 Minutes Intravenous  Once 06/26/21 0301 06/26/21 0518  ? 06/26/21 0030  vancomycin (VANCOREADY) IVPB 1750 mg/350 mL  Status:  Discontinued       ? 1,750 mg ?175 mL/hr over 120 Minutes Intravenous  Once 06/26/21 0021 06/26/21 0301  ? 06/26/21 0015  cefTRIAXone (ROCEPHIN) 2 g in sodium chloride 0.9 % 100 mL IVPB  Status:  Discontinued       ? 2 g ?200 mL/hr over 30 Minutes Intravenous  Every 24 hours 06/26/21 0009 06/26/21 0016  ? 06/26/21 0015  azithromycin (ZITHROMAX) 500 mg in sodium chloride 0.9 % 250 mL IVPB  Status:  Discontinued       ? 500 mg ?250 mL/hr over 60 Minutes Intravenous Every 24 hours 06/26/21 0009 06/26/21 0016  ? 06/25/21 2330  ceFEPIme (MAXIPIME) 2 g in sodium chloride 0.9 % 100 mL IVPB       ? 2 g ?200 mL/hr over 30 Minutes Intravenous  Once 06/25/21 2315 06/26/21 0100  ? 06/25/21 2330  vancomycin (VANCOCIN) IVPB 1000 mg/200 mL premix  Status:  Discontinued       ? 1,000 mg ?200 mL/hr over 60 Minutes Intravenous  Once 06/25/21 2315 06/26/21 0021  ? ?  ? ? ?Objective: ? ?Vital Signs ? ?Vitals:  ? 06/30/21 2242 07/01/21 0327 07/01/21 8413 07/01/21 0747  ?BP: (!) 125/96 138/83  (!) 134/100  ?Pulse: 85 94  (!) 110  ?Resp: 18 18  16   ?Temp: 99.1 ?F (37.3 ?C) 97.6 ?F (36.4 ?C)  98 ?F (36.7 ?C)  ?TempSrc: Oral  Axillary    ?SpO2: 96% 97% 92% 92%  ?Weight:      ?Height:      ? ? ?Intake/Output Summary (Last 24 hours) at 07/01/2021 1009 ?Last data filed at 07/01/2021 0630 ?Gross per 24 hour  ?Intake 120 ml  ?Output 400 ml  ?Net -280 ml

## 2021-07-01 NOTE — Progress Notes (Signed)
?   06/28/21 1955  ?Assess: MEWS Score  ?Temp 98.4 ?F (36.9 ?C)  ?BP 127/65  ?Pulse Rate (!) 118  ?ECG Heart Rate (!) 115  ?Resp 18  ?Level of Consciousness Alert  ?SpO2 (!) 83 %  ?O2 Device Room Air  ?O2 Flow Rate (L/min) 0 L/min  ?Assess: MEWS Score  ?MEWS Temp 0  ?MEWS Systolic 0  ?MEWS Pulse 2  ?MEWS RR 0  ?MEWS LOC 0  ?MEWS Score 2  ?MEWS Score Color Yellow  ?Assess: if the MEWS score is Yellow or Red  ?Were vital signs taken at a resting state? Yes  ?Focused Assessment Change from prior assessment (see assessment flowsheet)  ?Does the patient meet 2 or more of the SIRS criteria? No  ?Does the patient have a confirmed or suspected source of infection? Yes  ?Provider and Rapid Response Notified? Yes  ?MEWS guidelines implemented *See Row Information* Yes  ?Treat  ?MEWS Interventions Administered scheduled meds/treatments  ?Pain Scale 0-10  ?Pain Score 7  ?Pain Type Chronic pain  ?Pain Location Back  ?Pain Orientation Lower  ?Pain Descriptors / Indicators Aching;Sore  ?Pain Frequency Constant  ?Pain Onset On-going  ?Patients Stated Pain Goal 2  ?Pain Intervention(s) Medication (See eMAR)  ?Take Vital Signs  ?Increase Vital Sign Frequency  Yellow: Q 2hr X 2 then Q 4hr X 2, if remains yellow, continue Q 4hrs  ?Escalate  ?MEWS: Escalate Yellow: discuss with charge nurse/RN and consider discussing with provider and RRT  ?Notify: Charge Nurse/RN  ?Name of Charge Nurse/RN Notified Jackie RN  ?Date Charge Nurse/RN Notified 06/28/21  ?Time Charge Nurse/RN Notified 2020  ?Notify: Provider  ?Provider Name/Title Brenda Morris NP  ?Date Provider Notified 06/28/21  ?Time Provider Notified 2021  ?Notification Type Face-to-face  ?Notification Reason Other (Comment) ?(Yellow MEWS)  ?Provider response In department  ?Date of Provider Response 06/28/21  ?Time of Provider Response 2022  ?Document  ?Patient Outcome Stabilized after interventions  ?Progress note created (see row info) Yes  ?Assess: SIRS CRITERIA  ?SIRS Temperature  0   ?SIRS Pulse 1  ?SIRS Respirations  0  ?SIRS WBC 0  ?SIRS Score Sum  1  ? ? ?

## 2021-07-02 LAB — URINALYSIS, ROUTINE W REFLEX MICROSCOPIC
Bacteria, UA: NONE SEEN
Bilirubin Urine: NEGATIVE
Glucose, UA: NEGATIVE mg/dL
Hgb urine dipstick: NEGATIVE
Ketones, ur: 5 mg/dL — AB
Leukocytes,Ua: NEGATIVE
Nitrite: NEGATIVE
Protein, ur: 30 mg/dL — AB
Specific Gravity, Urine: 1.02 (ref 1.005–1.030)
pH: 5 (ref 5.0–8.0)

## 2021-07-02 LAB — GLUCOSE, CAPILLARY
Glucose-Capillary: 146 mg/dL — ABNORMAL HIGH (ref 70–99)
Glucose-Capillary: 241 mg/dL — ABNORMAL HIGH (ref 70–99)
Glucose-Capillary: 193 mg/dL — ABNORMAL HIGH (ref 70–99)
Glucose-Capillary: 185 mg/dL — ABNORMAL HIGH (ref 70–99)

## 2021-07-02 LAB — BASIC METABOLIC PANEL
Anion gap: 5 (ref 5–15)
BUN: 14 mg/dL (ref 8–23)
CO2: 28 mmol/L (ref 22–32)
Calcium: 8.7 mg/dL — ABNORMAL LOW (ref 8.9–10.3)
Chloride: 108 mmol/L (ref 98–111)
Creatinine, Ser: 0.95 mg/dL (ref 0.61–1.24)
GFR, Estimated: >60 mL/min (ref >60)
Glucose, Bld: 160 mg/dL — ABNORMAL HIGH (ref 70–99)
Potassium: 3.8 mmol/L (ref 3.5–5.1)
Sodium: 141 mmol/L (ref 135–145)

## 2021-07-02 LAB — STREP PNEUMONIAE URINARY ANTIGEN: Strep Pneumo Urinary Antigen: NEGATIVE

## 2021-07-02 LAB — MAGNESIUM: Magnesium: 1.9 mg/dL (ref 1.7–2.4)

## 2021-07-02 MED ORDER — POTASSIUM CHLORIDE 20 MEQ PO PACK
20.0000 meq | PACK | Freq: Once | ORAL | Status: AC
  Administered 2021-07-02: 20 meq via ORAL
  Filled 2021-07-02: qty 1

## 2021-07-02 MED ORDER — POTASSIUM CHLORIDE CRYS ER 20 MEQ PO TBCR
40.0000 meq | EXTENDED_RELEASE_TABLET | Freq: Once | ORAL | Status: AC
  Administered 2021-07-02: 40 meq via ORAL
  Filled 2021-07-02: qty 2

## 2021-07-02 MED ORDER — RIVAROXABAN 20 MG PO TABS
20.0000 mg | ORAL_TABLET | Freq: Every day | ORAL | Status: DC
  Administered 2021-07-02: 20 mg via ORAL
  Filled 2021-07-02 (×2): qty 1

## 2021-07-02 MED ORDER — MAGNESIUM SULFATE 2 GM/50ML IV SOLN
2.0000 g | Freq: Once | INTRAVENOUS | Status: AC
  Administered 2021-07-02: 2 g via INTRAVENOUS
  Filled 2021-07-02: qty 50

## 2021-07-02 NOTE — Progress Notes (Signed)
?PROGRESS NOTE ? ? ? TERRI MALERBA  MVH:[id number] DOB: 10-01-1935 DOA: 06/25/2021 ?PCP: Jenell Milliner, MD  ? ? ?Brief Narrative:  ?86 y.o. male with medical history significant for  DM, HTN, diastolic heart failure, paroxysmal A. fib on Xarelto and Tikosyn, thyroid nodules, iron deficiency anemia, frequent falls, COPD not currently on home oxygen, chronic opioid use, hospitalized for pneumonia in October and November 2022, strep antigen positive in November, who presented to the ED with a 3 to 4-day history of generalized weakness and shortness of breath.  Patient was noted to be tachycardic on arrival to the emergency department.  CT angiogram of the chest raise concern for multifocal pneumonia.  COVID and influenza PCR's were negative.  Patient was hospitalized for further management.  Respiratory status has improved.  Cardizem dose had to be increased and metoprolol was added.  Heart rate seems to be better controlled now.  Has been somewhat lethargic and more confused than usual the last 48 hours. ? ? ? ? ?Assessment & Plan: ?  ?Principal Problem: ?  Multifocal pneumonia ?Active Problems: ?  Acute respiratory failure with hypoxia (HCC) ?  Acute metabolic encephalopathy ?  AKI (acute kidney injury) (HCC) ?  Generalized weakness ?  Thrombocytopenia (HCC) ?  Chronic anticoagulation ?  Paroxysmal atrial fibrillation with RVR (HCC) ?  Chronic diastolic heart failure (HCC) ?  Essential hypertension ?  Chronic disease anemia ?  COPD (chronic obstructive pulmonary disease) (HCC) ?  DM2 (diabetes mellitus, type 2) (HCC) ?  Chronic pain syndrome ? ?Acute metabolic encephalopathy ?Noted to be somewhat confused over the last few days.  Initially thought to be due to narcotics which were discontinued.  Mentation has improved but not back to baseline.  CT head was done this morning which did not show any acute findings.  Atrophy was noted. ?Patient does not have any focal neurological deficits.   ?Home Neurontin was  discontinued ?Plan: ?Avoid narcotics ?Continue holding Neurontin ?Reassess mental status in a.m. ? ?* Multifocal pneumonia ?Sepsis present on admission ?CT angiogram chest showed multifocal pneumonia.  Patient with previous history of pneumonia. Tachycardia and tachypnea and source of infection without fever or leukocytosis. ?Previously seen by speech therapy and was placed on dysphagia diet but patient apparently does not like the thickened liquids.  Seen by speech therapy again. ?Patient placed on vancomycin and cefepime.  Cultures were negative. MRSA PCR negative.  Vancomycin was discontinued.  Cefepime changed over to [facility].  Continue Omnicef. ?Remains afebrile.  WBC is normal. ?Respiratory status is stable.  Seems to be off of oxygen this morning.  Continue to monitor. ?Considering that he has had recurrent hospitalization for similar issues he will benefit from palliative care evaluation for goals of care.  This can be done at a skilled nursing facility. ?  ? ?  ?Acute respiratory failure with hypoxia (HCC) ?Does not use oxygen at baseline.  Was placed on oxygen at the skilled nursing facility.  Has been on and off of oxygen here in the hospital.  Will need to assess home oxygen requirements closer to discharge. ?  ?Thrombocytopenia (HCC) ?Counts were 128,000, Down from over 300,000 about 4 months.  Likely due to acute illness. Platelet counts improved.   ?  ?Generalized weakness ?Secondary to acute illness.  Physical therapy evaluation.  Already is at a skilled nursing facility. ?  ?AKI (acute kidney injury) (HCC) ?Creatinine was noted to be 1.55 on presentation.  Baseline 0.73.  Improved with IV fluids.  Monitor urine  output. ?  ?Paroxysmal atrial fibrillation with RVR (HCC) ?Known history of atrial fibrillation.  Continue diltiazem and dofetilide.  Continue Xarelto.  Tachycardia at the time of admission most likely due to dehydration/infection.   ?Atrial fibrillation has been poorly controlled during this  hospital stay.  Dose of Cardizem was increased.  Metoprolol was added.   ?Heart rate has improved.  Continue current treatment for now. ?Continue dofetilide.  Monitor electrolytes periodically. replace potassium today. ?  ?Chronic anticoagulation ?Continue Xarelto. ?  ?Chronic diastolic heart failure (HCC) ?Hypovolemia noted at admission. Echo from 10/29 with EF 55 to 60% ?Patient not on diuretics or beta-blockers or ACE/ARB ?IV fluids subsequently discontinued. ?  ?Chronic disease anemia ?Stable.  No evidence of overt bleeding.  Continue to monitor. ?  ?Essential hypertension ?Blood pressure reasonably well controlled. ?  ?COPD (chronic obstructive pulmonary disease) (HCC) ?Stable.  Continue home medications. ?  ?DM2 (diabetes mellitus, type 2) (HCC) ?HbA1c 7.1 in October.  Patient on Lantus prior to admission.  Was placed on hold.  CBGs were poorly controlled.  Lantus was reinitiated.  CBGs are better now.  Continue to monitor. ?  ?Chronic pain syndrome ?Has chronic back pain.  Not noted to be on any narcotics prior to admission based on the medication list.  Was given narcotics here which resulted in confusion.  Narcotics were subsequently discontinued. ? ?DVT prophylaxis: Xarelto ?Code Status: DNR ?Family Communication:Grandson Onalee HuaDavid [phone] on 3/22 ?Disposition Plan: Status is: Inpatient ?Remains inpatient appropriate because: Acute metabolic encephalopathy, slowly improving.  Anticipate medical readiness for discharge 3/23 ?  ?Level of care: Telemetry Medical ? ?Consultants:  ?None ? ?Procedures:  ?None ? ?Antimicrobials: ?None ? ? ?Subjective: ?Seen and examined.  Sitting up in chair eating breakfast.  History limited by hearing loss but patient does answer questions appropriately.  Alert and oriented x2 ? ?Objective: ?Vitals:  ? 07/02/21 0541 07/02/21 0739 07/02/21 0755 07/02/21 1126  ?BP: 125/75  137/85 113/62  ?Pulse: 86  (!) 103 96  ?Resp: 20  16 16   ?Temp: 98.1 ?F (36.7 ?C)  97.9 ?F (36.6 ?C) 98 ?F  (36.7 ?C)  ?TempSrc:   Oral Oral  ?SpO2: 96% 98% 96% 98%  ?Weight:      ?Height:      ? ? ?Intake/Output Summary (Last 24 hours) at 07/02/2021 1257 ?Last data filed at 07/02/2021 1041 ?Gross per 24 hour  ?Intake 240 ml  ?Output 350 ml  ?Net -110 ml  ? ?Filed Weights  ? 06/25/21 1852  ?Weight: 75.8 kg  ? ? ?Examination: ? ?General exam: No acute distress ?Respiratory system: Lungs clear.  Normal work of breathing.  Room air ?Cardiovascular system: S1-S2, RRR, no murmurs, no pedal edema ?Gastrointestinal system: Soft, NT/ND, normal bowel sounds ?Central nervous system: Alert.  Oriented x2.  No focal deficits ?Extremities: Symmetric 5 x 5 power. ?Skin: No rashes, lesions or ulcers ?Psychiatry: Judgement and insight appear impaired. Mood & affect flattened.  ? ? ? ?Data Reviewed: I have personally reviewed following labs and imaging studies ? ?CBC: ?Recent Labs  ?Lab 06/25/21 ?1854 06/27/21 ?0428 06/28/21 ?[id number] 06/29/21 ?[id number] 06/30/21 ?[id number] 07/01/21 ?0306  ?WBC 4.8 3.5* 3.9* 4.3 4.2 3.9*  ?NEUTROABS 3.8  --   --   --   --   --   ?HGB 10.1* 9.5* 9.8* 10.2* 9.4* 9.7*  ?HCT 32.7* 30.5* 32.2* 34.2* 31.4* 31.7*  ?MCV 88.4 88.2 89.2 90.7 90.2 89.5  ?PLT 128* 125* 144* 157 190 225  ? ?Basic Metabolic Panel: ?  Recent Labs  ?Lab 06/27/21 ?0428 06/28/21 ?8101 06/29/21 ?7510 06/30/21 ?2585 07/01/21 ?2778 07/02/21 ?2423  ?NA 138 140 140 144 143 141  ?K 3.6 3.9 4.8 3.9 3.8 3.8  ?CL 104 107 108 111 107 108  ?CO2 27 25 25 25 28 28   ?GLUCOSE 197* 216* 267* 187* 203* 160*  ?BUN 24* 16 16 18 19 14   ?CREATININE 1.25* 1.23 1.27* 1.29* 1.15 0.95  ?CALCIUM 8.3* 8.3* 8.7* 8.7* 8.8* 8.7*  ?MG 1.8 2.1  --   --   --  1.9  ? ?GFR: ?Estimated Creatinine Clearance: 55.1 mL/min (by C-G formula based on SCr of 0.95 mg/dL). ?Liver Function Tests: ?Recent Labs  ?Lab 06/25/21 ?1854  ?AST 22  ?ALT 15  ?ALKPHOS 60  ?BILITOT 0.3  ?PROT 6.2*  ?ALBUMIN 2.1*  ? ?No results for input(s): LIPASE, AMYLASE in the last 168 hours. ?No results for input(s): AMMONIA in  the last 168 hours. ?Coagulation Profile: ?No results for input(s): INR, PROTIME in the last 168 hours. ?Cardiac Enzymes: ?No results for input(s): CKTOTAL, CKMB, CKMBINDEX, TROPONINI in the last 168 hours.

## 2021-07-02 NOTE — Progress Notes (Signed)
Occupational Therapy Treatment ?Patient Details ?Name: Samuel Thomas ?MRN: [id number] ?DOB: 19-Aug-1935 ?Today's Date: 07/02/2021 ? ? ?History of present illness 86 y.o. male with medical history significant for  DM, HTN, diastolic heart failure, paroxysmal A. fib on Xarelto and Tikosyn, thyroid nodules, iron deficiency anemia, frequent falls, COPD not currently on home oxygen, chronic opioid use, hospitalized for pneumonia in October and November 2022, strep antigen positive in November, who presented to the ED with a 3 to 4-day history of generalized weakness and shortness of breath. Pt admitted for multifocal pneumonia ?  ?OT comments ? Pt seen for OT treatment on this date. Upon arrival to room, pt awake and seated upright in recliner. Pt A&Ox2 and reporting no pain. Pt agreeable to OT tx. At start of session, pt reported urge for BM and required MIN GUARD for transfer to [facility]. Once seated, pt able to successfully void and engage in sit>stand posterior peri-care, requiring MIN A for thoroughness in setting of fatigue and elevated HR. Pt performed standing hand hygiene with MIN GUARD and seated UB dressing with SUPERVISION. Pt is making good progress toward goals. Pt continues to benefit from skilled OT services to maximize return to PLOF and minimize risk of future falls, injury, caregiver burden, and readmission. Will continue to follow POC. Discharge recommendation remains appropriate.   ? ?Note: pt with resting HR 120s at start of session, increasing to 130s-140s (MAX 154) during toilet transfer/hygiene. At end of session, HR returning to resting rates. RN informed  ? ?Recommendations for follow up therapy are one component of a multi-disciplinary discharge planning process, led by the attending physician.  Recommendations may be updated based on patient status, additional functional criteria and insurance authorization. ?   ?Follow Up Recommendations ? Skilled nursing-short term rehab (<3 hours/day)  ?   ?Assistance Recommended at Discharge Frequent or constant Supervision/Assistance  ?Patient can return home with the following ? A little help with walking and/or transfers;A lot of help with bathing/dressing/bathroom ?  ?Equipment Recommendations ? Other (comment) (defer to next venue of care)  ?  ?   ?Precautions / Restrictions Precautions ?Precautions: Fall ?Precaution Comments: Tachy ?Restrictions ?Weight Bearing Restrictions: No  ? ? ?  ? ?Mobility Bed Mobility ?  ?  ?  ?  ?  ?  ?  ?General bed mobility comments: not assessed, in recliner pre/post session ?  ? ?Transfers ?Overall transfer level: Needs assistance ?Equipment used: Rolling walker (2 wheels) ?Transfers: Sit to/from Stand ?Sit to Stand: Min guard ?  ?  ?  ?  ?  ?General transfer comment: VC's for hand placement ?  ?  ?Balance Overall balance assessment: Needs assistance ?Sitting-balance support: No upper extremity supported, Feet supported ?Sitting balance-Leahy Scale: Good ?  ?  ?Standing balance support: No upper extremity supported, During functional activity ?Standing balance-Leahy Scale: Fair ?Standing balance comment: required MIN GUARD during standing grooming tasks ?  ?  ?  ?  ?  ?  ?  ?  ?  ?  ?  ?   ? ?ADL either performed or assessed with clinical judgement  ? ?ADL Overall ADL's : Needs assistance/impaired ?  ?  ?Grooming: Wash/dry hands;Min guard;Standing ?  ?  ?  ?  ?  ?Upper Body Dressing : Supervision/safety;Sitting ?Upper Body Dressing Details (indicate cue type and reason): to don/doff hospital gown ?  ?  ?Toilet Transfer: Min guard;Ambulation;BSC/3in1;Rolling walker (2 wheels) ?Toilet Transfer Details (indicate cue type and reason): Requires verbal cues for UE placement ?  Toileting- Clothing Manipulation and Hygiene: Minimal assistance;Sit to/from stand ?Toileting - Clothing Manipulation Details (indicate cue type and reason): Requires MIN A to ensure thoroughness in setting of fatigue ?  ?  ?  ?  ?  ? ? ? ?Cognition  Arousal/Alertness: Awake/alert ?Behavior During Therapy: [facility] for tasks assessed/performed ?Overall Cognitive Status: No family/caregiver present to determine baseline cognitive functioning ?  ?  ?  ?  ?  ?  ?  ?  ?  ?  ?  ?  ?  ?  ?  ?  ?General Comments: Pt A&Ox2. Requires increased processing time and verbal cues for sequencing and safety awareness ?  ?  ?   ?   ?   ?General Comments Pt with resting HR 120s at start of session, increasing to 130s-140s (MAX 154) during toilet transfer/hygiene. At end of session, HR returning to resting rates. RN informed  ? ? ?Pertinent Vitals/ Pain       Pain Assessment ?Pain Assessment: No/denies pain ? ?   ?   ? ?Frequency ? Min 2X/week  ? ? ? ? ?  ?Progress Toward Goals ? ?OT Goals(current goals can now be found in the care plan section) ? Progress towards OT goals: Progressing toward goals ? ?Acute Rehab OT Goals ?OT Goal Formulation: Patient unable to participate in goal setting ?Time For Goal Achievement: 07/14/21 ?Potential to Achieve Goals: Good  ?Plan Discharge plan remains appropriate;Frequency remains appropriate   ? ?   ?AM-PAC OT "6 Clicks" Daily Activity     ?Outcome Measure ? ? Help from another person eating meals?: A Little ?Help from another person taking care of personal grooming?: A Little ?Help from another person toileting, which includes using toliet, bedpan, or urinal?: A Little ?Help from another person bathing (including washing, rinsing, drying)?: A Lot ?Help from another person to put on and taking off regular upper body clothing?: A Little ?Help from another person to put on and taking off regular lower body clothing?: A Lot ?6 Click Score: 16 ? ?  ?End of Session Equipment Utilized During Treatment: Rolling walker (2 wheels) ? ?OT Visit Diagnosis: Unsteadiness on feet (R26.81) ?  ?Activity Tolerance Patient tolerated treatment well ?  ?Patient Left in chair;with call bell/phone within reach;with chair alarm set ?  ?Nurse Communication Mobility  status ?  ? ?   ? ?Time: 4034-7425 ?OT Time Calculation (min): 29 min ? ?Charges: OT General Charges ?$OT Visit: 1 Visit ?OT Treatments ?$Self Care/Home Management : 23-37 mins ? ?Matthew Folks, OTR/L ?ASCOM [phone] ? ?

## 2021-07-02 NOTE — Consult Note (Addendum)
PHARMACY CONSULT NOTE - FOLLOW UP ? ?Pharmacy Consult for Electrolyte Monitoring and Replacement  ? ?Recent Labs: ?Potassium (mmol/L)  ?Date Value  ?07/02/2021 3.8  ? ?Magnesium (mg/dL)  ?Date Value  ?07/02/2021 1.9  ? ?Calcium (mg/dL)  ?Date Value  ?07/02/2021 8.7 (L)  ? ?Albumin (g/dL)  ?Date Value  ?06/25/2021 2.1 (L)  ? ?Phosphorus (mg/dL)  ?Date Value  ?02/10/2021 3.0  ? ?Sodium (mmol/L)  ?Date Value  ?07/02/2021 141  ? ? ?Assessment: ?Patient on Tikosyn PTA. Noted K remains under 4 after K replacement yesterday. MD agree on pharmacy consult to keep K at 4 or above. ? ?Medications: on dofetilide PO BID,  ?Home diuretics currently on hold ? ?Goal of Therapy:  ?K =/> 4 ?Mg >/= 2 (discussed with MD, added Mg repletion to consult goals) ? ?Plan:  ?Scr 1.29>1.15>0.95; UOP 0.2>0.9. ?K 3.8>3.8 after Kcl BID x 2 doses yesterday. ?Replace with x1 now; followed by in 4hours ?Mg: 2.1>1.9:  ?Replete with MgSO4 2g IV x1 ?Repeat K level in AM and replace as needed. ? ?Martyn Malay, PharmD, BCCP ?Clinical Pharmacist ?07/02/2021 8:12 AM ? ? ? ?

## 2021-07-02 NOTE — Progress Notes (Signed)
Physical Therapy Treatment ?Patient Details ?Name: Samuel Thomas ?MRN: [id number] ?DOB: 04-23-1935 ?Today's Date: 07/02/2021 ? ? ?History of Present Illness 86 y.o. male with medical history significant for  DM, HTN, diastolic heart failure, paroxysmal A. fib on Xarelto and Tikosyn, thyroid nodules, iron deficiency anemia, frequent falls, COPD not currently on home oxygen, chronic opioid use, hospitalized for pneumonia in October and November 2022, strep antigen positive in November, who presented to the ED with a 3 to 4-day history of generalized weakness and shortness of breath. Pt admitted for multifocal pneumonia ? ?  ?PT Comments  ? ? Patient received in recliner. He is sleepy, HOH, confused. He requires min guard for sit to stand with increased time needed. Patient is min guard for ambulation in hallway with RW. Cues to stay close to and inside RW needed for safety. Patient HR up to 118 during ambulation. He reports no sob or significant fatigue. Patient is quite confused at end of session when trying to order lunch. Required assistance from me to get task completed. Patient will continue to benefit from skilled PT while here to improve strength and functional independence for safety at discharge. Patient is long term care resident at [facility].   ?       ?Recommendations for follow up therapy are one component of a multi-disciplinary discharge planning process, led by the attending physician.  Recommendations may be updated based on patient status, additional functional criteria and insurance authorization. ? ?Follow Up Recommendations ? Home health PT ?  ?  ?Assistance Recommended at Discharge Frequent or constant Supervision/Assistance  ?Patient can return home with the following A little help with walking and/or transfers;Assistance with cooking/housework;Assist for transportation;Direct supervision/assist for financial management;Direct supervision/assist for medications management;Help with stairs or  ramp for entrance ?  ?Equipment Recommendations ? Rolling walker (2 wheels)  ?  ?Recommendations for Other Services   ? ? ?  ?Precautions / Restrictions Precautions ?Precautions: Fall ?Precaution Comments: Tachy ?Restrictions ?Weight Bearing Restrictions: No  ?  ? ?Mobility ? Bed Mobility ?  ?  ?  ?  ?  ?  ?  ?General bed mobility comments: not assessed, in recliner pre/post session ?  ? ?Transfers ?Overall transfer level: Needs assistance ?Equipment used: Rolling walker (2 wheels) ?Transfers: Sit to/from Stand ?Sit to Stand: Min guard ?  ?  ?  ?  ?  ?General transfer comment: VC's for hand placement ?  ? ?Ambulation/Gait ?Ambulation/Gait assistance: Min guard ?Gait Distance (Feet): 150 Feet ?Assistive device: Rolling walker (2 wheels) ?Gait Pattern/deviations: Step-through pattern, Decreased step length - right, Decreased step length - left, Trunk flexed ?Gait velocity: decreased ?  ?  ?General Gait Details: Pt amb at decreased cadence with step through pattern with good stability with RW. Maintains RW outside BOS anteriorly despite VC's ? ? ?Stairs ?  ?  ?  ?  ?  ? ? ?Wheelchair Mobility ?  ? ?Modified Rankin (Stroke Patients Only) ?  ? ? ?  ?Balance Overall balance assessment: Needs assistance ?Sitting-balance support: Feet supported ?Sitting balance-Leahy Scale: Good ?  ?  ?Standing balance support: Bilateral upper extremity supported, During functional activity, Reliant on assistive device for balance ?Standing balance-Leahy Scale: Fair ?  ?  ?  ?  ?  ?  ?  ?  ?  ?  ?  ?  ?  ? ?  ?Cognition Arousal/Alertness: Awake/alert ?Behavior During Therapy: [facility] for tasks assessed/performed, Flat affect ?Overall Cognitive Status: No family/caregiver present to determine baseline  cognitive functioning ?Area of Impairment: Orientation, Awareness, Problem solving ?  ?  ?  ?  ?  ?  ?  ?  ?Orientation Level: Disoriented to, Time, Situation ?  ?Memory: Decreased short-term memory ?  ?Safety/Judgement: Decreased awareness of  safety ?Awareness: Intellectual ?Problem Solving: Slow processing, Requires verbal cues, Requires tactile cues ?  ?  ?  ? ?  ?Exercises   ? ?  ?General Comments   ?  ?  ? ?Pertinent Vitals/Pain Pain Assessment ?Pain Assessment: No/denies pain ?Breathing: normal ?Negative Vocalization: none ?Facial Expression: smiling or inexpressive ?Body Language: relaxed ?Consolability: no need to console ?PAINAD Score: 0  ? ? ?Home Living   ?  ?  ?  ?  ?  ?  ?  ?  ?  ?   ?  ?Prior Function    ?  ?  ?   ? ?PT Goals (current goals can now be found in the care plan section) Acute Rehab PT Goals ?PT Goal Formulation: Patient unable to participate in goal setting ?Progress towards PT goals: Progressing toward goals ? ?  ?Frequency ? ? ? Min 2X/week ? ? ? ?  ?PT Plan Current plan remains appropriate  ? ? ?Co-evaluation   ?  ?  ?  ?  ? ?  ?AM-PAC PT "6 Clicks" Mobility   ?Outcome Measure ? Help needed turning from your back to your side while in a flat bed without using bedrails?: A Little ?Help needed moving from lying on your back to sitting on the side of a flat bed without using bedrails?: A Little ?Help needed moving to and from a bed to a chair (including a wheelchair)?: A Little ?Help needed standing up from a chair using your arms (e.g., wheelchair or bedside chair)?: A Little ?Help needed to walk in hospital room?: A Little ?Help needed climbing 3-5 steps with a railing? : A Lot ?6 Click Score: 17 ? ?  ?End of Session Equipment Utilized During Treatment: Gait belt ?Activity Tolerance: Patient tolerated treatment well ?Patient left: in chair;with chair alarm set ?Nurse Communication: Mobility status ?PT Visit Diagnosis: Muscle weakness (generalized) (M62.81);Difficulty in walking, not elsewhere classified (R26.2) ?  ? ? ?Time: 8182-9937 ?PT Time Calculation (min) (ACUTE ONLY): 24 min ? ?Charges:  $Gait Training: 23-37 mins          ?          ? ?Lissa Merlin, PT, GCS ?07/02/21,11:07 AM ? ? ?

## 2021-07-03 ENCOUNTER — Other Ambulatory Visit: Payer: Self-pay

## 2021-07-03 DIAGNOSIS — I4891 Unspecified atrial fibrillation: Secondary | ICD-10-CM

## 2021-07-03 LAB — BASIC METABOLIC PANEL
Anion gap: 7 (ref 5–15)
BUN: 15 mg/dL (ref 8–23)
CO2: 28 mmol/L (ref 22–32)
Calcium: 8.5 mg/dL — ABNORMAL LOW (ref 8.9–10.3)
Chloride: 106 mmol/L (ref 98–111)
Creatinine, Ser: 1.09 mg/dL (ref 0.61–1.24)
GFR, Estimated: >60 mL/min (ref >60)
Glucose, Bld: 165 mg/dL — ABNORMAL HIGH (ref 70–99)
Potassium: 4.3 mmol/L (ref 3.5–5.1)
Sodium: 141 mmol/L (ref 135–145)

## 2021-07-03 LAB — LEGIONELLA PNEUMOPHILA SEROGP 1 UR AG: L. pneumophila Serogp 1 Ur Ag: NEGATIVE

## 2021-07-03 LAB — GLUCOSE, CAPILLARY
Glucose-Capillary: 152 mg/dL — ABNORMAL HIGH (ref 70–99)
Glucose-Capillary: 176 mg/dL — ABNORMAL HIGH (ref 70–99)

## 2021-07-03 NOTE — Consult Note (Signed)
PHARMACY CONSULT NOTE - FOLLOW UP ? ?Pharmacy Consult for Electrolyte Monitoring and Replacement  ? ?Recent Labs: ?Potassium (mmol/L)  ?Date Value  ?07/03/2021 4.3  ? ?Magnesium (mg/dL)  ?Date Value  ?07/02/2021 1.9  ? ?Calcium (mg/dL)  ?Date Value  ?07/03/2021 8.5 (L)  ? ?Albumin (g/dL)  ?Date Value  ?06/25/2021 2.1 (L)  ? ?Phosphorus (mg/dL)  ?Date Value  ?02/10/2021 3.0  ? ?Sodium (mmol/L)  ?Date Value  ?07/03/2021 141  ? ? ?Assessment: ?Patient on Tikosyn PTA. Noted K remains under 4 after K replacement yesterday. MD agree on pharmacy consult to keep K at 4 or above. ? ?Medications: on dofetilide PO BID,  ?Home diuretics currently on hold ? ?Goal of Therapy:  ?K =/> 4 ?Mg >/= 2 (discussed with MD, added Mg repletion to consult goals) ? ?Plan:  ?Scr 0.95>1.09; UOP 0.2>0.9. ?K 3.8>4.3 after total of PO yesterday. ?Mg: 1.9>NNL: After repletion with MgSO4 2g IV x1 yesterday ?Repeat K level in AM and replace as needed. ? ?Martyn Malay, PharmD, BCCP ?Clinical Pharmacist ?07/03/2021 8:47 AM ? ? ? ?

## 2021-07-03 NOTE — Progress Notes (Addendum)
Occupational Therapy Treatment ?Patient Details ?Name: Samuel Thomas ?MRN: [id number] ?DOB: [birth year] ?Today's Date: 07/03/2021 ? ? ?History of present illness 86 y.o. male with medical history significant for  DM, HTN, diastolic heart failure, paroxysmal A. fib on Xarelto and Tikosyn, thyroid nodules, iron deficiency anemia, frequent falls, COPD not currently on home oxygen, chronic opioid use, hospitalized for pneumonia in October and November 2022, strep antigen positive in November, who presented to the ED with a 3 to 4-day history of generalized weakness and shortness of breath. Pt admitted for multifocal pneumonia ?  ?OT comments ? Pt. education was provided about energy conservation techniques, and work simplification strategies during ADLs, and IADL tasks while reviewing pt.'s daily routines. Pt.'s SO2 93%, and HR 99 bpms. Pt. continues to benefit from OT services for ADL training, A/E training, and pt. Education about energy conservation, and work simplification strategies. Pt. Continues to be appropriate for SNF level of care upon discharge, with follow-up OT services.   ? ?Recommendations for follow up therapy are one component of a multi-disciplinary discharge planning process, led by the attending physician.  Recommendations may be updated based on patient status, additional functional criteria and insurance authorization. ?   ?Follow Up Recommendations ? Skilled nursing-short term rehab (<3 hours/day)  ?  ?Assistance Recommended at Discharge Frequent or constant Supervision/Assistance  ?Patient can return home with the following ? A little help with walking and/or transfers;A lot of help with bathing/dressing/bathroom ?  ?Equipment Recommendations ?    ?  ?Recommendations for Other Services   ? ?  ?Precautions / Restrictions Precautions ?Precautions: Fall ?Restrictions ?Weight Bearing Restrictions: No  ? ? ?  ? ?Mobility Bed Mobility ?  ?Bed Mobility: Supine to Sit ?  ?  ?Supine to sit: HOB elevated, Min  assist ?  ?  ?  ?  ? ?Transfers ?  ?Equipment used: Rolling walker (2 wheels) ?Transfers: Sit to/from Stand ?Sit to Stand: Min guard ?  ?  ?  ?  ?  ?General transfer comment: Per chart ?  ?  ?Balance   ?  ?  ?  ?  ?  ?  ?  ?  ?  ?  ?  ?  ?  ?  ?  ?  ?  ?  ?   ? ?ADL either performed or assessed with clinical judgement  ? ?ADL   ?  ?  ?  ?  ?  ?  ?  ?  ?  ?  ?  ?  ?  ?  ?  ?  ?  ?  ?  ?General ADL Comments: Reviewed Energy conservation strategies, and PLB strategies for ADLs. ?  ? ?Extremity/Trunk Assessment Upper Extremity Assessment ?Upper Extremity Assessment: Generalized weakness ?  ?  ?  ?  ?  ? ?Vision Patient Visual Report: No change from baseline ?  ?  ?Perception   ?  ?Praxis   ?  ? ?Cognition Arousal/Alertness: Awake/alert ?Behavior During Therapy: [facility] for tasks assessed/performed ?Overall Cognitive Status: No family/caregiver present to determine baseline cognitive functioning ?Area of Impairment: Orientation, Awareness, Problem solving ?  ?  ?  ?  ?  ?  ?  ?  ?Orientation Level: Disoriented to, Time, Situation ?  ?Memory: Decreased short-term memory ?  ?Safety/Judgement: Decreased awareness of safety ?Awareness: Intellectual ?Problem Solving: Slow processing, Requires verbal cues, Requires tactile cues ?General Comments: Pt A&Ox2. Requires increased processing time and verbal cues for sequencing and safety awareness ?  ?  ?   ?  Exercises   ? ?  ?Shoulder Instructions   ? ? ?  ?General Comments    ? ? ?Pertinent Vitals/ Pain       Pain Assessment ?Pain Assessment: No/denies pain ? ?Home Living   ?  ?  ?  ?  ?  ?  ?  ?  ?  ?  ?  ?  ?  ?  ?  ?  ?  ?  ? ?  ?Prior Functioning/Environment    ?  ?  ?  ?   ? ?Frequency ? Min 2X/week  ? ? ? ? ?  ?Progress Toward Goals ? ?OT Goals(current goals can now be found in the care plan section) ? Progress towards OT goals: Progressing toward goals ? ?Acute Rehab OT Goals ?OT Goal Formulation: Patient unable to participate in goal setting ?Time For Goal Achievement:  07/14/21 ?Potential to Achieve Goals: Good  ?Plan Discharge plan remains appropriate;Frequency remains appropriate   ? ?Co-evaluation ? ? ?   ?  ?  ?  ?  ? ?  ?AM-PAC OT "6 Clicks" Daily Activity     ?Outcome Measure ? ? Help from another person eating meals?: A Little ?Help from another person taking care of personal grooming?: A Little ?Help from another person toileting, which includes using toliet, bedpan, or urinal?: A Little ?Help from another person bathing (including washing, rinsing, drying)?: A Lot ?Help from another person to put on and taking off regular upper body clothing?: A Little ?Help from another person to put on and taking off regular lower body clothing?: A Lot ?6 Click Score: 16 ? ?  ?End of Session Equipment Utilized During Treatment: Rolling walker (2 wheels) ? ?OT Visit Diagnosis: Unsteadiness on feet (R26.81) ?  ?Activity Tolerance Patient tolerated treatment well ?  ?Patient Left in chair;with call bell/phone within reach;with chair alarm set ?  ?Nurse Communication Mobility status ?  ? ?   ? ?Time: [id number] ?OT Time Calculation (min): 22 min ? ?Charges: OT General Charges ?$OT Visit: 1 Visit ?OT Treatments ?$Self Care/Home Management : 8-22 mins ? ?Harrel Carina, MS, OTR/L  ? ?Harrel Carina ?07/03/2021, 4:31 PM ?

## 2021-07-03 NOTE — Care Management Important Message (Signed)
Important Message ? ?Patient Details  ?Name: Samuel Thomas ?MRN: [id number] ?Date of Birth: 03-15-1936 ? ? ?Medicare Important Message Given:  Yes ? ?I reviewed the Important Message from Medicare with the patients Samuel Thomas by phone [phone]) and he is in agreement with the discharge. I asked if he would like a copy and he declined.  I thanked him for his time. ? ? ?Olegario Messier A Kimyah Frein ?07/03/2021, 10:02 AM ?

## 2021-07-03 NOTE — TOC Transition Note (Signed)
Transition of Care (TOC) - CM/SW Discharge Note ? ? ?Patient Details  ?Name: Samuel Thomas ?MRN: [id number] ?Date of Birth: [birth year] ? ?Transition of Care (TOC) CM/SW Contact:  ?Lailany Enoch A Patti Shorb, LCSW ?Phone Number: ?07/03/2021, 1:34 PM ? ? ?Clinical Narrative:   Clinical Social Worker facilitated patient discharge including contacting patient family and facility to confirm patient discharge plans.  Clinical information faxed to facility and family agreeable with plan.  CSW arranged ambulance transport via [facility] to [facility] room [phone] .  RN to call [phone] for report prior to discharge. ? ? ? ? ? ? ?Final next level of care: [place] ?Barriers to Discharge: No Barriers Identified ? ? ?Patient Goals and CMS Choice ?  ?  ?  ? ?Discharge Placement ?  ?           ?Patient chooses bed at:  ([facility]) ?Patient to be transferred to facility by: [facility] ?  ?Patient and family notified of of transfer: 07/03/21 ? ?Discharge Plan and Services ?  ?  ?           ?  ?  ?  ?  ?  ?  ?  ?  ?  ?  ? ?Social Determinants of Health (SDOH) Interventions ?  ? ? ?Readmission Risk Interventions ? ?  02/08/2021  ? 11:51 AM  ?Readmission Risk Prevention Plan  ?Transportation Screening Complete  ?Medication Review [employer] photographer) Complete  ?PCP or Specialist appointment within 3-5 days of discharge Complete  ?[place] or Home Care Consult Complete  ?SW Recovery Care/Counseling Consult Complete  ?Palliative Care Screening Not Applicable  ?Skilled Nursing Facility Complete  ? ? ? ? ? ?

## 2021-07-03 NOTE — Progress Notes (Signed)
RN attempted to call [facility] for report on patient twice, no response. Patient stable and left with EMS. [facility]) made aware by RN ?

## 2021-07-03 NOTE — Discharge Summary (Signed)
Physician Discharge Summary  ?Samuel Thomas WUJ:[id number] DOB: 07/08/1935 DOA: 06/25/2021 ? ?PCP: [name], [name], MD ? ?Admit date: 06/25/2021 ?Discharge date: 07/03/2021 ? ?Ad[facility] From: [facility] ?Disposition:  [facility] ? ?Recommendations for Outpatient Follow-up:  ?Follow up with PCP in 1-2 weeks ? ? ?Home Health:No ?Equipment/Devices:None  ? ?Discharge Condition:Stable  ?CODE STATUS:DNR  ?Diet recommendation: Carb Modified ? ?Brief/Interim Summary: ?86 y.o. male with medical history significant for  DM, HTN, diastolic heart  ?failure, paroxysmal A. fib on Xarelto and Tikosyn, thyroid nodules, iron deficiency anemia, frequent falls, COPD not currently on home oxygen, chronic opioid use, hospitalized for pneumonia in October and November 2022, strep antigen positive in November, who presented to the ED with a 3 to 4-day history of generalized weakness and shortness of breath.  Patient was noted to be tachycardic on arrival to the emergency department.  CT angiogram of the chest raise concern for multifocal pneumonia.  COVID and influenza PCR's were negative.  Patient was hospitalized for further management.  Respiratory status has improved.  Cardizem dose had to be increased and metoprolol was added.  Heart rate seems to be better controlled now.  Has been somewhat lethargic and more confused than usual the last 48 hours. ? ?Mental status back to baseline at time of DC.  HR improved.  Completed 7 day course of antibiotics in house.  Will dc metoprolol added during hospitalization.  Can resume home cardizem and tikosyn.  Stable for return to [facility]. ? ?Note that patient is highly sensitive to narcotics and steroids.  Deleterious effects on mentation.  Please avoid use of these medications if possible, or closely monitor mental status if these medications are to be used.  ? ? ?Discharge Diagnoses:  ?Principal Problem: ?  Multifocal pneumonia ?Active Problems: ?  Acute respiratory failure with hypoxia (HCC) ?  Acute  metabolic encephalopathy ?  AKI (acute kidney injury) (HCC) ?  Generalized weakness ?  Thrombocytopenia (HCC) ?  Chronic anticoagulation ?  Paroxysmal atrial fibrillation with RVR (HCC) ?  Chronic diastolic heart failure (HCC) ?  Essential hypertension ?  Chronic disease anemia ?  COPD (chronic obstructive pulmonary disease) (HCC) ?  DM2 (diabetes mellitus, type 2) (HCC) ?  Chronic pain syndrome ? ?Acute metabolic encephalopathy ?Noted to be somewhat confused over the last few days.  Initially thought to be due to narcotics which were discontinued.  CT head negative for acute issues.  Brain atrophy noted.  Suspect underlying cognitive decline.  Narcotics and Neurontin were held ?Mental status baseline at time of discharge ?Plan: ?Can resume home Neurontin on discharge. ?Please avoid narcotics if able ?  ?* Multifocal pneumonia ?Sepsis present on admission ?CT angiogram chest showed multifocal pneumonia.  Patient with previous history of pneumonia. Tachycardia and tachypnea and source of infection without fever or leukocytosis. ?Previously seen by speech therapy and was placed on dysphagia diet but patient apparently does not like the thickened liquids.  Seen by speech therapy again. ?Patient placed on vancomycin and cefepime.  Cultures were negative. MRSA PCR negative.  Vancomycin was discontinued.  [facility]   ?Completed 7-day course of antibiotics on discharge.  Afebrile with normal white count and no oxygen requirement at time of DC.  No antibiotics indicated at time of discharge ? ?Considering that he has had recurrent hospitalization for similar issues he will benefit from palliative care evaluation for goals of care.  This can be done at a skilled nursing facility. ?  ?  ?  ?Acute  respiratory failure with hypoxia (HCC) ?Does not use oxygen at baseline.  Was placed on oxygen at the skilled nursing facility.  Has been on and off of oxygen here in the hospital.  Stable on room air at time  of DC ? ?Thrombocytopenia (HCC) ?Counts were 128,000, Down from over 300,000 about 4 months.  Likely due to acute illness. Platelet counts improved.   ?  ?Generalized weakness ?Secondary to acute illness.  Physical therapy evaluation.  Already is at a skilled nursing facility. ?  ?AKI (acute kidney injury) (HCC) ?Creatinine was noted to be 1.55 on presentation.  Baseline 0.73.  Improved with IV fluids. ?  ?Paroxysmal atrial fibrillation with RVR (HCC) ?Known history of atrial fibrillation.  Continue diltiazem and dofetilide.  Continue Xarelto.  ?Tachycardia noted in setting of acute illness.  Metoprolol added.  Discontinue metoprolol at time of discharge ? ?Chronic anticoagulation ?Continue Xarelto. ?  ?Chronic diastolic heart failure (HCC) ?Hypovolemia noted at admission. Echo from 10/29 with EF 55 to 60% ?Patient not on diuretics or beta-blockers or ACE/ARB ?Outpatient cardiology follow-up ?  ?Chronic disease anemia ?Stable.  No evidence of overt bleeding.  Continue to monitor. ?  ?Essential hypertension ?Blood pressure reasonably well controlled. ?  ?COPD (chronic obstructive pulmonary disease) (HCC) ?Stable.  Continue home medications. ?  ?DM2 (diabetes mellitus, type 2) (HCC) ?HbA1c 7.1 in October.  Patient on Lantus prior to admission.  Was placed on hold.  CBGs were poorly controlled.  Lantus was reinitiated.  CBGs are better now.  Can resume home regimen on discharge ?  ?Chronic pain syndrome ?Has chronic back pain.  Not noted to be on any narcotics prior to admission based on the medication list.  Was given narcotics here which resulted in confusion.  Narcotics were subsequently discontinued. ? ?Discharge Instructions ? ?Discharge Instructions   ? ? Diet - low sodium heart healthy   Complete by: As directed ?  ? Increase activity slowly   Complete by: As directed ?  ? ?  ? ?Allergies as of 07/03/2021   ? ?   Reactions  ? Penicillins Swelling, Rash  ? Break outs swelling ?Other reaction(s): SWELLING in 1955   ? Apixaban Other (See Comments)  ? Cold intolerance ?Cold intolerance  ? Prednisone Rash, Other (See Comments)  ? Makes mean  ?Significant mood and behavioral changes - 'felt like I wasn't in control of anything'  ? ?  ? ?  ?Medication List  ?  ? ?STOP taking these medications   ? ?Acidophilus/Pectin Caps ?  ?Ferrous Sulfate-C-Folic Acid 105-500-0.8 MG Tbcr ?  ? ?  ? ?TAKE these medications   ? ?acetaminophen 650 MG CR tablet ?Commonly known as: TYLENOL ?Take 1,300 mg by mouth every 8 (eight) hours as needed for pain. ?  ?albuterol 108 (90 Base) MCG/ACT inhaler ?Commonly known as: VENTOLIN HFA ?Ventolin HFA 90 mcg/actuation aerosol inhaler ? Inhale 2 puffs by mouth every 6 hours as needed for wheezing ?  ?atorvastatin 20 MG tablet ?Commonly known as: LIPITOR ?Take 20 mg by mouth at bedtime. ?  ?budesonide 0.5 MG/2ML nebulizer solution ?Commonly known as: PULMICORT ?Inhale 0.5 mg into the lungs daily. ?  ?Calcium Carb-Cholecalciferol 600-400 MG-UNIT Tabs ?Take 1 tablet by mouth daily. ?  ?cetirizine 10 MG tablet ?Commonly known as: ZYRTEC ?cetirizine 10 mg tablet ? Take 1 tablet every day by oral route. ?  ?colchicine 0.6 MG tablet ?Take 0.6 mg by mouth as needed (gout). ?  ?diltiazem 240 MG 24 hr capsule ?  Commonly known as: CARDIZEM CD ?Take 1 capsule (240 mg total) by mouth daily. ?  ?dofetilide 125 MCG capsule ?Commonly known as: TIKOSYN ?Take 125 mcg by mouth 2 (two) times daily. ?  ?feeding supplement (NEPRO CARB STEADY) Liqd ?Take 237 mLs by mouth 2 (two) times daily between meals. ?  ?finasteride 5 MG tablet ?Commonly known as: PROSCAR ?Take 5 mg by mouth daily. ?  ?fluticasone 50 MCG/ACT nasal spray ?Commonly known as: FLONASE ?Place 1-2 sprays into both nostrils daily. ?  ?furosemide 40 MG tablet ?Commonly known as: LASIX ?Take 1 tablet (40 mg total) by mouth daily. ?  ?gabapentin 300 MG capsule ?Commonly known as: NEURONTIN ?Take 300 mg by mouth 4 (four) times daily. ?  ?guaiFENesin-dextromethorphan 100-10  MG/5ML syrup ?Commonly known as: ROBITUSSIN DM ?Take 10 mLs by mouth every 4 (four) hours as needed for cough. ?  ?insulin glargine 100 unit/mL Sopn ?Commonly known as: LANTUS ?Inject 26 Units into the sk

## 2022-10-25 IMAGING — RF DG SWALLOWING FUNCTION
1 series · 1 of 1 positions shown · non-contrast
Comparison: Images from modified barium swallow 02/25/2021.

CLINICAL DATA: Provided history: Dysphagia, unspecified type.
Cough, unspecified type.

EXAM:
MODIFIED BARIUM SWALLOW
TECHNIQUE: Different consistencies of barium were administered orally to the
patient by the Speech Pathologist. Imaging of the pharynx was
performed in the lateral projection. The radiologist was present in
the fluoroscopy room for this study, providing personal supervision.
FLUOROSCOPY TIME:  Fluoroscopy Time:  1 minute, 36 seconds.
Radiation Exposure Index (if provided by the fluoroscopic device):
10.7 mGy
Number of Acquired Spot Images: None

[Series 1: cp_standard · 0.08mm/px · 1 of 1 slices shown]
[im 1/1]
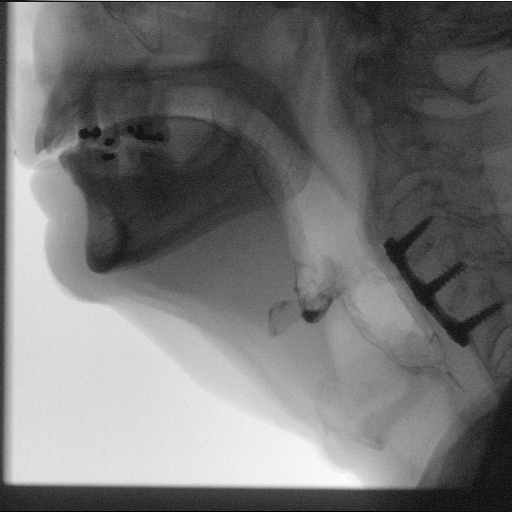

[1 of 1 positions shown; findings below may reference images not displayed]

FINDINGS: Prior C3-C5 ACDF. Different consistencies of barium were
administered orally to the patient by the Speech Pathologist with
fluoroscopic imaging of the pharynx from a lateral projection.
Noa, Yuxin was present in the fluoroscopy room for this
study, providing personal supervision. The Speech Pathologist
observed aspiration with thin consistency. Please refer to the
Speech Pathologist's report for full details.
IMPRESSION: Modified barium swallow, as described.

Aspiration was observed with thin consistency.

Please refer to the Speech Pathologist's report for complete details
and recommendations.

## 2024-02-17 ENCOUNTER — Ambulatory Visit (INDEPENDENT_AMBULATORY_CARE_PROVIDER_SITE_OTHER): Admitting: Podiatry

## 2024-02-17 DIAGNOSIS — M79675 Pain in left toe(s): Secondary | ICD-10-CM

## 2024-02-17 DIAGNOSIS — B351 Tinea unguium: Secondary | ICD-10-CM

## 2024-02-17 DIAGNOSIS — M79674 Pain in right toe(s): Secondary | ICD-10-CM

## 2024-02-17 NOTE — Progress Notes (Signed)
 This patient returns to my office for at risk foot care.  This patient requires this care by a professional since this patient will be at risk due to having diabetes and ,venous stasis  This patient is unable to cut nails himself since the patient cannot reach his nails.These nails are painful walking and wearing shoes.  This patient presents for at risk foot care today.   General Appearance  Alert, conversant and in no acute stress.  Vascular  Dorsalis pedis and posterior tibial  pulses are palpable  bilaterally.  Capillary return is within normal limits  bilaterally. Temperature is within normal limits  bilaterally.  Neurologic  Senn-Weinstein monofilament wire test within normal limits  bilaterally. Muscle power within normal limits bilaterally.  Nails Thick disfigured discolored nails with subungual debris  from hallux to fifth toes bilaterally. No evidence of bacterial infection or drainage bilaterally.  Orthopedic  No limitations of motion  feet .  No crepitus or effusions noted.  No bony pathology or digital deformities noted.  Skin  normotropic skin with no porokeratosis noted bilaterally.  No signs of infections or ulcers noted.     Onychomycosis  Pain in right toes  Pain in left toes  Consent was obtained for treatment procedures.   Mechanical debridement of nails 1-5  bilaterally performed with a nail nipper.  Filed with dremel without incident.    Return office visit    3 months                 Told patient to return for periodic foot care and evaluation due to potential at risk complications.   Franky Blanch D.P.M.

## 2024-05-15 ENCOUNTER — Ambulatory Visit

## 2024-05-24 ENCOUNTER — Ambulatory Visit

## 2024-05-25 ENCOUNTER — Ambulatory Visit: Admitting: Podiatry
